# Patient Record
Sex: Female | Born: 1960
Health system: Southern US, Community
[De-identification: ages and names within clinical notes are randomized; demographics above are authoritative.]

## PROBLEM LIST (undated history)

## (undated) ENCOUNTER — Ambulatory Visit (HOSPITAL_COMMUNITY): Admission: EM | Payer: Self-pay | Source: Home / Self Care

## (undated) DIAGNOSIS — J302 Other seasonal allergic rhinitis: Secondary | ICD-10-CM

## (undated) DIAGNOSIS — N289 Disorder of kidney and ureter, unspecified: Secondary | ICD-10-CM

## (undated) DIAGNOSIS — I10 Essential (primary) hypertension: Secondary | ICD-10-CM

## (undated) HISTORY — PX: TUBAL LIGATION: SHX77

---

## 2005-10-21 ENCOUNTER — Emergency Department (HOSPITAL_COMMUNITY): Admission: EM | Admit: 2005-10-21 | Discharge: 2005-10-21 | Payer: Self-pay | Admitting: Family Medicine

## 2006-05-24 ENCOUNTER — Emergency Department (HOSPITAL_COMMUNITY): Admission: EM | Admit: 2006-05-24 | Discharge: 2006-05-24 | Payer: Self-pay | Admitting: Family Medicine

## 2006-05-24 ENCOUNTER — Ambulatory Visit (HOSPITAL_COMMUNITY): Admission: RE | Admit: 2006-05-24 | Discharge: 2006-05-24 | Payer: Self-pay | Admitting: Family Medicine

## 2007-01-04 ENCOUNTER — Emergency Department (HOSPITAL_COMMUNITY): Admission: EM | Admit: 2007-01-04 | Discharge: 2007-01-04 | Payer: Self-pay | Admitting: Family Medicine

## 2007-01-16 ENCOUNTER — Emergency Department (HOSPITAL_COMMUNITY): Admission: EM | Admit: 2007-01-16 | Discharge: 2007-01-16 | Payer: Self-pay | Admitting: Family Medicine

## 2007-04-22 ENCOUNTER — Emergency Department (HOSPITAL_COMMUNITY): Admission: EM | Admit: 2007-04-22 | Discharge: 2007-04-22 | Payer: Self-pay | Admitting: Emergency Medicine

## 2007-10-24 ENCOUNTER — Encounter: Admission: RE | Admit: 2007-10-24 | Discharge: 2007-10-24 | Payer: Self-pay | Admitting: Nephrology

## 2008-02-09 ENCOUNTER — Emergency Department (HOSPITAL_COMMUNITY): Admission: EM | Admit: 2008-02-09 | Discharge: 2008-02-09 | Payer: Self-pay | Admitting: Emergency Medicine

## 2008-05-12 ENCOUNTER — Emergency Department (HOSPITAL_COMMUNITY): Admission: EM | Admit: 2008-05-12 | Discharge: 2008-05-12 | Payer: Self-pay | Admitting: Emergency Medicine

## 2008-07-18 ENCOUNTER — Emergency Department (HOSPITAL_COMMUNITY): Admission: EM | Admit: 2008-07-18 | Discharge: 2008-07-18 | Payer: Self-pay | Admitting: Emergency Medicine

## 2008-10-27 ENCOUNTER — Emergency Department (HOSPITAL_COMMUNITY): Admission: EM | Admit: 2008-10-27 | Discharge: 2008-10-27 | Payer: Self-pay | Admitting: Family Medicine

## 2009-10-01 ENCOUNTER — Emergency Department (HOSPITAL_COMMUNITY): Admission: EM | Admit: 2009-10-01 | Discharge: 2009-10-01 | Payer: Self-pay | Admitting: Family Medicine

## 2009-11-19 ENCOUNTER — Emergency Department (HOSPITAL_COMMUNITY): Admission: EM | Admit: 2009-11-19 | Discharge: 2009-11-19 | Payer: Self-pay | Admitting: Family Medicine

## 2011-05-28 LAB — POCT URINALYSIS DIP (DEVICE)
Bilirubin Urine: NEGATIVE
Glucose, UA: NEGATIVE
Nitrite: NEGATIVE
Protein, ur: NEGATIVE
Specific Gravity, Urine: 1.025
Urobilinogen, UA: 0.2
pH: 5.5

## 2011-05-28 LAB — WET PREP, GENITAL
Clue Cells Wet Prep HPF POC: NONE SEEN
Yeast Wet Prep HPF POC: NONE SEEN

## 2011-06-02 LAB — POCT URINALYSIS DIP (DEVICE)
Glucose, UA: NEGATIVE mg/dL
Protein, ur: NEGATIVE mg/dL
Urobilinogen, UA: 0.2 mg/dL (ref 0.0–1.0)

## 2012-03-26 ENCOUNTER — Encounter (HOSPITAL_COMMUNITY): Payer: Self-pay | Admitting: *Deleted

## 2012-03-26 ENCOUNTER — Emergency Department (INDEPENDENT_AMBULATORY_CARE_PROVIDER_SITE_OTHER)
Admission: EM | Admit: 2012-03-26 | Discharge: 2012-03-26 | Disposition: A | Discharge status: Home / Self Care | Payer: Self-pay | Source: Home / Self Care | Attending: Family Medicine | Admitting: Family Medicine

## 2012-03-26 DIAGNOSIS — K648 Other hemorrhoids: Secondary | ICD-10-CM

## 2012-03-26 MED ORDER — HYDROCORTISONE ACE-PRAMOXINE 1-1 % RE CREA: TOPICAL_CREAM | Freq: Two times a day (BID) | RECTAL | Status: AC

## 2012-03-26 MED ORDER — POLYETHYLENE GLYCOL 3350 17 GM/SCOOP PO POWD: 17.0000 g | Freq: Every day | ORAL | Status: AC

## 2012-03-26 NOTE — ED Notes (Signed)
Intermittent blood stool onset x 10 - 14 days - seen x 2  this week - denies constipation - bright red blood - denies pain

## 2012-03-26 NOTE — ED Provider Notes (Signed)
History     CSN: [id number]  Arrival date & time 03/26/12  1422   First MD Initiated Contact with Patient 03/26/12 1607      Chief Complaint  Patient presents with  . Rectal Bleeding    (Consider location/radiation/quality/duration/timing/severity/associated sxs/prior treatment) HPI Comments: 51 year old female with no significant past medical history. Comments complaining of stools stained with red blood streaks intermittently during the last 2 weeks. Denies straining or constipation. Has had a history of hemorrhoids in the past. Denies melena. Denies history of heavy alcohol intake, hematemesis or GI ulcers. Denies constant rectal pain or rectal pain with defecation. Has had intermittent rectal itchiness in the past but none during the last week. No weight loss. State her stools are soft and brown. No changes in her stool pattern. No abdominal pain.    History reviewed. No pertinent past medical history.  Past Surgical History  Procedure Date  . Tubal ligation     History reviewed. No pertinent family history.  History  Substance Use Topics  . Smoking status: Never Smoker   . Smokeless tobacco: Not on file  . Alcohol Use: Yes    OB History    Grav Para Term Preterm Abortions TAB SAB Ect Mult Living                  Review of Systems  Constitutional: Negative for fever, chills, appetite change and fatigue.       10 systems reviewed and  pertinent negative and positive symptoms are as per HPI.     Gastrointestinal: Positive for blood in stool and hematochezia. Negative for nausea, vomiting, abdominal pain, diarrhea, constipation and abdominal distention.  Genitourinary: Negative for dysuria and hematuria.  Skin: Negative for rash.  Neurological: Negative for dizziness and headaches.  All other systems reviewed and are negative.    Allergies  Review of patient's allergies indicates no known allergies.  Home Medications   Current Outpatient Rx  Name Route Sig  Dispense Refill  . POLYETHYLENE GLYCOL 3350 PO POWD Oral Take 17 g by mouth daily. 255 g 0  . HYDROCORTISONE ACE-PRAMOXINE 1-1 % RE CREA Rectal Place rectally 2 (two) times daily. 30 g 0    BP 165/92  Pulse 73  Temp 98.2 F (36.8 C) (Oral)  Resp 18  SpO2 100%  Physical Exam  Nursing note and vitals reviewed. Constitutional: She is oriented to person, place, and time. She appears well-developed and well-nourished. No distress.  HENT:  Head: Normocephalic and atraumatic.  Eyes: Conjunctivae are normal. Pupils are equal, round, and reactive to light. No scleral icterus.  Neck: Neck supple. No JVD present. No thyromegaly present.  Cardiovascular: Normal heart sounds.   Pulmonary/Chest: Effort normal and breath sounds normal.  Abdominal: Soft. Bowel sounds are normal. She exhibits no distension and no mass. There is no tenderness. There is no rebound and no guarding.  Genitourinary: Rectal exam shows external hemorrhoid and internal hemorrhoid. Rectal exam shows no fissure, no mass, no tenderness and anal tone normal. Guaiac negative stool.       Impress small  Internal hemorrhoid felt on digital rectal exam. Was not able to visualized internal hemorrhoid, bleeding points or fissures on anoscopy. Small non inflamed external hemorrhoid/skin tag at 5 O'clock.   Lymphadenopathy:    She has no cervical adenopathy.  Neurological: She is alert and oriented to person, place, and time.  Skin: No rash noted.    ED Course  Procedures (including critical care time)  Labs Reviewed -  No data to display No results found.   1. Internal hemorrhoid       MDM  Impress internal hemorrhoid on digital examination. I was not able to see that on anoscopy. No fissures. One small noninflamed external hemorrhoid observed. Encouraged healthy balanced diet, fiber rich diet, stool softener as needed. Propoxyphene for anal pruritus when necessary. GI specialist contact information provided to followup as  needed.        Randa Spike, MD 03/27/12 1143

## 2012-03-30 ENCOUNTER — Telehealth (HOSPITAL_COMMUNITY): Payer: Self-pay | Admitting: *Deleted

## 2012-03-30 NOTE — ED Notes (Signed)
[facility] faxed a note asking to switch Procotcream HC to Proctofoam aerosol.  Shown to Dr. Milinda Antis and she said it was OK to switch.  I wrote this on the note and faxed it back to the pharmacist and confirmation received. Roselyn Meier 03/30/2012

## 2012-11-10 ENCOUNTER — Encounter (HOSPITAL_COMMUNITY): Payer: Self-pay | Admitting: *Deleted

## 2012-11-10 ENCOUNTER — Emergency Department (INDEPENDENT_AMBULATORY_CARE_PROVIDER_SITE_OTHER)
Admission: EM | Admit: 2012-11-10 | Discharge: 2012-11-10 | Disposition: A | Discharge status: Home / Self Care | Payer: Self-pay | Source: Home / Self Care

## 2012-11-10 DIAGNOSIS — M79605 Pain in left leg: Secondary | ICD-10-CM

## 2012-11-10 DIAGNOSIS — M79609 Pain in unspecified limb: Secondary | ICD-10-CM

## 2012-11-10 MED ORDER — KETOROLAC TROMETHAMINE 60 MG/2ML IM SOLN
30.0000 mg | Freq: Once | INTRAMUSCULAR | Status: AC
  Administered 2012-11-10: 30 mg via INTRAMUSCULAR

## 2012-11-10 MED ORDER — METHYLPREDNISOLONE 4 MG PO KIT: PACK | ORAL | Status: DC

## 2012-11-10 MED ORDER — HYDROCODONE-ACETAMINOPHEN 5-325 MG PO TABS: 1.0000 | ORAL_TABLET | ORAL | Status: DC | PRN

## 2012-11-10 NOTE — ED Notes (Signed)
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

## 2012-11-10 NOTE — ED Provider Notes (Signed)
History     CSN: [id number]  Arrival date & time 11/10/12  1220   First MD Initiated Contact with Patient 11/10/12 1254      Chief Complaint  Patient presents with  . Leg Pain    (Consider location/radiation/quality/duration/timing/severity/associated sxs/prior treatment) HPI Comments: 52 year old female presents with acute pain in the left leg beginning yesterday morning. She awoke with mild soreness in the left lateral thigh and lesser to the hip. In the following hours through the night last night and today the pain has worsened. Located primarily in the lateral thigh. The pain interferes with ambulation, getting into and out of the car, and other movements. The pain is rated ranging from 5-9/10 on the pain scale. The pain is improved with sitting and worse when supine and bearing weight. She describes it as a sharp and achy pain. She has no history of similar type pain. Denies back pain or tenderness. She denies history of trauma, fall, MVC or other known injury.   History reviewed. No pertinent past medical history.  Past Surgical History  Procedure Laterality Date  . Tubal ligation      No family history on file.  History  Substance Use Topics  . Smoking status: Never Smoker   . Smokeless tobacco: Not on file  . Alcohol Use: Yes    OB History   Grav Para Term Preterm Abortions TAB SAB Ect Mult Living                  Review of Systems  Constitutional: Negative for fever, chills and activity change.  HENT: Negative.   Respiratory: Negative.   Cardiovascular: Negative.   Musculoskeletal:       As per HPI  Skin: Negative for color change, pallor and rash.  Neurological: Negative.  Negative for tremors, speech difficulty, weakness, numbness and headaches.  Psychiatric/Behavioral: Negative.     Allergies  Review of patient's allergies indicates no known allergies.  Home Medications   Current Outpatient Rx  Name  Route  Sig  Dispense  Refill  .  HYDROcodone-acetaminophen (NORCO/VICODIN) 5-325 MG per tablet   Oral   Take 1 tablet by mouth every 4 (four) hours as needed for pain.   15 tablet   0   . methylPREDNISolone (MEDROL DOSEPAK) 4 MG tablet      follow package directions   21 tablet   0     BP 89/43  Pulse 76  Temp(Src) 98.1 F (36.7 C) (Oral)  Resp 20  SpO2 99%  Physical Exam  Nursing note and vitals reviewed. Constitutional: She is oriented to person, place, and time. She appears well-developed and well-nourished. No distress.  HENT:  Head: Normocephalic and atraumatic.  Eyes: EOM are normal.  Neck: Normal range of motion. Neck supple.  Cardiovascular: Normal rate.   Pulmonary/Chest: Effort normal. No respiratory distress.  Musculoskeletal:  Tenderness primarily in the lateral thigh musculature. Lesser or absent in the quadricep muscles. No tenderness in the hip joint, buttock or back. No bony tenderness. She is able to lift her leg off the bed when sitting produces mild pain in the area above. While supine passive range of motion produces mild pain however when the knee and hip is flexed and externally rotated this produces severe pain in the left lateral thigh. Distal neurovascular motor sensory is intact.  Neurological: She is alert and oriented to person, place, and time. No cranial nerve deficit.  Negative straight leg raise  Skin: Skin is warm and dry.  Psychiatric: She has a normal mood and affect.    ED Course  Procedures (including critical care time)  Labs Reviewed - No data to display No results found.   1. Leg pain, lateral, left   2. Left leg pain       MDM  There are neuropathic and musculoskeletal components to the pain in her left leg. No history of trauma. The source of pain may be in the low back versus a localized area of muscle or nerve pain. Next appointment with the [facility] as soon as possible. May need further studies to determine origin. May return for any new symptoms problems  or worsening Norco 5 mg one every 4 hours when necessary pain #15 Medrol Dosepak #21       Janne Napoleon, NP 11/10/12 1328

## 2012-11-11 NOTE — ED Provider Notes (Signed)
Medical screening examination/treatment/procedure(s) were performed by non-physician practitioner and as supervising physician I was immediately available for consultation/collaboration.  Philipp Deputy, M.D.  Harden Mo, MD 11/11/12 [phone]

## 2013-04-14 ENCOUNTER — Emergency Department (HOSPITAL_COMMUNITY)
Admission: EM | Admit: 2013-04-14 | Discharge: 2013-04-14 | Disposition: A | Discharge status: Home / Self Care | Payer: Self-pay | Attending: Emergency Medicine | Admitting: Emergency Medicine

## 2013-04-14 ENCOUNTER — Encounter (HOSPITAL_COMMUNITY): Payer: Self-pay | Admitting: Emergency Medicine

## 2013-04-14 DIAGNOSIS — J3489 Other specified disorders of nose and nasal sinuses: Secondary | ICD-10-CM | POA: Insufficient documentation

## 2013-04-14 DIAGNOSIS — J069 Acute upper respiratory infection, unspecified: Secondary | ICD-10-CM

## 2013-04-14 DIAGNOSIS — R05 Cough: Secondary | ICD-10-CM | POA: Insufficient documentation

## 2013-04-14 DIAGNOSIS — R059 Cough, unspecified: Secondary | ICD-10-CM | POA: Insufficient documentation

## 2013-04-14 MED ORDER — SALINE SPRAY 0.65 % NA SOLN: 1.0000 | NASAL | Status: DC | PRN

## 2013-04-14 NOTE — ED Provider Notes (Signed)
CSN: [id number]     Arrival date & time 04/14/13  1107 History     First MD Initiated Contact with Patient 04/14/13 1117     Chief Complaint  Patient presents with  . Otalgia   (Consider location/radiation/quality/duration/timing/severity/associated sxs/prior Treatment) HPI Comments: Patient is a 52 year old female presented to the emergency department with 2 days of worsening dry cough, nasal congestion with rhinorrhea, intermittent sharp right ear pain. Patient rates her ear pain at worst 10 out of 10. She denies any drainage from her ear. She denies any alleviating or aggravating factors. She denies trying any OTC medications for cough and cold symptoms. She denies any fevers or chills.  Patient is a 52 y.o. female presenting with ear pain.  Otalgia Associated symptoms: congestion, cough and rhinorrhea   Associated symptoms: no ear discharge, no fever, no headaches and no sore throat     History reviewed. No pertinent past medical history. Past Surgical History  Procedure Laterality Date  . Tubal ligation    . Tubal ligation     No family history on file. History  Substance Use Topics  . Smoking status: Never Smoker   . Smokeless tobacco: Not on file  . Alcohol Use: Yes   OB History   Grav Para Term Preterm Abortions TAB SAB Ect Mult Living                 Review of Systems  Constitutional: Negative for fever.  HENT: Positive for ear pain, congestion and rhinorrhea. Negative for sore throat and ear discharge.   Respiratory: Positive for cough. Negative for shortness of breath.   Cardiovascular: Negative for chest pain.  Neurological: Negative for headaches.    Allergies  Review of patient's allergies indicates no known allergies.  Home Medications   Current Outpatient Rx  Name  Route  Sig  Dispense  Refill  . sodium chloride (OCEAN) 0.65 % SOLN nasal spray   Nasal   Place 1 spray into the nose as needed for congestion.   60 mL   0    BP 147/98  Pulse 93   Temp(Src) 98 F (36.7 C) (Oral)  SpO2 96% Physical Exam  Constitutional: She is oriented to person, place, and time. She appears well-developed and well-nourished. No distress.  HENT:  Head: Normocephalic and atraumatic.  Right Ear: Tympanic membrane and external ear normal. No mastoid tenderness. Tympanic membrane is not injected, not scarred, not perforated, not erythematous, not retracted and not bulging. Tympanic membrane mobility is normal.  Left Ear: Tympanic membrane and external ear normal. No mastoid tenderness. Tympanic membrane is not injected, not scarred, not perforated, not erythematous, not retracted and not bulging. Tympanic membrane mobility is normal.  Nose: Nose normal.  Mouth/Throat: Oropharynx is clear and moist. No oropharyngeal exudate.  Eyes: Conjunctivae are normal.  Neck: Normal range of motion. Neck supple.  Pulmonary/Chest: Effort normal and breath sounds normal. No respiratory distress.  Lymphadenopathy:    She has no cervical adenopathy.  Neurological: She is alert and oriented to person, place, and time.  Skin: Skin is warm and dry. She is not diaphoretic.  Psychiatric: She has a normal mood and affect.    ED Course   Procedures (including critical care time)  Labs Reviewed - No data to display No results found. 1. URI (upper respiratory infection)     MDM  Patients symptoms are consistent with URI, likely viral etiology. Discussed that antibiotics are not indicated for viral infections. Pt will be discharged with  symptomatic treatment.  Verbalizes understanding and is agreeable with plan. Pt is hemodynamically stable & in NAD prior to dc. Patient was advised to follow up with PCP in 1-2 days. Patient is agreeable to plan. Patient is stable at time of discharge      Harlow Mares, PA-C 04/14/13 1431

## 2013-04-14 NOTE — ED Notes (Signed)
Started 2 days ago with URI and now c/o right ear pain, "sharp" at times.

## 2013-04-14 NOTE — ED Provider Notes (Signed)
Medical screening examination/treatment/procedure(s) were performed by non-physician practitioner and as supervising physician I was immediately available for consultation/collaboration.  Virgel Manifold, MD 04/14/13 [phone]

## 2013-11-14 ENCOUNTER — Encounter (HOSPITAL_COMMUNITY): Payer: Self-pay | Admitting: Emergency Medicine

## 2013-11-14 ENCOUNTER — Emergency Department (HOSPITAL_COMMUNITY)
Admission: EM | Admit: 2013-11-14 | Discharge: 2013-11-14 | Disposition: A | Discharge status: Home / Self Care | Payer: Self-pay | Attending: Emergency Medicine | Admitting: Emergency Medicine

## 2013-11-14 DIAGNOSIS — R030 Elevated blood-pressure reading, without diagnosis of hypertension: Secondary | ICD-10-CM | POA: Insufficient documentation

## 2013-11-14 DIAGNOSIS — R21 Rash and other nonspecific skin eruption: Secondary | ICD-10-CM | POA: Insufficient documentation

## 2013-11-14 DIAGNOSIS — IMO0001 Reserved for inherently not codable concepts without codable children: Secondary | ICD-10-CM

## 2013-11-14 MED ORDER — TERBINAFINE HCL 1 % EX CREA: 1.0000 "application " | TOPICAL_CREAM | Freq: Two times a day (BID) | CUTANEOUS | Status: DC

## 2013-11-14 NOTE — ED Provider Notes (Signed)
CSN: [id number]     Arrival date & time 11/14/13  1608 History  This chart was scribed for non-physician practitioner, Jeannett Senior, PA-C working with Ezequiel Essex, MD by Frederich Balding, ED scribe. This patient was seen in room TR08C/TR08C and the patient's care was started at 5:54 PM.    Chief Complaint  Patient presents with  . Rash   The history is provided by the patient. No language interpreter was used.   HPI Comments: Rebecca Marquez is a 53 y.o. female who presents to the Emergency Department complaining of a worsening dry, scaly rash that started 4 weeks ago. She states it started on her lower back and is now on her abdomen and arms. The rash only itches when she sweats. Pt has been using an OTC anti-itch cream with no relief. She states her coworker has a similar rash. Denies history of hypertension.  History reviewed. No pertinent past medical history. Past Surgical History  Procedure Laterality Date  . Tubal ligation    . Tubal ligation     No family history on file. History  Substance Use Topics  . Smoking status: Never Smoker   . Smokeless tobacco: Not on file  . Alcohol Use: Yes   OB History   Grav Para Term Preterm Abortions TAB SAB Ect Mult Living                 Review of Systems  Skin: Positive for rash.  All other systems reviewed and are negative.   Allergies  Review of patient's allergies indicates no known allergies.  Home Medications   Current Outpatient Rx  Name  Route  Sig  Dispense  Refill  . sodium chloride (OCEAN) 0.65 % SOLN nasal spray   Nasal   Place 1 spray into the nose as needed for congestion.   60 mL   0    BP 175/118  Pulse 107  Temp(Src) 98.5 F (36.9 C) (Oral)  Resp 18  SpO2 98%  Physical Exam  Nursing note and vitals reviewed. Constitutional: She is oriented to person, place, and time. She appears well-developed and well-nourished. No distress.  HENT:  Head: Normocephalic and atraumatic.  No rash over oral  mucosa  Eyes: EOM are normal.  Neck: Neck supple. No tracheal deviation present.  Cardiovascular: Normal rate.   Pulmonary/Chest: Effort normal. No respiratory distress.  Musculoskeletal: Normal range of motion.  Neurological: She is alert and oriented to person, place, and time.  Skin: Skin is warm and dry.  Scaly, hyperpigmented, annular rash to the abdomen and lower back with few spots over bilateral forearms. No palms or soles involvement  Psychiatric: She has a normal mood and affect. Her behavior is normal.    ED Course  Procedures (including critical care time)  DIAGNOSTIC STUDIES: Oxygen Saturation is 98% on RA, normal by my interpretation.    COORDINATION OF CARE: 5:58 PM-Discussed treatment plan which includes an anti-fungal cream with pt at bedside and pt agreed to plan.   Labs Review Labs Reviewed - No data to display Imaging Review No results found.   EKG Interpretation None      MDM   Final diagnoses:  Rash  Elevated blood pressure    Patient in emergency department with a scaly, annular, hyperpigmented rash to the lower abdomen and lower back. Rash is suspicious for an use corporis. It is however not extremely itchy. I have considered this to be pityriasis rosea or eczema rash. We'll continue hydrocortisone cream, Benadryl  cream, and we'll add Lamisil. She is to followup with her primary care Dr.  Her blood pressure is elevated in emergency department. She does not have history of hypertension. She is asymptomatic for her blood pressure. Have instructed her to followup with her doctor in 2 weeks to recheck it. She currently does not have a primary care doctor, and i recommended her several places.   Filed Vitals:   11/14/13 1626 11/14/13 1805  BP: 175/118 178/113  Pulse: 107 76  Temp: 98.5 F (36.9 C) 98.7 F (37.1 C)  TempSrc: Oral Oral  Resp: 18 17  SpO2: 98% 100%    I personally performed the services described in this documentation, which was  scribed in my presence. The recorded information has been reviewed and is accurate.   Renold Genta, PA-C 11/14/13 1811

## 2013-11-14 NOTE — Discharge Instructions (Signed)
Your rash is either fungal or due to pityriasis rosea. Apply current cream twice a day. Add lamisil cream. Also use moisturizing lotion after bathing daily. Avoid scratching it. Take benadryl orally as needed for itching. Follow up with your doctor for recheck.    Rash A rash is a change in the color or texture of your skin. There are many different types of rashes. You may have other problems that accompany your rash. CAUSES   Infections.  Allergic reactions. This can include allergies to pets or foods.  Certain medicines.  Exposure to certain chemicals, soaps, or cosmetics.  Heat.  Exposure to poisonous plants.  Tumors, both cancerous and noncancerous. SYMPTOMS   Redness.  Scaly skin.  Itchy skin.  Dry or cracked skin.  Bumps.  Blisters.  Pain. DIAGNOSIS  Your caregiver may do a physical exam to determine what type of rash you have. A skin sample (biopsy) may be taken and examined under a microscope. TREATMENT  Treatment depends on the type of rash you have. Your caregiver may prescribe certain medicines. For serious conditions, you may need to see a skin doctor (dermatologist). HOME CARE INSTRUCTIONS   Avoid the substance that caused your rash.  Do not scratch your rash. This can cause infection.  You may take cool baths to help stop itching.  Only take over-the-counter or prescription medicines as directed by your caregiver.  Keep all follow-up appointments as directed by your caregiver. SEEK IMMEDIATE MEDICAL CARE IF:  You have increasing pain, swelling, or redness.  You have a fever.  You have new or severe symptoms.  You have body aches, diarrhea, or vomiting.  Your rash is not better after 3 days. MAKE SURE YOU:  Understand these instructions.  Will watch your condition.  Will get help right away if you are not doing well or get worse. Document Released: 08/07/2002 Document Revised: 11/09/2011 Document Reviewed: 06/01/2011 [facility] Patient  Information 2014 [place], [place].

## 2013-11-14 NOTE — ED Notes (Signed)
Pt is here with dry scaly rash that started on lower back spread to abdomen and then to arms.

## 2013-11-14 NOTE — ED Notes (Signed)
Pt denies SOB, airway is patent without swelling at this time.

## 2013-11-14 NOTE — ED Provider Notes (Signed)
Medical screening examination/treatment/procedure(s) were performed by non-physician practitioner and as supervising physician I was immediately available for consultation/collaboration.   EKG Interpretation None        Ezequiel Essex, MD 11/14/13 [phone]

## 2013-11-15 ENCOUNTER — Observation Stay (HOSPITAL_COMMUNITY)
Admission: EM | Admit: 2013-11-15 | Discharge: 2013-11-16 | Disposition: A | Discharge status: Home / Self Care | Payer: Self-pay | Attending: Internal Medicine | Admitting: Internal Medicine

## 2013-11-15 ENCOUNTER — Encounter (HOSPITAL_COMMUNITY): Payer: Self-pay | Admitting: Emergency Medicine

## 2013-11-15 DIAGNOSIS — I129 Hypertensive chronic kidney disease with stage 1 through stage 4 chronic kidney disease, or unspecified chronic kidney disease: Principal | ICD-10-CM | POA: Insufficient documentation

## 2013-11-15 DIAGNOSIS — I161 Hypertensive emergency: Secondary | ICD-10-CM

## 2013-11-15 DIAGNOSIS — A5900 Urogenital trichomoniasis, unspecified: Secondary | ICD-10-CM | POA: Insufficient documentation

## 2013-11-15 DIAGNOSIS — N189 Chronic kidney disease, unspecified: Secondary | ICD-10-CM | POA: Insufficient documentation

## 2013-11-15 DIAGNOSIS — Z87891 Personal history of nicotine dependence: Secondary | ICD-10-CM | POA: Insufficient documentation

## 2013-11-15 DIAGNOSIS — A599 Trichomoniasis, unspecified: Secondary | ICD-10-CM

## 2013-11-15 DIAGNOSIS — I10 Essential (primary) hypertension: Secondary | ICD-10-CM

## 2013-11-15 DIAGNOSIS — N184 Chronic kidney disease, stage 4 (severe): Secondary | ICD-10-CM

## 2013-11-15 DIAGNOSIS — B354 Tinea corporis: Secondary | ICD-10-CM | POA: Insufficient documentation

## 2013-11-15 DIAGNOSIS — E785 Hyperlipidemia, unspecified: Secondary | ICD-10-CM | POA: Insufficient documentation

## 2013-11-15 HISTORY — DX: Other seasonal allergic rhinitis: J30.2

## 2013-11-15 HISTORY — DX: Essential (primary) hypertension: I10

## 2013-11-15 LAB — URINALYSIS, ROUTINE W REFLEX MICROSCOPIC
Bilirubin Urine: NEGATIVE
GLUCOSE, UA: NEGATIVE mg/dL
KETONES UR: NEGATIVE mg/dL
NITRITE: POSITIVE — AB
PROTEIN: 100 mg/dL — AB
SPECIFIC GRAVITY, URINE: 1.02 (ref 1.005–1.030)
UROBILINOGEN UA: 0.2 mg/dL (ref 0.0–1.0)
pH: 6 (ref 5.0–8.0)

## 2013-11-15 LAB — BASIC METABOLIC PANEL
BUN: 27 mg/dL — AB (ref 6–23)
CALCIUM: 9.2 mg/dL (ref 8.4–10.5)
CO2: 24 mEq/L (ref 19–32)
CREATININE: 2.07 mg/dL — AB (ref 0.50–1.10)
Chloride: 102 mEq/L (ref 96–112)
GFR calc non Af Amer: 27 mL/min — ABNORMAL LOW (ref >90)
GFR, EST AFRICAN AMERICAN: 31 mL/min — AB (ref >90)
Glucose, Bld: 94 mg/dL (ref 70–99)
Potassium: 3.6 mEq/L — ABNORMAL LOW (ref 3.7–5.3)
Sodium: 139 mEq/L (ref 137–147)

## 2013-11-15 LAB — URINE MICROSCOPIC-ADD ON

## 2013-11-15 LAB — CBC
HCT: 35 % — ABNORMAL LOW (ref 36.0–46.0)
Hemoglobin: 12.4 g/dL (ref 12.0–15.0)
MCH: 32.2 pg (ref 26.0–34.0)
MCHC: 35.4 g/dL (ref 30.0–36.0)
MCV: 90.9 fL (ref 78.0–100.0)
Platelets: 227 10*3/uL (ref 150–400)
RBC: 3.85 MIL/uL — AB (ref 3.87–5.11)
RDW: 15.2 % (ref 11.5–15.5)
WBC: 4.3 10*3/uL (ref 4.0–10.5)

## 2013-11-15 LAB — HEPATIC FUNCTION PANEL
ALK PHOS: 124 U/L — AB (ref 39–117)
ALT: 14 U/L (ref 0–35)
AST: 20 U/L (ref 0–37)
Albumin: 3.2 g/dL — ABNORMAL LOW (ref 3.5–5.2)
Bilirubin, Direct: <0.2 mg/dL (ref 0.0–0.3)
TOTAL PROTEIN: 6.9 g/dL (ref 6.0–8.3)
Total Bilirubin: 0.3 mg/dL (ref 0.3–1.2)

## 2013-11-15 LAB — CREATININE, URINE, RANDOM: Creatinine, Urine: 186.37 mg/dL

## 2013-11-15 LAB — SODIUM, URINE, RANDOM: Sodium, Ur: 115 mEq/L

## 2013-11-15 LAB — PREGNANCY, URINE: Preg Test, Ur: NEGATIVE

## 2013-11-15 LAB — TSH: TSH: 0.636 u[IU]/mL (ref 0.350–4.500)

## 2013-11-15 MED ORDER — ACETAMINOPHEN 650 MG RE SUPP: 650.0000 mg | Freq: Four times a day (QID) | RECTAL | Status: DC | PRN

## 2013-11-15 MED ORDER — ONDANSETRON HCL 4 MG/2ML IJ SOLN: 4.0000 mg | Freq: Three times a day (TID) | INTRAMUSCULAR | Status: DC | PRN

## 2013-11-15 MED ORDER — METOPROLOL TARTRATE 1 MG/ML IV SOLN
10.0000 mg | Freq: Four times a day (QID) | INTRAVENOUS | Status: DC
  Administered 2013-11-15 – 2013-11-16 (×4): 10 mg via INTRAVENOUS
  Filled 2013-11-15 (×8): qty 10

## 2013-11-15 MED ORDER — POTASSIUM CHLORIDE CRYS ER 20 MEQ PO TBCR
40.0000 meq | EXTENDED_RELEASE_TABLET | Freq: Once | ORAL | Status: AC
  Administered 2013-11-15: 40 meq via ORAL
  Filled 2013-11-15: qty 2

## 2013-11-15 MED ORDER — HYDRALAZINE HCL 20 MG/ML IJ SOLN
5.0000 mg | INTRAMUSCULAR | Status: DC | PRN
  Administered 2013-11-15: 5 mg via INTRAVENOUS
  Filled 2013-11-15: qty 1

## 2013-11-15 MED ORDER — METRONIDAZOLE 500 MG PO TABS
2000.0000 mg | ORAL_TABLET | Freq: Once | ORAL | Status: AC
  Administered 2013-11-15: 2000 mg via ORAL
  Filled 2013-11-15: qty 4

## 2013-11-15 MED ORDER — ONDANSETRON HCL 4 MG/2ML IJ SOLN
4.0000 mg | Freq: Three times a day (TID) | INTRAMUSCULAR | Status: DC | PRN
  Administered 2013-11-15: 4 mg via INTRAVENOUS
  Filled 2013-11-15: qty 2

## 2013-11-15 MED ORDER — SODIUM CHLORIDE 0.9 % IV SOLN
INTRAVENOUS | Status: AC
  Administered 2013-11-15: 10:00:00 via INTRAVENOUS

## 2013-11-15 MED ORDER — KETOCONAZOLE 2 % EX CREA
TOPICAL_CREAM | Freq: Every day | CUTANEOUS | Status: DC
  Administered 2013-11-15 – 2013-11-16 (×2): via TOPICAL
  Filled 2013-11-15 (×2): qty 15

## 2013-11-15 MED ORDER — ACETAMINOPHEN 325 MG PO TABS
650.0000 mg | ORAL_TABLET | Freq: Four times a day (QID) | ORAL | Status: DC | PRN
  Administered 2013-11-15 – 2013-11-16 (×4): 650 mg via ORAL
  Filled 2013-11-15 (×4): qty 2

## 2013-11-15 MED ORDER — HEPARIN SODIUM (PORCINE) 5000 UNIT/ML IJ SOLN
5000.0000 [IU] | Freq: Three times a day (TID) | INTRAMUSCULAR | Status: DC
Start: 2013-11-15 — End: 2013-11-16
  Administered 2013-11-15 – 2013-11-16 (×3): 5000 [IU] via SUBCUTANEOUS
  Filled 2013-11-15 (×6): qty 1

## 2013-11-15 MED ORDER — LABETALOL HCL 5 MG/ML IV SOLN
20.0000 mg | Freq: Once | INTRAVENOUS | Status: AC
  Administered 2013-11-15: 20 mg via INTRAVENOUS
  Filled 2013-11-15: qty 4

## 2013-11-15 NOTE — Progress Notes (Signed)
Patient arrived on unit from ED.  Vital signs stable.  Denies any questions or concerns.  Patient refuses flu and pneumonia vaccines at this time.  Will continue to monitor.

## 2013-11-15 NOTE — ED Provider Notes (Signed)
CSN: [id number]     Arrival date & time 11/15/13  [id number] History   First MD Initiated Contact with Patient 11/15/13 [phone]     Chief Complaint  Patient presents with  . Hypertension     (Consider location/radiation/quality/duration/timing/severity/associated sxs/prior Treatment) Patient is a 53 y.o. female presenting with hypertension. The history is provided by the patient. No language interpreter was used.  Hypertension Pertinent negatives include no abdominal pain, chest pain, chills, fatigue, headaches, nausea, numbness, vomiting or weakness.  This is a 52yo AAF w/ no significant PMH who presents to ED w/ c/o elevated blood pressure this morning. Patient was here in the ED yesterday for a rash and found to have blood pressure 175/118. She was asymptomatic and told to follow up with her PCP for treatment of HTN. No hx of HTN. Patient reports she was at [facility] this morning and checked her blood pressure, which was 190s/137. She will not have insurance until April so is unable to be seen by PCP until that time, which is why she came to the ED. She denies CP, SOB, HA, vision changes, focal neurologic symptoms, lightheadedness, leg swelling, palpitations, N/V/D, abd pain. She does endorse having increased urinary frequency, denies dysuria or hematuria.    History reviewed. No pertinent past medical history. Past Surgical History  Procedure Laterality Date  . Tubal ligation    . Tubal ligation     No family history on file. History  Substance Use Topics  . Smoking status: Never Smoker   . Smokeless tobacco: Not on file  . Alcohol Use: Yes   OB History   Grav Para Term Preterm Abortions TAB SAB Ect Mult Living                 Review of Systems  Constitutional: Negative for chills and fatigue.  Eyes: Negative for visual disturbance.  Respiratory: Negative for shortness of breath.   Cardiovascular: Negative for chest pain, palpitations and leg swelling.  Gastrointestinal: Negative for  nausea, vomiting, abdominal pain and diarrhea.  Genitourinary: Positive for frequency. Negative for dysuria and hematuria.  Neurological: Negative for syncope, facial asymmetry, speech difficulty, weakness, light-headedness, numbness and headaches.  All other systems reviewed and are negative.      Allergies  Review of patient's allergies indicates no known allergies.  Home Medications   Current Outpatient Rx  Name  Route  Sig  Dispense  Refill  . terbinafine (LAMISIL AT) 1 % cream   Topical   Apply 1 application topically 2 (two) times daily.   30 g   0    BP 199/168  Pulse 107  Temp(Src) 97.8 F (36.6 C) (Oral)  Resp 18  Ht 5\' 3"  (1.6 m)  Wt 124 lb (56.246 kg)  BMI 21.97 kg/m2  SpO2 99% Physical Exam  Nursing note and vitals reviewed. Constitutional: She is oriented to person, place, and time. She appears well-developed and well-nourished. No distress.  HENT:  Head: Normocephalic and atraumatic.  Mouth/Throat: Oropharynx is clear and moist.  Eyes: Conjunctivae and EOM are normal. Pupils are equal, round, and reactive to light.  Neck: Normal range of motion. Neck supple.  Cardiovascular: Normal rate and regular rhythm.   Pulmonary/Chest: Effort normal and breath sounds normal.  Abdominal: Soft. Bowel sounds are normal. She exhibits no distension. There is no tenderness.  Musculoskeletal: She exhibits no edema.  Neurological: She is alert and oriented to person, place, and time. No cranial nerve deficit.  Skin: Skin is warm and dry. Rash  noted.  Erythematous annular macules/patches w/ scale on leading edge on abdomen and back  Psychiatric: She has a normal mood and affect.    ED Course  Procedures (including critical care time) Labs Review Labs Reviewed  URINALYSIS, ROUTINE W REFLEX MICROSCOPIC  BASIC METABOLIC PANEL   Imaging Review No results found.   EKG Interpretation None      MDM   This is a 53yo AAF w/ no PMH who presents with asymptomatic  hypertension. Neuro exam is nonfocal, so unlikely to have any neurological invomement. She does not have s/s ACS. Will obtain BMP to assess renal function. She c/o urinary frequency as an isolated GU complaint. Will check a UA.   8:56 AM Patient's blood pressure remains elevated to 160-170/116. Remains asymptomatic. Patient's BMP showed Cr 2.07, unknown baseline, but I suspect this represents AKI 2/2 hypertensive emergency given she has no other known comorbidities. No other signs of end organ damage. UA wnl. I spoke with Dr. Elnora Morrison with [facility], they agree to admit patient.   Rebecca Eaton, MD 11/15/13 [phone]

## 2013-11-15 NOTE — ED Notes (Signed)
Pt comes to [facility] with complaints of HTN 191/137 checked at [facility] at 0600. Now BP is 199/168. Denies HA or slurred speech. Pt ambulatory, steady gait, grip strong and equal bilaterally. Denies pain right now. A&OX4.

## 2013-11-15 NOTE — ED Notes (Signed)
Called to give report, put on hold X 6 mins.

## 2013-11-15 NOTE — Progress Notes (Signed)
MD notified that patient continues to experience headache and sinus pressure, despite PRN acetaminophen administration.  No additional orders placed at this time.  Will continue to monitor.

## 2013-11-15 NOTE — H&P (Signed)
Date: 11/15/2013               Patient Name:  Rebecca Marquez MRN: [id number]  DOB: 30-Oct-1960 Age / Sex: 53 y.o., female   PCP: No Pcp Per Patient         Medical Service: Internal Medicine Teaching Service         Attending Physician: Dr. Madilyn Fireman, MD    First Contact: Dr. Margart Sickles Pager: [phone]  Second Contact: Dr. Algis Liming Pager: [phone]       After Hours (After 5p/  First Contact Pager: [phone]  weekends / holidays): Second Contact Pager: [phone]   Chief Complaint: Hypertension  History of Present Illness: The patient is a 53 yo woman, with no known PMH, though with no PCP, presenting with hypertension.  The patient presented after discovering that she had a BP of 190/130's on a [employer] BP cuff.  She has no known history of HTN, but was seen in the ED for a rash yesterday, and was noted to have BP's in the 170's/110's.  The patient is presently asymptomatic, with no nausea, vomiting, abd pain, visual changes, or confusion.  She does note a mild bilateral frontal headache, which is similar to her chronic headaches, but which only started after arrival in the ED today.  In the ED, she was found to have a Cr of 2.1, with no prior comparison, but no reported history of renal disease.  She notes occasional NSAID use for headaches, but none in the last week.  She does note poor PO intake for the last 2-3 weeks due to loss of appetite secondary to stress.  Of note, the patient was seen in the ED one day prior to admission for a rash on her abdomen, chest, and back, described as a mildly itchy, scaly rash that has been progressively growing over the last 1 month.  Meds: Current Facility-Administered Medications  Medication Dose Route Frequency Provider Last Rate Last Dose  . ondansetron (ZOFRAN) injection 4 mg  4 mg Intravenous Q8H PRN Rebecca Eaton, MD       Current Outpatient Prescriptions  Medication Sig Dispense Refill  . terbinafine (LAMISIL AT) 1 % cream Apply 1 application  topically 2 (two) times daily.  30 g  0    Allergies: Allergies as of 11/15/2013  . (No Known Allergies)   History reviewed. No pertinent past medical history. Past Surgical History  Procedure Laterality Date  . Tubal ligation    . Tubal ligation     No family history on file. History   Social History  . Marital Status: Legally Separated    Spouse Name: N/A    Number of Children: N/A  . Years of Education: N/A   Occupational History  . Not on file.   Social History Main Topics  . Smoking status: Former Smoker -- 0.50 packs/day for 20 years    Types: Cigarettes    Quit date: 08/31/2001  . Smokeless tobacco: Not on file  . Alcohol Use: No  . Drug Use: Yes    Special: Marijuana  . Sexual Activity: Not on file   Other Topics Concern  . Not on file   Social History Narrative  . No narrative on file    Review of Systems: General: no fevers, chills Skin: see HPI HEENT: no blurry vision, hearing changes, sore throat Pulm: no dyspnea, coughing, wheezing CV: no chest pain, palpitations, shortness of breath Abd: no abdominal pain, nausea/vomiting, diarrhea/constipation GU: +urinary frequency, +vaginal dryness,  no dysuria, hematuria, polyuria, vaginal itching/burning Ext: no arthralgias, myalgias Neuro: no weakness, numbness, or tingling  Physical Exam: Blood pressure 172/114, pulse 82, temperature 97.8 F (36.6 C), temperature source Oral, resp. rate 20, height 5\' 3"  (1.6 m), weight 124 lb (56.246 kg), SpO2 100.00%. General: alert, cooperative, and in no apparent distress HEENT: PERRL, EOMI, unable to fully visualize fundi Neck: supple Lungs: clear to ascultation bilaterally, normal work of respiration, no wheezes, rales, ronchi Heart: regular rate and rhythm, no murmurs, gallops, or rubs Abdomen: soft, non-tender, non-distended, normal bowel sounds.  Hyperpigmented, mildly raised, circular areas of scaling, with central clearing noted diffusely on abdomen and  back Extremities: no cyanosis, clubbing, or edema Neurologic: alert & oriented X3, cranial nerves II-XII intact, strength grossly intact, sensation intact to light touch   Lab results: Basic Metabolic Panel:  Recent Labs  11/15/13 0738  NA 139  K 3.6*  CL 102  CO2 24  GLUCOSE 94  BUN 27*  CREATININE 2.07*  CALCIUM 9.2   Urinalysis:  Recent Labs  11/15/13 0749  COLORURINE YELLOW  LABSPEC 1.020  PHURINE 6.0  GLUCOSEU NEGATIVE  HGBUR SMALL*  BILIRUBINUR NEGATIVE  KETONESUR NEGATIVE  PROTEINUR 100*  UROBILINOGEN 0.2  NITRITE POSITIVE*  LEUKOCYTESUR MODERATE*  Trichomonas present on UA  Imaging results:  No results found.  Other results: EKG: NSR, no acute ST abnormalities, LVH  Assessment & Plan by Problem:  # Accelerated Hypertension - The patient presents with BP max 181/116.  I suspect that this represents chronic hypertension, given lack of access to healthcare, similar values 1 day prior to admission, and EKG showing LVH.  The patient also has a Cr = 2.1, though it's unclear whether this is acute vs chronic (see below).  The patient has no other evidence of end-organ dysfunction. -goal BP reduction today is to around 145/93 (reduction of 20% from maximum) -s/p 20 mg labetalol in ED -start metoprolol 10 mg IV q6 -Hydralazine 5 mg q4 PRN only if SBP > 160 or DBP > 100 -monitor daily BMET's for Cr -can likely transition to PO BP medications in AM -check TSH  # AKI vs CKD - The patient presents with a Cr = 2.1, with no prior Cr on record.  AKI could represent prerenal azotemia (poor PO intake x2-3 weeks, though BUN/Cr < 20) vs hypertensive emergency (though BP not extremely elevated, and no other evidence of end-organ dysfunction).  Unlikely NSAID-induced (rare NSAID usage) vs post-renal (evidence of trichomonas, with urinary frequency, but unlikely to cause urinary retention).  More likely, this represents CKD secondary to long-standing HTN. -check FeNa -give  gentle IVF, given elevated BP.  Will start with NS at 75 cc/hr x12 hrs -monitor daily BMETs  # Rash - annular rash, with scaling, central clearing, and mild itching, is suspicious for tinea corporis. -treat empirically with topical antifungal  # Trichomoniasis - seen on UA. -will treat with flagyl 2g once -check urine pregnancy test prior to giving flagyl  # Prophy - heparin  Dispo: Disposition is deferred at this time, awaiting improvement of current medical problems. Anticipated discharge in approximately 1-2 day(s).   The patient does not have a current PCP (No Pcp Per Patient) and does not need an [facility] hospital follow-up appointment after discharge.  Signed: Hester Mates, MD 11/15/2013, 9:36 AM

## 2013-11-15 NOTE — Discharge Planning (Signed)
[id number] Felicia E, Community Liaison  Follow up appointment made with the [facility] for March 25,2015 at 5:00pm. Patient is aware of this appointment, my contact information provided for any future questions or concerns.

## 2013-11-15 NOTE — ED Notes (Signed)
Pt reports onset of HA. Requesting pain medication.

## 2013-11-15 NOTE — ED Notes (Signed)
Attempted to call reports on 3E, floor unaware they are getting a pt so they can't take report at this moment.

## 2013-11-15 NOTE — ED Notes (Signed)
phlebotomy at bedside.  

## 2013-11-15 NOTE — ED Notes (Signed)
Pt denies hx of smoking cigarettes. Admits to using marijuana everyday.

## 2013-11-15 NOTE — ED Provider Notes (Signed)
I saw and evaluated the patient, reviewed the resident's note and I agree with the findings and plan.   EKG Interpretation None      Pt with asymptomatic HTN, not taking any medications, no known history of HTN. Has Cr 2.0 unknown baseline. Will give Labetolol, admit for Hypertensive Emergency.   Charles B. Karle Starch, MD 11/15/13 [phone]

## 2013-11-15 NOTE — ED Notes (Signed)
Attempted to call report. Ginger, RN sts she is uncomfortable taking this pt d/t BP being high. Will page admitting physician.

## 2013-11-15 NOTE — Progress Notes (Signed)
Received report from [facility] in ED. Patient's B/P was trending up even after medication given. Not comfortable with patient being transferred here. Agricultural consultant and bed placement notified.

## 2013-11-16 ENCOUNTER — Telehealth: Payer: Self-pay | Admitting: Internal Medicine

## 2013-11-16 DIAGNOSIS — A599 Trichomoniasis, unspecified: Secondary | ICD-10-CM

## 2013-11-16 DIAGNOSIS — R21 Rash and other nonspecific skin eruption: Secondary | ICD-10-CM

## 2013-11-16 DIAGNOSIS — A5900 Urogenital trichomoniasis, unspecified: Secondary | ICD-10-CM

## 2013-11-16 DIAGNOSIS — N184 Chronic kidney disease, stage 4 (severe): Secondary | ICD-10-CM

## 2013-11-16 DIAGNOSIS — E785 Hyperlipidemia, unspecified: Secondary | ICD-10-CM

## 2013-11-16 DIAGNOSIS — I129 Hypertensive chronic kidney disease with stage 1 through stage 4 chronic kidney disease, or unspecified chronic kidney disease: Secondary | ICD-10-CM

## 2013-11-16 DIAGNOSIS — N189 Chronic kidney disease, unspecified: Secondary | ICD-10-CM

## 2013-11-16 LAB — HEMOGLOBIN A1C
Hgb A1c MFr Bld: 5.7 % — ABNORMAL HIGH (ref <5.7)
Mean Plasma Glucose: 117 mg/dL — ABNORMAL HIGH (ref <117)

## 2013-11-16 LAB — BASIC METABOLIC PANEL
BUN: 26 mg/dL — ABNORMAL HIGH (ref 6–23)
CALCIUM: 9.2 mg/dL (ref 8.4–10.5)
CO2: 20 meq/L (ref 19–32)
Chloride: 107 mEq/L (ref 96–112)
Creatinine, Ser: 2.24 mg/dL — ABNORMAL HIGH (ref 0.50–1.10)
GFR calc non Af Amer: 24 mL/min — ABNORMAL LOW (ref >90)
GFR, EST AFRICAN AMERICAN: 28 mL/min — AB (ref >90)
Glucose, Bld: 87 mg/dL (ref 70–99)
Potassium: 4.4 mEq/L (ref 3.7–5.3)
Sodium: 140 mEq/L (ref 137–147)

## 2013-11-16 LAB — LIPID PANEL
CHOL/HDL RATIO: 3.8 ratio
CHOLESTEROL: 188 mg/dL (ref 0–200)
HDL: 50 mg/dL (ref >39)
LDL Cholesterol: 123 mg/dL — ABNORMAL HIGH (ref 0–99)
Triglycerides: 77 mg/dL (ref <150)
VLDL: 15 mg/dL (ref 0–40)

## 2013-11-16 MED ORDER — ATORVASTATIN CALCIUM 40 MG PO TABS
40.0000 mg | ORAL_TABLET | Freq: Every day | ORAL | Status: DC
  Filled 2013-11-16: qty 1

## 2013-11-16 MED ORDER — FOSFOMYCIN TROMETHAMINE 3 G PO PACK
3.0000 g | PACK | Freq: Once | ORAL | Status: AC
  Administered 2013-11-16: 3 g via ORAL
  Filled 2013-11-16: qty 3

## 2013-11-16 MED ORDER — KETOCONAZOLE 2 % EX CREA: TOPICAL_CREAM | Freq: Every day | CUTANEOUS | Status: DC

## 2013-11-16 MED ORDER — AMLODIPINE BESYLATE 5 MG PO TABS
5.0000 mg | ORAL_TABLET | Freq: Every day | ORAL | Status: DC
  Administered 2013-11-16: 5 mg via ORAL
  Filled 2013-11-16: qty 1

## 2013-11-16 MED ORDER — HYDROCHLOROTHIAZIDE 25 MG PO TABS
25.0000 mg | ORAL_TABLET | Freq: Every day | ORAL | Status: DC
  Administered 2013-11-16: 25 mg via ORAL
  Filled 2013-11-16: qty 1

## 2013-11-16 MED ORDER — AMLODIPINE BESYLATE 5 MG PO TABS: 5.0000 mg | ORAL_TABLET | Freq: Every day | ORAL | Status: DC

## 2013-11-16 MED ORDER — ATORVASTATIN CALCIUM 40 MG PO TABS: 40.0000 mg | ORAL_TABLET | Freq: Every day | ORAL | Status: DC

## 2013-11-16 MED ORDER — HYDROCHLOROTHIAZIDE 25 MG PO TABS: 12.5000 mg | ORAL_TABLET | Freq: Every day | ORAL | Status: DC

## 2013-11-16 NOTE — Discharge Summary (Signed)
Name: Rebecca Marquez MRN: [id number] DOB: [birth year] 53 y.o. PCP: No Pcp Per Patient  Date of Admission: 11/15/2013  6:56 AM Date of Discharge: 11/16/2013 Attending Physician: Madilyn Fireman, MD  Discharge Diagnosis:  Principal Problem:   Hypertension, accelerated Active Problems:   HLD (hyperlipidemia)   Chronic kidney disease   UTI (urinary tract infection)   Trichomonal infection  Discharge Medications:   Medication List    STOP taking these medications       terbinafine 1 % cream  Commonly known as:  LAMISIL AT      TAKE these medications       amLODipine 5 MG tablet  Commonly known as:  NORVASC  Take 1 tablet (5 mg total) by mouth daily.     atorvastatin 40 MG tablet  Commonly known as:  LIPITOR  Take 1 tablet (40 mg total) by mouth daily at 6 PM.     hydrochlorothiazide 25 MG tablet  Commonly known as:  HYDRODIURIL  Take 0.5 tablets (12.5 mg total) by mouth daily.     ketoconazole 2 % cream  Commonly known as:  NIZORAL  Apply topically daily.        Disposition and follow-up:   Rebecca Marquez was discharged from [facility] in Good condition.  At the hospital follow up visit please address:  1.  CKD vs AKI, HTN, HLD, UTI, Trichomonas Infection  2.  Labs / imaging needed at time of follow-up: BMP  3.  Pending labs/ test needing follow-up: HgA1c  Follow-up Appointments:     Follow-up Information   Follow up with [facility]     On 11/22/2013. (5:00 pm)    Contact information:   [place] [phone]      Discharge Instructions: Discharge Orders   Future Appointments Provider Department Dept Phone   11/22/2013 5:00 PM [facility] Covering Provider [place] [phone]   Future Orders Complete By Expires   Call MD for:  difficulty breathing, headache or visual disturbances  As directed    Call MD for:  extreme fatigue  As  directed    Call MD for:  persistant dizziness or light-headedness  As directed    Call MD for:  severe uncontrolled pain  As directed    Diet - low sodium heart healthy  As directed    Discharge instructions  As directed    Comments:     We have diagnosed you with high blood pressure and kidney dysfunction.  Please take the blood pressure medicines as prescribed.  Please follow up next weds with [facility] center.       Admission HPI: The patient is a 53 yo woman, with no known PMH, though with no PCP, presenting with hypertension. The patient presented after discovering that she had a BP of 190/130's on a [employer] BP cuff. She has no known history of HTN, but was seen in the ED for a rash yesterday, and was noted to have BP's in the 170's/110's. The patient is presently asymptomatic, with no nausea, vomiting, abd pain, visual changes, or confusion. She does note a mild bilateral frontal headache, which is similar to her chronic headaches, but which only started after arrival in the ED today. In the ED, she was found to have a Cr of 2.1, with no prior comparison, but no reported history of renal disease. She notes occasional NSAID use for headaches,  but none in the last week. She does note poor PO intake for the last 2-3 weeks due to loss of appetite secondary to stress. Of note, the patient was seen in the ED one day prior to admission for a rash on her abdomen, chest, and back, described as a mildly itchy, scaly rash that has been progressively growing over the last 1 month.   Hospital Course by problem list: Principal Problem:   Hypertension, accelerated Active Problems:   HLD (hyperlipidemia)   Chronic kidney disease   UTI (urinary tract infection)   Trichomonal infection   AKI vs CKD  The patient hadrisk for AKI (HTN emergency and NSAID use with decreased PO intake). Furthermore, FeNa is less than 1% making pre-renal more likely. She also has risk fo CKD (chronic untreated  HTN). Cr was stable during admission. Likely due to CKD. Arrange follow up with [facility] for Cr check next week. I instructed the patient to avoid  NSAIDs and start takingh her new BP meds.   HTN, accelerated  The patients accelerated HTN resolved with IV metoprolol. Transitioned the patient to PO meds (amlodipine 5 mg qd and HCTZ 12.5 mg qd). Will likely need titration as outpatient.  HLD  LDL 123, start atorvastatin 40 mg.   Possible UTI Patient had Hg, mod leuks and nitrite on UA. Treated empirically with 3 g fosfomycin.  Trichomonal Infection  Positive trichomonads on UA. Gave 2 g flagyl x 1.  Rash The rash is consistent with fungal infection (tinea). Discharged with anti-fungal cream.   Discharge Vitals:   BP 145/87  Pulse 67  Temp(Src) 98.5 F (36.9 C) (Oral)  Resp 18  Ht 5' 3.5" (1.613 m)  Wt 124 lb 3.2 oz (56.337 kg)  BMI 21.65 kg/m2  SpO2 99%  Discharge Labs:  Results for orders placed during the hospital encounter of 11/15/13 (from the past 24 hour(s))  LIPID PANEL     Status: Abnormal   Collection Time    11/16/13  4:08 AM      Result Value Ref Range   Cholesterol 188  0 - 200 mg/dL   Triglycerides 77  <150 mg/dL   HDL 50  >39 mg/dL   Total CHOL/HDL Ratio 3.8     VLDL 15  0 - 40 mg/dL   LDL Cholesterol 123 (*) 0 - 99 mg/dL  BASIC METABOLIC PANEL     Status: Abnormal   Collection Time    11/16/13  5:18 AM      Result Value Ref Range   Sodium 140  137 - 147 mEq/L   Potassium 4.4  3.7 - 5.3 mEq/L   Chloride 107  96 - 112 mEq/L   CO2 20  19 - 32 mEq/L   Glucose, Bld 87  70 - 99 mg/dL   BUN 26 (*) 6 - 23 mg/dL   Creatinine, Ser 2.24 (*) 0.50 - 1.10 mg/dL   Calcium 9.2  8.4 - 10.5 mg/dL   GFR calc non Af Amer 24 (*) >90 mL/min   GFR calc Af Amer 28 (*) >90 mL/min    Signed: Marrion Coy, MD 11/16/2013, 11:07 AM   Time Spent on Discharge: 35 minutes Services Ordered on Discharge: None Equipment Ordered on Discharge: None

## 2013-11-16 NOTE — H&P (Signed)
INTERNAL MEDICINE TEACHING ATTENDING NOTE  Day 1 of stay  Patient name: Rebecca Marquez  MRN: [id number] Date of birth: [birth year]   53 y.o. admitted for accelerated hypertension and kidney failure, acute versus chronic. Patient doing well today, and offers no complaints. According to the patient, the last normal blood pressure that she had was more than 6 months ago. The patient also complains of a rash on her abdomen.   PHYSICAL EXAM   Vitals BP 145/87  HR 67  RR 18  SpO2 99 RA  Temp Normal  Pain 0   Exam General No acute distress, thin african [place] female.  HEENT PERRLA  Cardiac RRR, S1S2 normal, no murmurs  Respiratory Vesicular breath sounds, no abnormal sounds  Abdomen Soft, non tender  Neuro Alert and oriented  Extremities No pedal edema.   Labs and imaging reviewed.  Assessment and Plan   I agree with the assessment and plan of Dr. Ronnald Ramp with the following additions:  It appears that the patient's condition represents a chronic state, than acute.The patient's BP has already showed significant improvement, however her creatinine today showed an increasing trend.  Recent Labs Lab 11/15/13 0738 11/16/13 0518  BUN 27* 26*  CREATININE 2.07* 2.24*   There are no other acute issues that are present right now.Her urinary output has not decreased, and her increased creatinine from yesterday is still not high enough to fall in the category of AKI. GFR has only mildly decreased from yesterday. The patient wants to go home, her vitals look stable, and I think we can safely discharge her with follow up to our clinic where she will be evaluated regarding her worsening kidney function.   Discussed the plan with Dr. Margart Sickles, who reports that he has had a good talk with the patient about the importance of the clinic appointment, and stressed that she should not miss it, and the patient voices understanding.  I have seen and evaluated this patient and discussed it with my IM  resident team.  Please see the rest of the plan per resident note from today.   [place], [place] 11/16/2013, 10:44 AM.

## 2013-11-16 NOTE — Care Management Note (Signed)
    Page 1 of 1   11/16/2013     1:57:38 PM   CARE MANAGEMENT NOTE 11/16/2013  Patient:  Rebecca Marquez, Rebecca Marquez   Account Number:  [id number]  Date Initiated:  11/16/2013  Documentation initiated by:  Marvetta Gibbons  Subjective/Objective Assessment:   Pt admitted with HTN     Action/Plan:   PTA pt lived at home   Anticipated DC Date:  11/16/2013   Anticipated DC Plan:  [place]  CM consult  [place] Program  Medication Assistance      Choice offered to / List presented to:             Status of service:  Completed, signed off Medicare Important Message given?   (If response is "NO", the following Medicare IM given date fields will be blank) Date Medicare IM given:   Date Additional Medicare IM given:    Discharge Disposition:  HOME/SELF CARE  Per UR Regulation:  Reviewed for med. necessity/level of care/duration of stay  If discussed at [place] of Stay Meetings, dates discussed:    Comments:  11/16/13- 1145- Marvetta Gibbons RN, BSN [phone] Pt for d/c today- concern for medication assistance- in to speak with pt at bedside- per conversation pt states that she uses [facility]- looked at medication list- one med on $4 list- rest are not- pt states that she does have Marquez job now and will have insurance in about Marquez month- but is worried about the cost of meds at discharge- has an appointment next week on the 25th at the [facility] wellness clinic for f/u. Explained to pt about [place] program- will assist pt with [place] letter- pt understands cost per prescription will be $3- letter given to pt along with list of pharmacies. Pt voices understanding about program and that it is Marquez one time assistance- pt feels she will be able to get meds once insurance is active. Informed pt to seek assistance from clinic if insurance does not come through and she is starting to run out of her medications.

## 2013-11-16 NOTE — Progress Notes (Addendum)
Subjective:  Patient complains of mild HA that is similar to baseline. She attributes it to IV BP meds. She requests NSAID. Requests to go home today.  Objective: Vital signs in last 24 hours: Filed Vitals:   11/15/13 1747 11/15/13 2159 11/16/13 0530 11/16/13 0900  BP: 132/76 140/76 162/98 145/87  Pulse: 69 65 73 67  Temp:  97.8 F (36.6 C) 98.1 F (36.7 C) 98.5 F (36.9 C)  TempSrc:  Oral Oral Oral  Resp:  18 18   Height:      Weight:   124 lb 3.2 oz (56.337 kg)   SpO2: 98% 99% 100% 99%   Weight change:   Intake/Output Summary (Last 24 hours) at 11/16/13 0919 Last data filed at 11/16/13 0850  Gross per 24 hour  Intake    840 ml  Output    775 ml  Net     65 ml   Physical Exam  Constitutional: She is oriented to person, place, and time. She appears well-developed and well-nourished. No distress.  Cardiovascular: Normal rate, regular rhythm, normal heart sounds and intact distal pulses.  Exam reveals no friction rub.   No murmur heard. Abdominal: Soft. Bowel sounds are normal. She exhibits no distension. There is no tenderness. There is no rebound and no guarding.  Neurological: She is alert and oriented to person, place, and time.  Skin: She is not diaphoretic.  Rash similar to previous exam.  Psychiatric: She has a normal mood and affect. Her behavior is normal.    Lab Results: Basic Metabolic Panel:  Recent Labs Lab 11/15/13 0738 11/16/13 0518  NA 139 140  K 3.6* 4.4  CL 102 107  CO2 24 20  GLUCOSE 94 87  BUN 27* 26*  CREATININE 2.07* 2.24*  CALCIUM 9.2 9.2   Liver Function Tests:  Recent Labs Lab 11/15/13 1050  AST 20  ALT 14  ALKPHOS 124*  BILITOT 0.3  PROT 6.9  ALBUMIN 3.2*   CBC:  Recent Labs Lab 11/15/13 1050  WBC 4.3  HGB 12.4  HCT 35.0*  MCV 90.9  PLT 227   Fasting Lipid Panel:  Recent Labs Lab 11/16/13 0408  CHOL 188  HDL 50  LDLCALC 123*  TRIG 77  CHOLHDL 3.8   Thyroid Function Tests:  Recent Labs Lab  11/15/13 1050  TSH 0.636   Urinalysis:  Recent Labs Lab 11/15/13 0749  COLORURINE YELLOW  LABSPEC 1.020  PHURINE 6.0  GLUCOSEU NEGATIVE  HGBUR SMALL*  BILIRUBINUR NEGATIVE  KETONESUR NEGATIVE  PROTEINUR 100*  UROBILINOGEN 0.2  NITRITE POSITIVE*  LEUKOCYTESUR MODERATE*   Medications: I have reviewed the patient's current medications. Scheduled Meds: . amLODipine  5 mg Oral Daily  . atorvastatin  40 mg Oral q1800  . heparin  5,000 Units Subcutaneous 3 times per day  . hydrochlorothiazide  25 mg Oral Daily  . ketoconazole   Topical Daily   Continuous Infusions:  PRN Meds:.acetaminophen, acetaminophen, hydrALAZINE, ondansetron (ZOFRAN) IV Assessment/Plan: Active Problems:   Hypertension, accelerated  AKI vs CKD The patient has risk for AKI (HTN emergency and NSAID use with decreased PO intake). Furthermore, FeNa is less than 1% making pre-renal more likely. She also has risk fo CKD (chronic untreated HTN). Cr increased from 2.07 to 2.2 ON. Will arrange close followup in [facility] - FeNa = 0.9%, thus pre-renal etiology of AKI is likely. - treat HTN - no NSAIDs  HTN, accelerated The patients accelerated HTN is resolved. Transition to PO meds (amlodipine 5 mg  qd and HCTZ 12.5 mg qd). - HgA1c is pending.  HLD LDL 123, start atorvastatin 40 mg.  Trichomonal Infection - Likely improving, gave 2 g flagyl x 1.   Dispo: Disposition is deferred at this time, awaiting improvement of current medical problems.  Anticipated discharge in approximately 1 day(s).   The patient does not have a current PCP (No Pcp Per Patient) and does need an [facility] hospital follow-up appointment after discharge.  The patient does not have transportation limitations that hinder transportation to clinic appointments.  .Services Needed at time of discharge: Y = Yes, Blank = No PT:   OT:   RN:   Equipment:   Other:     LOS: 1 day   Marrion Coy, MD 11/16/2013, 9:19 AM

## 2013-11-16 NOTE — Discharge Instructions (Signed)
Emergency Department Resource Guide 1) Find a Doctor and Pay Out of Pocket Although you won't have to find out who is covered by your insurance plan, it is a good idea to ask around and get recommendations. You will then need to call the office and see if the doctor you have chosen will accept you as a new patient and what types of options they offer for patients who are self-pay. Some doctors offer discounts or will set up payment plans for their patients who do not have insurance, but you will need to ask so you aren't surprised when you get to your appointment.  2) Contact Your Local Health Department Not all health departments have doctors that can see patients for sick visits, but many do, so it is worth a call to see if yours does. If you don't know where your local health department is, you can check in your phone book. The CDC also has a tool to help you locate your state's health department, and many state websites also have listings of all of their local health departments.  3) Find a [facility] If your illness is not likely to be very severe or complicated, you may want to try a walk in clinic. These are popping up all over the country in pharmacies, drugstores, and shopping centers. They're usually staffed by nurse practitioners or physician assistants that have been trained to treat common illnesses and complaints. They're usually fairly quick and inexpensive. However, if you have serious medical issues or chronic medical problems, these are probably not your best option.  No Primary Care Doctor: - Call Health Connect at  [phone] - they can help you locate a primary care doctor that  accepts your insurance, provides certain services, etc. - Physician Referral Service- [phone]  Chronic Pain Problems: Organization         Address  Phone   Notes  [facility]  [phone] Patients need to be referred by their primary care doctor.   Medication  Assistance: Organization         Address  Phone   Notes  [facility] Medication [facility] [address] [place], [place] [address] [phone] --Must be a resident of [facility] -- Must have NO insurance coverage whatsoever (no Medicaid/ Medicare, etc.) -- The pt. MUST have a primary care doctor that directs their care regularly and follows them in the community   Med[facility]ist  [phone]   [employer]  [phone]    Agencies that provide inexpensive medical care: Organization         Address  Phone   Notes  [place]  [phone]   [facility]    [phone]   [facility] [place], [place] [address] [phone]   [place] [address] [place] [phone]   Planned Parenthood    [phone]   [facility]    [phone]   [place] [address], [place] Phone:  [phone], Fax:  [phone] Hours of Operation:  9 am - 6 pm, M-F.  Also accepts Medicaid/Medicare and self-pay.  [facility] for [address], [address], [place] Phone: [phone], Fax: [phone]. Hours of Operation:  8:30 am - 5:30 pm, M-F.  Also accepts Medicaid and self-pay.  [facility] [address], [place] Phone: [phone]   [place], [place], [place] [phone], Ext. [phone] Mondays & Thursdays: 7-9 AM.  First 15 patients are seen on a first come, first serve basis.    [place] Providers:  Organization         Address  Phone   Notes  [facility] [address], [place] [phone] Also accepts self-pay patients.  [facility] [id number] [place], [place]  [phone]   [place], [address], [place]  [phone]   [facility] [address], [place] [phone]   Lucianne Lei [address], [place]   [phone] Only accepts [place] Access [place] patients after they have their name applied to their card.   Self-Pay (no insurance) in [facility]:  Organization         Address  Phone   Notes  Sickle Cell Patients, [facility] Internal Medicine [place] [phone]   [facility] [place] [phone]   [facility] [place], [place], [place] [phone]   [facility]/Dr. Osei-Bonsu  [address] [place] or [address], [place] [phone] Phone number for both [place] and [place] locations is the same.  [facility]gent [facility] and [facility] [address] [place] [phone]   [facility] [address] [place] or [address] [phone]   [facility] [address] [place] [phone], phone; [phone], fax Sees patients 1st and 3rd Saturday of every month.  Must not qualify for public or private insurance (i.e. Medicaid, Medicare, [place] Health Choice, Veterans' Benefits)  Household income should be no more than 200% of the poverty level The clinic cannot treat you if you are pregnant or think you are pregnant  Sexually transmitted diseases are not treated at the clinic.   Dental Care: Organization         Address  Phone  Notes  [facility] Department of [facility] [place] [phone] Accepts children up to age 44 who are enrolled in [place] or [place]; pregnant women with a Medicaid card; and children who have applied for Medicaid or [place] Health Choice, but were declined, whose parents can pay a reduced fee at time of service.  [facility]  Department of [facility]  [address], [place] [phone] Accepts children up to age 25 who are enrolled in [place] or [place]; pregnant women with a Medicaid card; and children who have applied for Medicaid or [place] Health Choice, but were declined, whose parents can pay a reduced fee at time of service.  [place] Adult Dental Access PROGRAM  [place] [phone] Patients are seen by appointment only. Walk-ins are not accepted. [place] will see patients 32 years of age and older. Monday - Tuesday (8am-5pm) Most Wednesdays (8:30-5pm) $30 per visit, cash only  [facility] Adult Dental Access PROGRAM  [address], [facility] [phone] Patients are seen by appointment only. Walk-ins are not accepted. [place] will see patients 18 years of age and older. One Wednesday  Evening (Monthly: Volunteer Based).  $30 per visit, cash only  [place]  [phone] for adults; Children under age 61, call Graduate Pediatric Dentistry at [phone]. Children aged 59-14, please call [phone] to request a pediatric application.  Dental services are provided in all areas of dental care including fillings, crowns and bridges, complete and partial dentures, implants, gum treatment, root canals, and extractions. Preventive care is also provided. Treatment is provided to both adults and children. Patients are selected via a lottery and there is often a waiting list.   [facility] [address], [place]  [phone] [email or web address]   [facility] [address] [place], [place] [phone], Ext. [phone] Second and Fourth Thursday of each month, opens at 6:30 AM; Clinic ends at 9 AM.  Patients are seen on a first-come first-served basis, and a limited number are seen during each clinic.   [facility]  [address] [place], [place] [phone]    Eligibility Requirements You must have lived in [place], [place], or [place] counties for at least the last three months.   You cannot be eligible for state or federal sponsored [employer], including [employer], [place], or [employer].   You generally cannot be eligible for healthcare insurance through your employer.    How to apply: Eligibility screenings are held every Tuesday and Wednesday afternoon from 1:00 pm until 4:00 pm. You do not need an appointment for the interview!  [facility] [address], [place], [place]   [place]  [place] Department  [place]  [phone]    Behavioral Health Resources in the Community: Intensive Outpatient Programs Organization         Address  Phone  Notes  [place] [facility]. [address], [place], [place] [phone]   [facility] Outpatient [address], [place], [place]   ADS: [facility] [address] [place], [place]   [place] [address]  [place], [place] or [phone]   Substance Abuse Resources Organization         Address  Phone  Notes  Alcohol and Drug Services  [phone]   [place]  [phone]   The [place]   Chinita Pester  [phone]   Residential & Outpatient Substance Abuse Program  [phone]   Psychological Services Organization         Address  Phone  Notes  [facility] [place]  [place]  [phone]   [place] [address] [place] or [phone]    Mobile Crisis Teams Organization         Address  Phone  Notes  [facility], Mobile Crisis Care Unit  [phone]   [facility]  [address]  [place], [place]   [place] [address], [place] [phone]    Self-Help/Support Groups Organization         Address  Phone             Notes  [place]. of [place] - variety of support groups  [place] Call for more information  Narcotics Anonymous (NA), [facility] [address], [employer] [place]  2 meetings at this location  Residential Treatment Programs Organization         Address  Phone  Notes  [facility] [address]    [place]  [phone]   [facility]  [address], [place] [id number], [place], [place]   [place], [place] [phone] Admissions: 8am-3pm M-F  Incentives Substance [place] [address],    [place], [place] [phone]   [facility] [address] [place], [place], [place]   [facility] [address]  [place], [place]   [facility] - Intensive Outpatient [address], [place], [place]   [facility] [place].,  [place], [place] [phone] or [phone]   [facility] Services (RTS) [address], [place], [place] Accepts Medicaid  [facility] [place] [address]  [place] [phone] Substance Abuse/Addiction Treatment   [facility] Organization         Address  Phone  Notes  [facility]  [phone]   Domenic Schwab, PhD [address] Arlis Porta [employer], [place]   [phone] or [phone]   [place], [place] [phone]   [facility] [address] [place], [place] [phone] Insurance/Medicaid/sponsorship through [facility] and Families [address], [address] [place]                                    [place], [place] [phone] [place] [address], [place] [phone]    Dr. Adele Schilder  [phone]   [facility] Dept. 1) [address], [place] 2) [place] 3)  [address], [place] [phone]  [phone]   [place] [phone] or [phone] (After Hours)       Hypertension As your heart beats, it forces blood through your arteries. This force is your blood pressure. If the pressure is too high, it is called hypertension (HTN) or high blood pressure. HTN is dangerous because you may have it and not know it. High blood pressure may mean that your heart has to work harder to pump blood. Your arteries may be narrow or stiff. The extra work puts you at risk for heart disease, stroke, and other problems.  Blood pressure consists of two numbers, a higher number over a lower, 110/72, for example. It is stated as "110 over 72." The ideal is below 120 for the top number (systolic) and under 80 for the bottom (diastolic). Write down your blood pressure today. You should pay close attention to your blood pressure if you have certain conditions such as:  Heart failure.  Prior heart attack.  Diabetes  Chronic kidney disease.  Prior stroke.  Multiple risk factors for heart disease. To see if you have HTN, your blood pressure should be measured while you are seated with your arm held at the level of the heart. It should be measured at least twice. A one-time elevated blood pressure reading (especially in the Emergency Department) does not mean that you need treatment. There may be conditions in which the blood pressure is different between your right and  left arms. It is important to see your caregiver soon for a recheck. Most people have essential hypertension which means that there is not a specific cause. This type of high blood pressure may be lowered by changing  lifestyle factors such as:  Stress.  Smoking.  Lack of exercise.  Excessive weight.  Drug/tobacco/alcohol use.  Eating less salt. Most people do not have symptoms from high blood pressure until it has caused damage to the body. Effective treatment can often prevent, delay or reduce that damage. TREATMENT  When a cause has been identified, treatment for high blood pressure is directed at the cause. There are a large number of medications to treat HTN. These fall into several categories, and your caregiver will help you select the medicines that are best for you. Medications may have side effects. You should review side effects with your caregiver. If your blood pressure stays high after you have made lifestyle changes or started on medicines,   Your medication(s) may need to be changed.  Other problems may need to be addressed.  Be certain you understand your prescriptions, and know how and when to take your medicine.  Be sure to follow up with your caregiver within the time frame advised (usually within two weeks) to have your blood pressure rechecked and to review your medications.  If you are taking more than one medicine to lower your blood pressure, make sure you know how and at what times they should be taken. Taking two medicines at the same time can result in blood pressure that is too low. SEEK IMMEDIATE MEDICAL CARE IF:  You develop a severe headache, blurred or changing vision, or confusion.  You have unusual weakness or numbness, or a faint feeling.  You have severe chest or abdominal pain, vomiting, or breathing problems. MAKE SURE YOU:   Understand these instructions.  Will watch your condition.  Will get help right away if you are not doing well or get worse. Document Released: 08/17/2005 Document Revised: 11/09/2011 Document Reviewed: 04/06/2008 [facility] Patient Information 2014 [place].

## 2013-11-16 NOTE — Progress Notes (Signed)
Pt expressed concern over paying for medications once discharged.  Order placed for CM and CM notified.  Will continue to monitor.

## 2013-11-16 NOTE — Telephone Encounter (Signed)
  INTERNAL MEDICINE RESIDENCY PROGRAM After-Hours Telephone Call    Reason for call:   I placed an outgoing call to [name] at 1130 pm. Patient was discharged earlier today after the evaluation and management of newly discovered HTN and AKI/CKD.  Her presenting BP were 170-180's/110's.   On 11/15/13, she received metoprolol 10 mg IV x 3 doses and Hydralazine 5 mg IV x 1 dose.   On 11/16/13 ( today), she received metoprolol 10 mg IV x 1, Norvasc 5 mg po x 1 and HCTZ 25 mg po x 1 before discharge and her discharge BP ranged at 140-150's/80-90's.  She is discharged on Norvasc 5 mg po daily and HCTZ 12.5 mg po daily.    She reports elevated resting BP of 168/107-116 and mild headache this evening. Her headache was resolved after two Tylenol. She repeated her BP as we were speaking on the phone, and it was 152/110 and HR was 90's.    Assessment/ Plan:   Uncontrolled hypertension      I instructed patient to take additional Norvasc 5 mg pox 1 dose tonight. And recheck  Her BP in am. She can call the clinic for an OV appt if she has persist HTN> 150/110. I will send the clinic a note.  As always, pt is advised that if symptoms worsen or new symptoms arise, they should go to an urgent care facility or to to ER for further evaluation.    Charlann Lange, MD   11/16/2013, 11:44 PM

## 2013-11-17 ENCOUNTER — Ambulatory Visit: Payer: Self-pay | Attending: Internal Medicine

## 2013-11-17 ENCOUNTER — Telehealth: Payer: Self-pay | Admitting: Emergency Medicine

## 2013-11-17 MED ORDER — CLONIDINE HCL 0.1 MG PO TABS: 0.1000 mg | ORAL_TABLET | Freq: Three times a day (TID) | ORAL | Status: DC

## 2013-11-17 MED ORDER — AMLODIPINE BESYLATE 10 MG PO TABS: 10.0000 mg | ORAL_TABLET | Freq: Every day | ORAL | Status: DC

## 2013-11-17 NOTE — Telephone Encounter (Signed)
Pt called in regards to elevated pt. Pt has not established care here. New pt appt 11/22/13 Pt states she is taking Amlodipine 5mg /HCTZ 12.5 daily with elevated kidney functions Pt took bp today 158/101. Scheduled pt nurse visit @ 12pm

## 2013-11-17 NOTE — Patient Instructions (Signed)
Pt instructed to stop taking HCTZ 25 mg and increase Norvasc to 10 mg daily and return in 1 week for CMP repeat New medication Clonidine 0.1 mg TID added and sent to pharmacy

## 2013-11-17 NOTE — Progress Notes (Unsigned)
Subjective:     Patient ID: Rebecca Marquez, female   DOB: 25-Dec-1960, 53 y.o.   MRN: [id number]  HPI   Review of Systems     Objective:   Physical Exam     Assessment:     ***    Plan:     ***     Pt comes in today,not established as of yet. Pt has appt scheduled 11/22/12 HFU- newly diagnosed HTN. Taking prescribed Norvasc and HCTZ 25 mg daily Pt c/o elevated bp at home 158/101 with headache and nausea BP- 170/112 69

## 2013-11-17 NOTE — Discharge Summary (Signed)
INTERNAL MEDICINE ATTENDING DISCHARGE COSIGN   I evaluated the patient on the day of discharge and discussed the discharge plan with my resident team. I agree with the discharge documentation and disposition.   Madilyn Fireman 11/17/2013, 1:58 PM

## 2013-11-22 ENCOUNTER — Inpatient Hospital Stay: Payer: Self-pay

## 2013-11-24 ENCOUNTER — Other Ambulatory Visit: Payer: Self-pay

## 2013-12-11 ENCOUNTER — Encounter: Payer: Self-pay | Admitting: Internal Medicine

## 2013-12-11 ENCOUNTER — Ambulatory Visit: Payer: Self-pay | Attending: Internal Medicine | Admitting: Internal Medicine

## 2013-12-11 VITALS — BP 94/64 | HR 74 | Temp 98.6°F | Resp 18 | Ht 63.0 in | Wt 127.0 lb

## 2013-12-11 DIAGNOSIS — I10 Essential (primary) hypertension: Secondary | ICD-10-CM | POA: Insufficient documentation

## 2013-12-11 NOTE — Progress Notes (Signed)
Pt is here to f/o rash that is getting worse. Pt was prescribed Nizoral top cream but states its not working Itchiness all over BP 94/64 74 Pt Amlodipine was increased to 10 mg for HTN Denies dizziness or weakness

## 2013-12-11 NOTE — Progress Notes (Signed)
Patient ID: Rebecca Marquez, female   DOB: [birth year], 53 y.o.   MRN: [id number]  CC: new pt  HPI: 53 year old female with past medical history of hypertension and dyslipidemia who presented to clinic for followup. Patient has no current complaints of chest pain or abdominal pain. She also has history of CKD but reports today that she wasn't aware of her kidney dysfunction.  No Known Allergies Past Medical History  Diagnosis Date  . Hypertension   . Seasonal allergies    Current Outpatient Prescriptions on File Prior to Visit  Medication Sig Dispense Refill  . amLODipine (NORVASC) 10 MG tablet Take 1 tablet (10 mg total) by mouth daily.  90 tablet  3  . atorvastatin (LIPITOR) 40 MG tablet Take 1 tablet (40 mg total) by mouth daily at 6 PM.  30 tablet  0  . cloNIDine (CATAPRES) 0.1 MG tablet Take 1 tablet (0.1 mg total) by mouth 3 (three) times daily.  90 tablet  3  . ketoconazole (NIZORAL) 2 % cream Apply topically daily.  15 g  0   No current facility-administered medications on file prior to visit.   Family history significant for htn in mother.  History   Social History  . Marital Status: Legally Separated    Spouse Name: N/A    Number of Children: N/A  . Years of Education: N/A   Occupational History  . Not on file.   Social History Main Topics  . Smoking status: Former Smoker -- 0.50 packs/day for 20 years    Types: Cigarettes    Quit date: 08/31/2001  . Smokeless tobacco: Never Used  . Alcohol Use: No  . Drug Use: Yes    Special: Marijuana  . Sexual Activity: Not on file   Other Topics Concern  . Not on file   Social History Narrative  . No narrative on file    Review of Systems  Constitutional: Negative for fever, chills, diaphoresis, activity change, appetite change and fatigue.  HENT: Negative for ear pain, nosebleeds, congestion, facial swelling, rhinorrhea, neck pain, neck stiffness and ear discharge.   Eyes: Negative for pain, discharge, redness, itching  and visual disturbance.  Respiratory: Negative for cough, choking, chest tightness, shortness of breath, wheezing and stridor.   Cardiovascular: Negative for chest pain, palpitations and leg swelling.  Gastrointestinal: Negative for abdominal distention.  Genitourinary: Negative for dysuria, urgency, frequency, hematuria, flank pain, decreased urine volume, difficulty urinating and dyspareunia.  Musculoskeletal: Negative for back pain, joint swelling, arthralgias and gait problem.  Neurological: Negative for dizziness, tremors, seizures, syncope, facial asymmetry, speech difficulty, weakness, light-headedness, numbness and headaches.  Hematological: Negative for adenopathy. Does not bruise/bleed easily.  Psychiatric/Behavioral: Negative for hallucinations, behavioral problems, confusion, dysphoric mood, decreased concentration and agitation.    Objective:   Filed Vitals:   12/11/13 1214  BP: 94/64  Pulse: 74  Temp: 98.6 F (37 C)  Resp: 18    Physical Exam  Constitutional: Appears well-developed and well-nourished. No distress.  HENT: Normocephalic. External right and left ear normal. Oropharynx is clear and moist.  Eyes: Conjunctivae and EOM are normal. PERRLA, no scleral icterus.  Neck: Normal ROM. Neck supple. No JVD. No tracheal deviation. No thyromegaly.  CVS: RRR, S1/S2 +, no murmurs, no gallops, no carotid bruit.  Pulmonary: Effort and breath sounds normal, no stridor, rhonchi, wheezes, rales.  Abdominal: Soft. BS +,  no distension, tenderness, rebound or guarding.  Musculoskeletal: Normal range of motion. No edema and no tenderness.  Lymphadenopathy: No  lymphadenopathy noted, cervical, inguinal. Neuro: Alert. Normal reflexes, muscle tone coordination. No cranial nerve deficit. Skin: Skin is warm and dry. No rash noted. Not diaphoretic. No erythema. No pallor.  Psychiatric: Normal mood and affect. Behavior, judgment, thought content normal.   Lab Results  Component Value  Date   WBC 4.3 11/15/2013   HGB 12.4 11/15/2013   HCT 35.0* 11/15/2013   MCV 90.9 11/15/2013   PLT 227 11/15/2013   Lab Results  Component Value Date   CREATININE 2.24* 11/16/2013   BUN 26* 11/16/2013   NA 140 11/16/2013   K 4.4 11/16/2013   CL 107 11/16/2013   CO2 20 11/16/2013    Lab Results  Component Value Date   HGBA1C 5.7* 11/16/2013   Lipid Panel     Component Value Date/Time   CHOL 188 11/16/2013 0408   TRIG 77 11/16/2013 0408   HDL 50 11/16/2013 0408   CHOLHDL 3.8 11/16/2013 0408   VLDL 15 11/16/2013 0408   LDLCALC 123* 11/16/2013 0408       Assessment and plan:   Patient Active Problem List   Diagnosis Date Noted  . HTN (hypertension) 12/11/2013    Priority: High - BP on soft side so we recommended to decrease norvasc to 5 mg daily and to continue clonidine  . HLD (hyperlipidemia) 11/16/2013    Priority: Medium - continue statin therapy  . Chronic kidney disease 11/16/2013    Priority: Medium - recheck BMP in 2 weeks - referral to nephrology provided

## 2013-12-11 NOTE — Patient Instructions (Signed)
Chronic Kidney Disease Chronic kidney disease occurs when the kidneys are damaged over a long period. The kidneys are two organs that lie on either side of the spine between the middle of the back and the front of the abdomen. The kidneys:   Remove wastes and extra water from the blood.   Produce important hormones. These help keep bones strong, regulate blood pressure, and help create red blood cells.   Balance the fluids and chemicals in the blood and tissues. A small amount of kidney damage may not cause problems, but a large amount of damage may make it difficult or impossible for the kidneys to work the way they should. If steps are not taken to slow down the kidney damage or stop it from getting worse, the kidneys may stop working permanently. Most of the time, chronic kidney disease does not go away. However, it can often be controlled, and those with the disease can usually live normal lives. CAUSES  The most common causes of chronic kidney disease are diabetes and high blood pressure (hypertension). Chronic kidney disease may also be caused by:   Diseases that cause kidneys' filters to become inflamed.   Diseases that affect the immune system.   Genetic diseases.   Medicines that damage the kidneys, such as anti-inflammatory medicines.  Poisoning or exposure to toxic substances.   A reoccurring kidney or urinary infection.   A problem with urine flow. This may be caused by:   Cancer.   Kidney stones.   An enlarged prostate in males. SYMPTOMS  Because the kidney damage in chronic kidney disease occurs slowly, symptoms develop slowly and may not be obvious until the kidney damage becomes severe. A person may have a kidney disease for years without showing any symptoms. Symptoms can include:   Swelling (edema) of the legs, ankles, or feet.   Tiredness (lethargy).   Nausea or vomiting.   Confusion.   Problems with urination, such as:   Decreased urine  production.   Frequent urination, especially at night.   Frequent accidents in children who are potty trained.   Muscle twitches and cramps.   Shortness of breath.  Weakness.   Persistent itchiness.   Loss of appetite.  Metallic taste in the mouth.  Trouble sleeping.  Slowed development in children.  Short stature in children. DIAGNOSIS  Chronic kidney disease may be detected and diagnosed by tests, including blood, urine, imaging, or kidney biopsy tests.  TREATMENT  Most chronic kidney diseases cannot be cured. Treatment usually involves relieving symptoms and preventing or slowing the progression of the disease. Treatment may include:   A special diet. You may need to avoid alcohol and foods thatare salty and high in potassium.   Medicines. These may:   Lower blood pressure.   Relieve anemia.   Relieve swelling.   Protect the bones. HOME CARE INSTRUCTIONS   Follow your prescribed diet.   Only take over-the-counter or prescription medicines as directed by your caregiver.  Do not take any new medicines (prescription, over-the-counter, or nutritional supplements) unless approved by your caregiver. Many medicines can worsen your kidney damage or need to have the dose adjusted.   Quit smoking if you are a smoker. Talk to your caregiver about a smoking cessation program.   Keep all follow-up appointments as directed by your caregiver. SEEK IMMEDIATE MEDICAL CARE IF:  Your symptoms get worse or you develop new symptoms.   You develop symptoms of end-stage kidney disease. These include:   Headaches.  Abnormally dark or light skin.   Numbness in the hands or feet.   Easy bruising.   Frequent hiccups.   Menstruation stops.   You have a fever.   You have decreased urine production.   You havepain or bleeding when urinating. MAKE SURE YOU:  Understand these instructions.  Will watch your condition.  Will get help right  away if you are not doing well or get worse. FOR MORE INFORMATION  American Association of Kidney Patients: BombTimer.gl National Kidney Foundation: www.kidney.[place]: https://mathis.com/ [facility]: www.lifeoptions.org and www.kidneyschool.org Document Released: 05/26/2008 Document Revised: 08/03/2012 Document Reviewed: 04/15/2012 [facility] Patient Information 2014 [place], [place].

## 2013-12-25 ENCOUNTER — Other Ambulatory Visit: Payer: Self-pay

## 2014-01-07 ENCOUNTER — Other Ambulatory Visit: Payer: Self-pay | Admitting: Internal Medicine

## 2014-04-03 ENCOUNTER — Other Ambulatory Visit: Payer: Self-pay | Admitting: *Deleted

## 2014-04-03 DIAGNOSIS — I10 Essential (primary) hypertension: Secondary | ICD-10-CM

## 2014-04-03 MED ORDER — CLONIDINE HCL 0.1 MG PO TABS: 0.1000 mg | ORAL_TABLET | Freq: Three times a day (TID) | ORAL | Status: DC

## 2014-04-04 ENCOUNTER — Other Ambulatory Visit: Payer: Self-pay

## 2014-04-04 ENCOUNTER — Emergency Department (HOSPITAL_COMMUNITY)
Admission: EM | Admit: 2014-04-04 | Discharge: 2014-04-05 | Disposition: A | Discharge status: Home / Self Care | Payer: BC Managed Care – PPO | Attending: Emergency Medicine | Admitting: Emergency Medicine

## 2014-04-04 ENCOUNTER — Encounter (HOSPITAL_COMMUNITY): Payer: Self-pay | Admitting: Emergency Medicine

## 2014-04-04 DIAGNOSIS — R9431 Abnormal electrocardiogram [ECG] [EKG]: Secondary | ICD-10-CM | POA: Insufficient documentation

## 2014-04-04 DIAGNOSIS — R011 Cardiac murmur, unspecified: Secondary | ICD-10-CM | POA: Insufficient documentation

## 2014-04-04 DIAGNOSIS — Z8709 Personal history of other diseases of the respiratory system: Secondary | ICD-10-CM | POA: Insufficient documentation

## 2014-04-04 DIAGNOSIS — Z7982 Long term (current) use of aspirin: Secondary | ICD-10-CM | POA: Insufficient documentation

## 2014-04-04 DIAGNOSIS — I10 Essential (primary) hypertension: Secondary | ICD-10-CM | POA: Insufficient documentation

## 2014-04-04 DIAGNOSIS — M79609 Pain in unspecified limb: Secondary | ICD-10-CM | POA: Insufficient documentation

## 2014-04-04 DIAGNOSIS — R112 Nausea with vomiting, unspecified: Secondary | ICD-10-CM | POA: Insufficient documentation

## 2014-04-04 DIAGNOSIS — M791 Myalgia, unspecified site: Secondary | ICD-10-CM

## 2014-04-04 DIAGNOSIS — R109 Unspecified abdominal pain: Secondary | ICD-10-CM

## 2014-04-04 DIAGNOSIS — Z79899 Other long term (current) drug therapy: Secondary | ICD-10-CM | POA: Insufficient documentation

## 2014-04-04 DIAGNOSIS — Z87891 Personal history of nicotine dependence: Secondary | ICD-10-CM | POA: Insufficient documentation

## 2014-04-04 LAB — COMPREHENSIVE METABOLIC PANEL
ALK PHOS: 122 U/L — AB (ref 39–117)
ALT: 20 U/L (ref 0–35)
AST: 29 U/L (ref 0–37)
Albumin: 4.3 g/dL (ref 3.5–5.2)
Anion gap: 19 — ABNORMAL HIGH (ref 5–15)
BUN: 37 mg/dL — ABNORMAL HIGH (ref 6–23)
CO2: 17 mEq/L — ABNORMAL LOW (ref 19–32)
Calcium: 9.5 mg/dL (ref 8.4–10.5)
Chloride: 108 mEq/L (ref 96–112)
Creatinine, Ser: 2.19 mg/dL — ABNORMAL HIGH (ref 0.50–1.10)
GFR calc Af Amer: 29 mL/min — ABNORMAL LOW (ref >90)
GFR calc non Af Amer: 25 mL/min — ABNORMAL LOW (ref >90)
GLUCOSE: 133 mg/dL — AB (ref 70–99)
POTASSIUM: 3.5 meq/L — AB (ref 3.7–5.3)
SODIUM: 144 meq/L (ref 137–147)
TOTAL PROTEIN: 8.6 g/dL — AB (ref 6.0–8.3)
Total Bilirubin: 0.3 mg/dL (ref 0.3–1.2)

## 2014-04-04 LAB — CBC WITH DIFFERENTIAL/PLATELET
Basophils Absolute: 0 10*3/uL (ref 0.0–0.1)
Basophils Relative: 0 % (ref 0–1)
EOS ABS: 0 10*3/uL (ref 0.0–0.7)
Eosinophils Relative: 0 % (ref 0–5)
HCT: 37.2 % (ref 36.0–46.0)
Hemoglobin: 13.4 g/dL (ref 12.0–15.0)
LYMPHS ABS: 0.7 10*3/uL (ref 0.7–4.0)
Lymphocytes Relative: 7 % — ABNORMAL LOW (ref 12–46)
MCH: 33 pg (ref 26.0–34.0)
MCHC: 36 g/dL (ref 30.0–36.0)
MCV: 91.6 fL (ref 78.0–100.0)
Monocytes Absolute: 0.4 10*3/uL (ref 0.1–1.0)
Monocytes Relative: 4 % (ref 3–12)
NEUTROS PCT: 89 % — AB (ref 43–77)
Neutro Abs: 8.9 10*3/uL — ABNORMAL HIGH (ref 1.7–7.7)
Platelets: 255 10*3/uL (ref 150–400)
RBC: 4.06 MIL/uL (ref 3.87–5.11)
RDW: 14.9 % (ref 11.5–15.5)
WBC: 10 10*3/uL (ref 4.0–10.5)

## 2014-04-04 LAB — MAGNESIUM: MAGNESIUM: 2 mg/dL (ref 1.5–2.5)

## 2014-04-04 LAB — I-STAT TROPONIN, ED: Troponin i, poc: 0.04 ng/mL (ref 0.00–0.08)

## 2014-04-04 LAB — LIPASE, BLOOD: Lipase: 45 U/L (ref 11–59)

## 2014-04-04 MED ORDER — LACTATED RINGERS IV BOLUS (SEPSIS)
1000.0000 mL | Freq: Once | INTRAVENOUS | Status: AC
  Administered 2014-04-04: 1000 mL via INTRAVENOUS

## 2014-04-04 MED ORDER — ONDANSETRON HCL 4 MG/2ML IJ SOLN
4.0000 mg | Freq: Once | INTRAMUSCULAR | Status: AC
  Administered 2014-04-04: 4 mg via INTRAVENOUS
  Filled 2014-04-04: qty 2

## 2014-04-04 MED ORDER — GI COCKTAIL ~~LOC~~
30.0000 mL | Freq: Once | ORAL | Status: AC
  Administered 2014-04-04: 30 mL via ORAL
  Filled 2014-04-04: qty 30

## 2014-04-04 MED ORDER — FENTANYL CITRATE 0.05 MG/ML IJ SOLN
50.0000 ug | Freq: Once | INTRAMUSCULAR | Status: AC
  Administered 2014-04-04: 50 ug via INTRAVENOUS
  Filled 2014-04-04: qty 2

## 2014-04-04 NOTE — ED Notes (Signed)
Witnessed pt sticking finger down throat. Informed pt to stop. No vomit seen.

## 2014-04-04 NOTE — ED Notes (Signed)
This RN went to check on patient for hourly rounding and witnessed patient sticking finger down throat as to self induce vomiting. Pt dry heaving when doing this.

## 2014-04-04 NOTE — ED Notes (Signed)
PER EMS: pt from home, pt reports around 2030 this evening pt began to feel nauseous and reports vomiting. Upon EMS arrival pt was dry heaving and thrashing about in the floor. EMS reports ST depression leads 2, 3 and AVF. Pt also reports abdominal pain and left calf cramping. A&OX4, moaning. BP-168/98, HR-104, CBG-132.

## 2014-04-04 NOTE — ED Notes (Signed)
Dr. Nanavati at bedside 

## 2014-04-05 ENCOUNTER — Emergency Department (HOSPITAL_COMMUNITY): Payer: BC Managed Care – PPO

## 2014-04-05 ENCOUNTER — Encounter (HOSPITAL_COMMUNITY): Payer: Self-pay

## 2014-04-05 LAB — TROPONIN I

## 2014-04-05 LAB — CK: CK TOTAL: 504 U/L — AB (ref 7–177)

## 2014-04-05 MED ORDER — PROMETHAZINE HCL 25 MG PO TABS: 25.0000 mg | ORAL_TABLET | Freq: Four times a day (QID) | ORAL | Status: DC | PRN

## 2014-04-05 MED ORDER — SODIUM CHLORIDE 0.9 % IV BOLUS (SEPSIS)
1000.0000 mL | Freq: Once | INTRAVENOUS | Status: AC
  Administered 2014-04-05: 1000 mL via INTRAVENOUS

## 2014-04-05 MED ORDER — PROMETHAZINE HCL 25 MG RE SUPP: 25.0000 mg | Freq: Four times a day (QID) | RECTAL | Status: DC | PRN

## 2014-04-05 MED ORDER — PROMETHAZINE HCL 25 MG/ML IJ SOLN
25.0000 mg | Freq: Once | INTRAMUSCULAR | Status: AC
  Administered 2014-04-05: 25 mg via INTRAVENOUS
  Filled 2014-04-05: qty 1

## 2014-04-05 MED ORDER — TRAMADOL HCL 50 MG PO TABS: 50.0000 mg | ORAL_TABLET | Freq: Four times a day (QID) | ORAL | Status: DC | PRN

## 2014-04-05 MED ORDER — IOHEXOL 300 MG/ML  SOLN
25.0000 mL | INTRAMUSCULAR | Status: AC
  Administered 2014-04-05: 25 mL via ORAL

## 2014-04-05 NOTE — ED Notes (Signed)
Pt still gagging and sticking her finger down her throat. Pt instructed to stop sticking her finger down her throat.

## 2014-04-05 NOTE — ED Provider Notes (Signed)
CSN: [id number]     Arrival date & time 04/04/14  2134 History   First MD Initiated Contact with Patient 04/04/14 2152     Chief Complaint  Patient presents with  . Nausea  . Emesis  . Abnormal ECG     (Consider location/radiation/quality/duration/timing/severity/associated sxs/prior Treatment) HPI Comments: PT with hx of HTN comes in with cc of muscle cramps and nausea, emesis. Pt reports multiple episodes of emesis, with food material in it, starting this evening. With this, patient has diffuse abd pain, and bilateral leg cramping. All the sx started this evening.  EMS recorded a 12 lead, and she has inferior ST depression. Denies chest pain, dib, diaphoresis, CAD hx, substance abuse.  Patient is a 53 y.o. female presenting with vomiting. The history is provided by the patient.  Emesis Associated symptoms: abdominal pain and myalgias   Associated symptoms: no diarrhea     Past Medical History  Diagnosis Date  . Hypertension   . Seasonal allergies    Past Surgical History  Procedure Laterality Date  . Tubal ligation    . Tubal ligation     No family history on file. History  Substance Use Topics  . Smoking status: Former Smoker -- 0.50 packs/day for 20 years    Types: Cigarettes    Quit date: 08/31/2001  . Smokeless tobacco: Never Used  . Alcohol Use: No   OB History   Grav Para Term Preterm Abortions TAB SAB Ect Mult Living                 Review of Systems  Constitutional: Negative for activity change.  HENT: Negative for facial swelling.   Respiratory: Negative for cough, shortness of breath and wheezing.   Cardiovascular: Negative for chest pain.  Gastrointestinal: Positive for nausea, vomiting and abdominal pain. Negative for diarrhea, constipation, blood in stool and abdominal distention.  Genitourinary: Negative for hematuria and difficulty urinating.  Musculoskeletal: Positive for myalgias. Negative for neck pain.  Skin: Negative for color change.   Neurological: Negative for speech difficulty.  Hematological: Does not bruise/bleed easily.  Psychiatric/Behavioral: Negative for confusion.      Allergies  Review of patient's allergies indicates no known allergies.  Home Medications   Prior to Admission medications   Medication Sig Start Date End Date Taking? Authorizing Provider  acetaminophen (TYLENOL) 500 MG tablet Take 1,000 mg by mouth every 6 (six) hours as needed for moderate pain.   Yes Historical Provider, MD  amLODipine (NORVASC) 10 MG tablet Take 1 tablet (10 mg total) by mouth daily. 11/17/13  Yes Reyne Dumas, MD  aspirin 325 MG tablet Take 650 mg by mouth every 6 (six) hours as needed.    Yes Historical Provider, MD   BP 164/90  Pulse 94  Resp 16  SpO2 100% Physical Exam  Nursing note and vitals reviewed. Constitutional: She is oriented to person, place, and time. She appears well-developed and well-nourished.  HENT:  Head: Normocephalic and atraumatic.  Eyes: EOM are normal. Pupils are equal, round, and reactive to light.  Neck: Neck supple.  Cardiovascular: Normal rate and regular rhythm.   Murmur heard. Pulmonary/Chest: Effort normal. No respiratory distress.  Abdominal: Soft. She exhibits no distension. There is tenderness. There is no rebound and no guarding.  Musculoskeletal: She exhibits tenderness. She exhibits no edema.  Neurological: She is alert and oriented to person, place, and time.  Skin: Skin is warm and dry.    ED Course  Procedures (including critical care time)  Labs Review Labs Reviewed  CBC WITH DIFFERENTIAL - Abnormal; Notable for the following:    Neutrophils Relative % 89 (*)    Neutro Abs 8.9 (*)    Lymphocytes Relative 7 (*)    All other components within normal limits  COMPREHENSIVE METABOLIC PANEL - Abnormal; Notable for the following:    Potassium 3.5 (*)    CO2 17 (*)    Glucose, Bld 133 (*)    BUN 37 (*)    Creatinine, Ser 2.19 (*)    Total Protein 8.6 (*)     Alkaline Phosphatase 122 (*)    GFR calc non Af Amer 25 (*)    GFR calc Af Amer 29 (*)    Anion gap 19 (*)    All other components within normal limits  LIPASE, BLOOD  MAGNESIUM  I-STAT TROPOININ, ED    Imaging Review No results found.   EKG Interpretation   Date/Time:  Wednesday April 04 2014 21:43:00 EDT Ventricular Rate:  81 PR Interval:  147 QRS Duration: 91 QT Interval:  423 QTC Calculation: 491 R Axis:   86 Text Interpretation:  Sinus rhythm Consider right atrial enlargement Left  ventricular hypertrophy Repol abnrm suggests ischemia, inferior leads  Confirmed by Kathrynn Humble, MD, Thelma Comp [phone]) on 04/04/2014 11:21:50 PM    ST depression inferior leads  MDM   Final diagnoses:  None   Pt comes in with cc of abd pain, nausea, emesis, cramping pain. EKG shows ST depression. She has hx of TL, no CAD hx and no chest pain, dib or hard CAD risk factors.  Pt has no abd tenderness initially. She has no GU pathology hx, and when she had abd pain - it was mild, unlike the cramping pain - which was severe in her bilateral calfs. ? elyte abn causing cramping. CK will be ordered.  Pt has St depression - will get trops x 2. She has no chest pain, and her CAD risk factors are HTN only.  12:50 AM Pt reassessed. Calf pain went away completely with fentanyl, but has now returned. Labs are reassuring. Abd pain present now, and exam shows tenderness diffusely. CT ordered - oral contrast only.  Will have Dr. Sabra Heck f/u on the results that are pending. Anticipate d.c.     Varney Biles, MD 04/05/14 [phone]

## 2014-04-05 NOTE — Discharge Instructions (Signed)
We saw you in the ER for the cramping, abdominal pain All the results in the ER are normal, labs and imaging. We are not sure what is causing your symptoms. The workup in the ER is not complete, and is limited to screening for life threatening and emergent conditions only, so please see a primary care doctor for further evaluation. SEE THE CARDIOLOGIST, TO MAKE SURE YOUR HEART EKG IS STAYING STABLE.   Abdominal Pain, Women Abdominal (stomach, pelvic, or belly) pain can be caused by many things. It is important to tell your doctor:  The location of the pain.  Does it come and go or is it present all the time?  Are there things that start the pain (eating certain foods, exercise)?  Are there other symptoms associated with the pain (fever, nausea, vomiting, diarrhea)? All of this is helpful to know when trying to find the cause of the pain. CAUSES   Stomach: virus or bacteria infection, or ulcer.  Intestine: appendicitis (inflamed appendix), regional ileitis (Crohn's disease), ulcerative colitis (inflamed colon), irritable bowel syndrome, diverticulitis (inflamed diverticulum of the colon), or cancer of the stomach or intestine.  Gallbladder disease or stones in the gallbladder.  Kidney disease, kidney stones, or infection.  Pancreas infection or cancer.  Fibromyalgia (pain disorder).  Diseases of the female organs:  Uterus: fibroid (non-cancerous) tumors or infection.  Fallopian tubes: infection or tubal pregnancy.  Ovary: cysts or tumors.  Pelvic adhesions (scar tissue).  Endometriosis (uterus lining tissue growing in the pelvis and on the pelvic organs).  Pelvic congestion syndrome (female organs filling up with blood just before the menstrual period).  Pain with the menstrual period.  Pain with ovulation (producing an egg).  Pain with an IUD (intrauterine device, birth control) in the uterus.  Cancer of the female organs.  Functional pain (pain not caused by a  disease, may improve without treatment).  Psychological pain.  Depression. DIAGNOSIS  Your doctor will decide the seriousness of your pain by doing an examination.  Blood tests.  X-rays.  Ultrasound.  CT scan (computed tomography, special type of X-ray).  MRI (magnetic resonance imaging).  Cultures, for infection.  Barium enema (dye inserted in the large intestine, to better view it with X-rays).  Colonoscopy (looking in intestine with a lighted tube).  Laparoscopy (minor surgery, looking in abdomen with a lighted tube).  Major abdominal exploratory surgery (looking in abdomen with a large incision). TREATMENT  The treatment will depend on the cause of the pain.   Many cases can be observed and treated at home.  Over-the-counter medicines recommended by your caregiver.  Prescription medicine.  Antibiotics, for infection.  Birth control pills, for painful periods or for ovulation pain.  Hormone treatment, for endometriosis.  Nerve blocking injections.  Physical therapy.  Antidepressants.  Counseling with a psychologist or psychiatrist.  Minor or major surgery. HOME CARE INSTRUCTIONS   Do not take laxatives, unless directed by your caregiver.  Take over-the-counter pain medicine only if ordered by your caregiver. Do not take aspirin because it can cause an upset stomach or bleeding.  Try a clear liquid diet (broth or water) as ordered by your caregiver. Slowly move to a bland diet, as tolerated, if the pain is related to the stomach or intestine.  Have a thermometer and take your temperature several times a day, and record it.  Bed rest and sleep, if it helps the pain.  Avoid sexual intercourse, if it causes pain.  Avoid stressful situations.  Keep your  follow-up appointments and tests, as your caregiver orders.  If the pain does not go away with medicine or surgery, you may try:  Acupuncture.  Relaxation exercises (yoga, meditation).  Group  therapy.  Counseling. SEEK MEDICAL CARE IF:   You notice certain foods cause stomach pain.  Your home care treatment is not helping your pain.  You need stronger pain medicine.  You want your IUD removed.  You feel faint or lightheaded.  You develop nausea and vomiting.  You develop a rash.  You are having side effects or an allergy to your medicine. SEEK IMMEDIATE MEDICAL CARE IF:   Your pain does not go away or gets worse.  You have a fever.  Your pain is felt only in portions of the abdomen. The right side could possibly be appendicitis. The left lower portion of the abdomen could be colitis or diverticulitis.  You are passing blood in your stools (bright red or black tarry stools, with or without vomiting).  You have blood in your urine.  You develop chills, with or without a fever.  You pass out. MAKE SURE YOU:   Understand these instructions.  Will watch your condition.  Will get help right away if you are not doing well or get worse. Document Released: 06/14/2007 Document Revised: 01/01/2014 Document Reviewed: 07/04/2009 [facility] Patient Information 2015 [place], [place]. This information is not intended to replace advice given to you by your health care provider. Make sure you discuss any questions you have with your health care provider.  Muscle Pain Muscle pain (myalgia) may be caused by many things, including:  Overuse or muscle strain, especially if you are not in shape. This is the most common cause of muscle pain.  Injury.  Bruises.  Viruses, such as the flu.  Infectious diseases.  Fibromyalgia, which is a chronic condition that causes muscle tenderness, fatigue, and headache.  Autoimmune diseases, including lupus.  Certain drugs, including ACE inhibitors and statins. Muscle pain may be mild or severe. In most cases, the pain lasts only a short time and goes away without treatment. To diagnose the cause of your muscle pain, your health care  provider will take your medical history. This means he or she will ask you when your muscle pain began and what has been happening. If you have not had muscle pain for very long, your health care provider may want to wait before doing much testing. If your muscle pain has lasted a long time, your health care provider may want to run tests right away. If your health care provider thinks your muscle pain may be caused by illness, you may need to have additional tests to rule out certain conditions.  Treatment for muscle pain depends on the cause. Home care is often enough to relieve muscle pain. Your health care provider may also prescribe anti-inflammatory medicine. HOME CARE INSTRUCTIONS Watch your condition for any changes. The following actions may help to lessen any discomfort you are feeling:  Only take over-the-counter or prescription medicines as directed by your health care provider.  Apply ice to the sore muscle:  Put ice in a plastic bag.  Place a towel between your skin and the bag.  Leave the ice on for 15-20 minutes, 3-4 times a day.  You may alternate applying hot and cold packs to the muscle as directed by your health care provider.  If overuse is causing your muscle pain, slow down your activities until the pain goes away.  Remember that it is  normal to feel some muscle pain after starting a workout program. Muscles that have not been used often will be sore at first.  Do regular, gentle exercises if you are not usually active.  Warm up before exercising to lower your risk of muscle pain.  Do not continue working out if the pain is very bad. Bad pain could mean you have injured a muscle. SEEK MEDICAL CARE IF:  Your muscle pain gets worse, and medicines do not help.  You have muscle pain that lasts longer than 3 days.  You have a rash or fever along with muscle pain.  You have muscle pain after a tick bite.  You have muscle pain while working out, even though you are  in good physical condition.  You have redness, soreness, or swelling along with muscle pain.  You have muscle pain after starting a new medicine or changing the dose of a medicine. SEEK IMMEDIATE MEDICAL CARE IF:  You have trouble breathing.  You have trouble swallowing.  You have muscle pain along with a stiff neck, fever, and vomiting.  You have severe muscle weakness or cannot move part of your body. MAKE SURE YOU:   Understand these instructions.  Will watch your condition.  Will get help right away if you are not doing well or get worse. Document Released: 07/09/2006 Document Revised: 08/22/2013 Document Reviewed: 06/13/2013 [facility] Patient Information 2015 [place], [place]. This information is not intended to replace advice given to you by your health care provider. Make sure you discuss any questions you have with your health care provider.

## 2014-06-15 ENCOUNTER — Other Ambulatory Visit: Payer: Self-pay

## 2014-07-28 ENCOUNTER — Other Ambulatory Visit: Payer: Self-pay | Admitting: Internal Medicine

## 2014-07-31 ENCOUNTER — Other Ambulatory Visit: Payer: Self-pay | Admitting: *Deleted

## 2014-07-31 MED ORDER — AMLODIPINE BESYLATE 10 MG PO TABS: 10.0000 mg | ORAL_TABLET | Freq: Every day | ORAL | Status: DC

## 2014-07-31 NOTE — Progress Notes (Signed)
Pt called wanting refills for clonodine and amlodipine. I was unable to prescribe the clonidine pt states that the clonidine was the medication that actually worked. That medication was D/C. So I prescribed Norvasc 10 mg QD for only 30 days. I told her that she had to get on the schedule so the Dr. Could address this rx issue.

## 2014-08-03 ENCOUNTER — Ambulatory Visit: Payer: BC Managed Care – PPO | Attending: Family Medicine | Admitting: Family Medicine

## 2014-08-03 ENCOUNTER — Encounter: Payer: Self-pay | Admitting: Family Medicine

## 2014-08-03 VITALS — BP 145/101 | HR 106 | Temp 98.5°F | Resp 18 | Wt 126.0 lb

## 2014-08-03 DIAGNOSIS — N189 Chronic kidney disease, unspecified: Secondary | ICD-10-CM | POA: Insufficient documentation

## 2014-08-03 DIAGNOSIS — N183 Chronic kidney disease, stage 3 unspecified: Secondary | ICD-10-CM

## 2014-08-03 DIAGNOSIS — Z87891 Personal history of nicotine dependence: Secondary | ICD-10-CM | POA: Insufficient documentation

## 2014-08-03 DIAGNOSIS — I150 Renovascular hypertension: Secondary | ICD-10-CM

## 2014-08-03 DIAGNOSIS — I10 Essential (primary) hypertension: Secondary | ICD-10-CM | POA: Insufficient documentation

## 2014-08-03 MED ORDER — CLONIDINE HCL 0.1 MG PO TABS: 0.1000 mg | ORAL_TABLET | Freq: Three times a day (TID) | ORAL | Status: DC

## 2014-08-03 MED ORDER — CLONIDINE HCL 0.1 MG PO TABS
0.1000 mg | ORAL_TABLET | Freq: Once | ORAL | Status: AC
  Administered 2014-08-03: 0.1 mg via ORAL

## 2014-08-03 NOTE — Patient Instructions (Signed)
Rebecca Marquez,  Thank you for coming in today. It was a pleasure meeting you. I look forward to being your primary doctor.   1. HTN: Restart clonidine 0.1 mg three times daily.  Continue amlodipine 10 for now with pan to decrease to 5 mg soon.  Close f/u: In 4-7 days with RN If RN not available with me   Dr. Adrian Blackwater

## 2014-08-03 NOTE — Assessment & Plan Note (Signed)
A: CKD P: BMP to check renal function Treat  BP to goal < 140/90 as close to 120/80 as possible

## 2014-08-03 NOTE — Progress Notes (Signed)
   Subjective:    Patient ID: Rebecca Marquez, female    DOB: August 07, 1961, 53 y.o.   MRN: [id number] CC: HTN f/u  HPI 53 yo F:  1. CHRONIC HYPERTENSION  Disease Monitoring  Blood pressure range: 155/110  Chest pain: no   Dyspnea: no   Claudication: no   Medication compliance: yes, partially. Taking amlodipine 10 mg daily. Ran out of clonidine 0.1 mg TID.  3 days ago.  Medication Side Effects  Lightheadedness: yes   Urinary frequency: no   Edema: no   Preventitive Healthcare:  Exercise: yes   Diet Pattern: regular   Salt Restriction: yes  Soc Hx: former smoker, quit in 2003 Review of Systems As per HPI     Objective:   Physical Exam BP 145/101 mmHg  Pulse 106  Temp(Src) 98.5 F (36.9 C) (Oral)  Resp 18  Wt 126 lb (57.153 kg)  SpO2 99%  BP Readings from Last 3 Encounters:  08/03/14 145/101  04/05/14 155/94  12/11/13 94/64  General appearance: alert, cooperative and no distress Lungs: clear to auscultation bilaterally Heart: regular rate and rhythm, S1, S2 normal, no murmur, click, rub or gallop Extremities: extremities normal, atraumatic, no cyanosis or edema     Assessment & Plan:

## 2014-08-03 NOTE — Progress Notes (Signed)
F/U HTN  Medicine refill

## 2014-08-03 NOTE — Assessment & Plan Note (Signed)
A: HTN not at goal Meds: compliant with norvasc, out of clonidine  P:  Discussed options, patient prefers to restart clonidine. Clonidine 0.1 mg x one in office  0.1 mg TID prescribed.  Continue norvasc 10 with plan to decrease dose to 5 if BP low Close f/u in 1 week with RN preferably with me if RN not available.

## 2014-08-04 LAB — BASIC METABOLIC PANEL
BUN: 29 mg/dL — ABNORMAL HIGH (ref 6–23)
CALCIUM: 9.6 mg/dL (ref 8.4–10.5)
CO2: 26 mEq/L (ref 19–32)
Chloride: 102 mEq/L (ref 96–112)
Creat: 1.92 mg/dL — ABNORMAL HIGH (ref 0.50–1.10)
GLUCOSE: 112 mg/dL — AB (ref 70–99)
POTASSIUM: 4.8 meq/L (ref 3.5–5.3)
SODIUM: 140 meq/L (ref 135–145)

## 2014-08-10 ENCOUNTER — Ambulatory Visit: Payer: BC Managed Care – PPO | Attending: Family Medicine

## 2014-08-10 NOTE — Progress Notes (Unsigned)
Patient ID: Rebecca Marquez, female   DOB: 27-Nov-1960, 53 y.o.   MRN: [id number] Pt comes in today for blood pressure recheck after medication added Norvasc 10 mg tab daily 12/15 OV. Pt is compliant with taking Clonidine 0.1 mg tab as well. Denies dizziness,headache or nausea with new medication Pt states last home BP checked 3 days ago 110/86 86 BP- 108/69 65 Pt counseled if she experiences dizziness or feeling faint to call clinic and take 1/2 tablet Norvasc Pt verbalized understanding Reviewed with Dr. Adrian Blackwater Pt to return with next scheduled office visit

## 2014-08-10 NOTE — Patient Instructions (Signed)
Hypertension Hypertension, commonly called high blood pressure, is when the force of blood pumping through your arteries is too strong. Your arteries are the blood vessels that carry blood from your heart throughout your body. A blood pressure reading consists of a higher number over a lower number, such as 110/72. The higher number (systolic) is the pressure inside your arteries when your heart pumps. The lower number (diastolic) is the pressure inside your arteries when your heart relaxes. Ideally you want your blood pressure below 120/80. Hypertension forces your heart to work harder to pump blood. Your arteries may become narrow or stiff. Having hypertension puts you at risk for heart disease, stroke, and other problems.  RISK FACTORS Some risk factors for high blood pressure are controllable. Others are not.  Risk factors you cannot control include:   Race. You may be at higher risk if you are African American.  Age. Risk increases with age.  Gender. Men are at higher risk than women before age 45 years. After age 65, women are at higher risk than men. Risk factors you can control include:  Not getting enough exercise or physical activity.  Being overweight.  Getting too much fat, sugar, calories, or salt in your diet.  Drinking too much alcohol. SIGNS AND SYMPTOMS Hypertension does not usually cause signs or symptoms. Extremely high blood pressure (hypertensive crisis) may cause headache, anxiety, shortness of breath, and nosebleed. DIAGNOSIS  To check if you have hypertension, your health care provider will measure your blood pressure while you are seated, with your arm held at the level of your heart. It should be measured at least twice using the same arm. Certain conditions can cause a difference in blood pressure between your right and left arms. A blood pressure reading that is higher than normal on one occasion does not mean that you need treatment. If one blood pressure reading  is high, ask your health care provider about having it checked again. TREATMENT  Treating high blood pressure includes making lifestyle changes and possibly taking medicine. Living a healthy lifestyle can help lower high blood pressure. You may need to change some of your habits. Lifestyle changes may include:  Following the DASH diet. This diet is high in fruits, vegetables, and whole grains. It is low in salt, red meat, and added sugars.  Getting at least 2 hours of brisk physical activity every week.  Losing weight if necessary.  Not smoking.  Limiting alcoholic beverages.  Learning ways to reduce stress. If lifestyle changes are not enough to get your blood pressure under control, your health care provider may prescribe medicine. You may need to take more than one. Work closely with your health care provider to understand the risks and benefits. HOME CARE INSTRUCTIONS  Have your blood pressure rechecked as directed by your health care provider.   Take medicines only as directed by your health care provider. Follow the directions carefully. Blood pressure medicines must be taken as prescribed. The medicine does not work as well when you skip doses. Skipping doses also puts you at risk for problems.   Do not smoke.   Monitor your blood pressure at home as directed by your health care provider. SEEK MEDICAL CARE IF:   You think you are having a reaction to medicines taken.  You have recurrent headaches or feel dizzy.  You have swelling in your ankles.  You have trouble with your vision. SEEK IMMEDIATE MEDICAL CARE IF:  You develop a severe headache or confusion.    You have unusual weakness, numbness, or feel faint.  You have severe chest or abdominal pain.  You vomit repeatedly.  You have trouble breathing. MAKE SURE YOU:   Understand these instructions.  Will watch your condition.  Will get help right away if you are not doing well or get worse. Document  Released: 08/17/2005 Document Revised: 01/01/2014 Document Reviewed: 06/09/2013 ExitCare Patient Information 2015 [employer]. This information is not intended to replace advice given to you by your health care provider. Make sure you discuss any questions you have with your health care provider. DASH Eating Plan DASH stands for "Dietary Approaches to Stop Hypertension." The DASH eating plan is a healthy eating plan that has been shown to reduce high blood pressure (hypertension). Additional health benefits may include reducing the risk of type 2 diabetes mellitus, heart disease, and stroke. The DASH eating plan may also help with weight loss. WHAT DO I NEED TO KNOW ABOUT THE DASH EATING PLAN? For the DASH eating plan, you will follow these general guidelines:  Choose foods with a percent daily value for sodium of less than 5% (as listed on the food label).  Use salt-free seasonings or herbs instead of table salt or sea salt.  Check with your health care provider or pharmacist before using salt substitutes.  Eat lower-sodium products, often labeled as "lower sodium" or "no salt added."  Eat fresh foods.  Eat more vegetables, fruits, and low-fat dairy products.  Choose whole grains. Look for the word "whole" as the first word in the ingredient list.  Choose fish and skinless chicken or turkey more often than red meat. Limit fish, poultry, and meat to 6 oz (170 g) each day.  Limit sweets, desserts, sugars, and sugary drinks.  Choose heart-healthy fats.  Limit cheese to 1 oz (28 g) per day.  Eat more home-cooked food and less restaurant, buffet, and fast food.  Limit fried foods.  Cook foods using methods other than frying.  Limit canned vegetables. If you do use them, rinse them well to decrease the sodium.  When eating at a restaurant, ask that your food be prepared with less salt, or no salt if possible. WHAT FOODS CAN I EAT? Seek help from a dietitian for individual  calorie needs. Grains Whole grain or whole wheat bread. Brown rice. Whole grain or whole wheat pasta. Quinoa, bulgur, and whole grain cereals. Low-sodium cereals. Corn or whole wheat flour tortillas. Whole grain cornbread. Whole grain crackers. Low-sodium crackers. Vegetables Fresh or frozen vegetables (raw, steamed, roasted, or grilled). Low-sodium or reduced-sodium tomato and vegetable juices. Low-sodium or reduced-sodium tomato sauce and paste. Low-sodium or reduced-sodium canned vegetables.  Fruits All fresh, canned (in natural juice), or frozen fruits. Meat and Other Protein Products Ground beef (85% or leaner), grass-fed beef, or beef trimmed of fat. Skinless chicken or turkey. Ground chicken or turkey. Pork trimmed of fat. All fish and seafood. Eggs. Dried beans, peas, or lentils. Unsalted nuts and seeds. Unsalted canned beans. Dairy Low-fat dairy products, such as skim or 1% milk, 2% or reduced-fat cheeses, low-fat ricotta or cottage cheese, or plain low-fat yogurt. Low-sodium or reduced-sodium cheeses. Fats and Oils Tub margarines without trans fats. Light or reduced-fat mayonnaise and salad dressings (reduced sodium). Avocado. Safflower, olive, or canola oils. Natural peanut or almond butter. Other Unsalted popcorn and pretzels. The items listed above may not be a complete list of recommended foods or beverages. Contact your dietitian for more options. WHAT FOODS ARE NOT RECOMMENDED? Grains White bread.   White pasta. White rice. Refined cornbread. Bagels and croissants. Crackers that contain trans fat. Vegetables Creamed or fried vegetables. Vegetables in a cheese sauce. Regular canned vegetables. Regular canned tomato sauce and paste. Regular tomato and vegetable juices. Fruits Dried fruits. Canned fruit in light or heavy syrup. Fruit juice. Meat and Other Protein Products Fatty cuts of meat. Ribs, chicken wings, bacon, sausage, bologna, salami, chitterlings, fatback, hot dogs,  bratwurst, and packaged luncheon meats. Salted nuts and seeds. Canned beans with salt. Dairy Whole or 2% milk, cream, half-and-half, and cream cheese. Whole-fat or sweetened yogurt. Full-fat cheeses or blue cheese. Nondairy creamers and whipped toppings. Processed cheese, cheese spreads, or cheese curds. Condiments Onion and garlic salt, seasoned salt, table salt, and sea salt. Canned and packaged gravies. Worcestershire sauce. Tartar sauce. Barbecue sauce. Teriyaki sauce. Soy sauce, including reduced sodium. Steak sauce. Fish sauce. Oyster sauce. Cocktail sauce. Horseradish. Ketchup and mustard. Meat flavorings and tenderizers. Bouillon cubes. Hot sauce. Tabasco sauce. Marinades. Taco seasonings. Relishes. Fats and Oils Butter, stick margarine, lard, shortening, ghee, and bacon fat. Coconut, palm kernel, or palm oils. Regular salad dressings. Other Pickles and olives. Salted popcorn and pretzels. The items listed above may not be a complete list of foods and beverages to avoid. Contact your dietitian for more information. WHERE CAN I FIND MORE INFORMATION? National Heart, Lung, and [employer] Institute: www.nhlbi.nih.gov/health/health-topics/topics/dash/ Document Released: 08/06/2011 Document Revised: 01/01/2014 Document Reviewed: 06/21/2013 ExitCare Patient Information 2015 [employer]. This information is not intended to replace advice given to you by your health care provider. Make sure you discuss any questions you have with your health care provider.  

## 2014-09-28 ENCOUNTER — Ambulatory Visit: Payer: Self-pay | Attending: Family Medicine | Admitting: Family Medicine

## 2014-09-28 ENCOUNTER — Encounter: Payer: Self-pay | Admitting: Family Medicine

## 2014-09-28 VITALS — BP 130/84 | HR 65 | Temp 98.3°F | Resp 16 | Ht 63.0 in | Wt 125.0 lb

## 2014-09-28 DIAGNOSIS — I10 Essential (primary) hypertension: Secondary | ICD-10-CM

## 2014-09-28 DIAGNOSIS — I150 Renovascular hypertension: Secondary | ICD-10-CM

## 2014-09-28 DIAGNOSIS — N183 Chronic kidney disease, stage 3 unspecified: Secondary | ICD-10-CM

## 2014-09-28 MED ORDER — AMLODIPINE BESYLATE 5 MG PO TABS: 5.0000 mg | ORAL_TABLET | Freq: Every day | ORAL | Status: DC

## 2014-09-28 MED ORDER — CLONIDINE HCL 0.1 MG PO TABS: 0.1000 mg | ORAL_TABLET | Freq: Two times a day (BID) | ORAL | Status: DC

## 2014-09-28 MED ORDER — LOSARTAN POTASSIUM 50 MG PO TABS: 50.0000 mg | ORAL_TABLET | Freq: Every day | ORAL | Status: DC

## 2014-09-28 NOTE — Progress Notes (Signed)
   Subjective:    Patient ID: Rebecca Marquez, female    DOB: 05/27/1961, 53 y.o.   MRN: [id number] CC: HTN f/u  HPI 54 yo F:  1. CHRONIC HYPERTENSION  Disease Monitoring  Blood pressure range: does not check   Chest pain: no   Dyspnea: no   Claudication: no   Medication compliance: yes, norvasc 5 daily  Medication Side Effects  Lightheadedness: no   Urinary frequency: no   Edema: no   Headache: yes, mild tension type headache  Soc Hx: former smoker,quit in 2003  Review of Systems As per HPI     Objective:   Physical Exam BP 130/84 mmHg  Pulse 65  Temp(Src) 98.3 F (36.8 C) (Oral)  Resp 16  Ht 5\' 3"  (1.6 m)  Wt 125 lb (56.7 kg)  BMI 22.15 kg/m2  SpO2 97%  BP Readings from Last 3 Encounters:  09/28/14 130/84  08/10/14 108/69  08/03/14 145/101  General appearance: alert, cooperative and no distress Chest: normal WOB, no distress  Extremities: extremities normal, atraumatic, no cyanosis or edema    Assessment & Plan:

## 2014-09-28 NOTE — Assessment & Plan Note (Addendum)
1, HTN: your BP is great today but since you are having headaches I would like to make some changes. Plan: Start losartan 50 mg once daily, this is a BP medicine that works at the kidneys to lower BP.  Continue Norvasc 5 mg, I recommend that you take this one at night.  Decrease clonidine to 0.1 mg twice daily for the next 7 days, then once daily, then stop.   F/u  With nurse in 2-3 weeks, BMP to check Cr (kidney function #) at this f/u visit.  With me in 6 weeks

## 2014-09-28 NOTE — Patient Instructions (Signed)
Ms. Salters,  Thank you so much for coming back in to see me today. I am sorry to hear about your daughter's diagnosis, stay encouraged, MS is very manageable.    1, HTN: your BP is great today but since you are having headaches I would like to make some changes. Plan: Start losartan 50 mg once daily, this is a BP medicine that works at the kidneys to lower BP.  Continue Norvasc 5 mg, I recommend that you take this one at night.  Decrease clonidine to 0.1 mg twice daily for the next 7 days, then once daily, then stop.   You will be called with lab results  F/u With nurse in 2-3 weeks, BMP to check Cr (kidney function #) at this f/u visit.  With me in 6 weeks   Dr. Adrian Blackwater

## 2014-09-28 NOTE — Progress Notes (Signed)
Pt here to f/u with BP med management States with taking Amlodipine 10 mg tab and Clonidine 0.1 mg she feels dizzy with headaches at time Negative orthostats Denies n/v or feeling faint  BP- 130/84 65

## 2014-09-29 LAB — MICROALBUMIN / CREATININE URINE RATIO
Creatinine, Urine: 242 mg/dL
MICROALB/CREAT RATIO: 9.9 mg/g (ref 0.0–30.0)
Microalb, Ur: 2.4 mg/dL — ABNORMAL HIGH (ref <2.0)

## 2014-10-04 ENCOUNTER — Telehealth: Payer: Self-pay | Admitting: *Deleted

## 2014-10-04 NOTE — Telephone Encounter (Signed)
Unable to LVM.

## 2014-10-04 NOTE — Telephone Encounter (Signed)
-----   Message from Minerva Ends, MD sent at 10/01/2014  9:20 AM EST ----- Normal urine microalbumin continue current plan for BP control

## 2014-10-22 ENCOUNTER — Ambulatory Visit: Payer: Self-pay

## 2014-12-09 ENCOUNTER — Emergency Department (HOSPITAL_COMMUNITY)
Admission: EM | Admit: 2014-12-09 | Discharge: 2014-12-09 | Disposition: A | Discharge status: Home / Self Care | Payer: Self-pay | Attending: Emergency Medicine | Admitting: Emergency Medicine

## 2014-12-09 ENCOUNTER — Encounter (HOSPITAL_COMMUNITY): Payer: Self-pay | Admitting: *Deleted

## 2014-12-09 DIAGNOSIS — I10 Essential (primary) hypertension: Secondary | ICD-10-CM | POA: Insufficient documentation

## 2014-12-09 DIAGNOSIS — M791 Myalgia: Secondary | ICD-10-CM | POA: Insufficient documentation

## 2014-12-09 DIAGNOSIS — Z87891 Personal history of nicotine dependence: Secondary | ICD-10-CM | POA: Insufficient documentation

## 2014-12-09 DIAGNOSIS — K146 Glossodynia: Secondary | ICD-10-CM | POA: Insufficient documentation

## 2014-12-09 DIAGNOSIS — Z79899 Other long term (current) drug therapy: Secondary | ICD-10-CM | POA: Insufficient documentation

## 2014-12-09 DIAGNOSIS — Z8709 Personal history of other diseases of the respiratory system: Secondary | ICD-10-CM | POA: Insufficient documentation

## 2014-12-09 MED ORDER — PENICILLIN V POTASSIUM 250 MG PO TABS: 250.0000 mg | ORAL_TABLET | Freq: Four times a day (QID) | ORAL | Status: AC

## 2014-12-09 MED ORDER — MAGIC MOUTHWASH W/LIDOCAINE: 5.0000 mL | Freq: Three times a day (TID) | ORAL | Status: DC | PRN

## 2014-12-09 NOTE — ED Provider Notes (Signed)
CSN: [id number]     Arrival date & time 12/09/14  1354 History  This chart was scribed for non-physician practitioner, Montine Circle, PA-C working with Pamella Pert, MD by Tula Nakayama, ED scribe. This patient was seen in room TR04C/TR04C and the patient's care was started at 4:23 PM   Chief Complaint  Patient presents with  . Pain   The history is provided by the patient. No language interpreter was used.    HPI Comments: Rebecca Marquez is a 54 y.o. female with a history of allergies and HTN who presents to the Emergency Department complaining of constant, moderate tongue pain that started 2.5 weeks ago. She tried [employer] mouthwash with no relief. Pt states pain becomes worse with eating. She reports using "weed," but no other drugs.  Pt also denies swelling as an associated symptom.  Past Medical History  Diagnosis Date  . Seasonal allergies   . Hypertension Dx March 2015   Past Surgical History  Procedure Laterality Date  . Tubal ligation    . Tubal ligation     Family History  Problem Relation Age of Onset  . Diabetes Mother   . Multiple sclerosis Daughter 38   History  Substance Use Topics  . Smoking status: Former Smoker -- 0.50 packs/day for 20 years    Types: Cigarettes    Quit date: 08/31/2001  . Smokeless tobacco: Never Used  . Alcohol Use: No   OB History    No data available     Review of Systems  Constitutional: Negative for fever and chills.  Respiratory: Negative for shortness of breath.   Cardiovascular: Negative for chest pain.  Gastrointestinal: Negative for nausea, vomiting, diarrhea and constipation.  Genitourinary: Negative for dysuria.  Musculoskeletal: Positive for myalgias.  Skin: Negative for wound.    Allergies  Review of patient's allergies indicates no known allergies.  Home Medications   Prior to Admission medications   Medication Sig Start Date End Date Taking? Authorizing Provider  acetaminophen (TYLENOL) 500 MG tablet  Take 1,000 mg by mouth every 6 (six) hours as needed for moderate pain.    Historical Provider, MD  amLODipine (NORVASC) 5 MG tablet Take 1 tablet (5 mg total) by mouth daily. 09/28/14   Boykin Nearing, MD  aspirin 325 MG tablet Take 650 mg by mouth every 6 (six) hours as needed.     Historical Provider, MD  cloNIDine (CATAPRES) 0.1 MG tablet Take 1 tablet (0.1 mg total) by mouth 2 (two) times daily. 09/28/14   Josalyn Funches, MD  losartan (COZAAR) 50 MG tablet Take 1 tablet (50 mg total) by mouth daily. 09/28/14   Josalyn Funches, MD   BP 145/99 mmHg  Pulse 60  Temp(Src) 97.9 F (36.6 C) (Oral)  Resp 20  Ht 5' 3.5" (1.613 m)  SpO2 100% Physical Exam  Constitutional: She appears well-developed and well-nourished. No distress.  HENT:  Head: Normocephalic and atraumatic.  Poor dentition throughout.    No signs of peritonsillar or tonsillar abscess.  No signs of gingival abscess. Oropharynx is clear and without exudates.  Uvula is midline.  Airway is intact. No signs of Ludwig's angina with palpation of oral and sublingual mucosa.   Eyes: Conjunctivae and EOM are normal.  Neck: Neck supple. No tracheal deviation present.  Cardiovascular: Normal rate.   Pulmonary/Chest: Effort normal. No respiratory distress.  Skin: Skin is warm and dry.  Psychiatric: She has a normal mood and affect. Her behavior is normal.  Nursing note and vitals  reviewed.   ED Course  Procedures   DIAGNOSTIC STUDIES: Oxygen Saturation is 100% on RA, normal by my interpretation.    COORDINATION OF CARE: 4:25 PM Discussed treatment plan with pt which includes follow-up with a dentist. Pt agreed to plan.   Labs Review Labs Reviewed - No data to display  Imaging Review No results found.   EKG Interpretation None      MDM   Final diagnoses:  Tongue pain    Patient with tongue pain, no sign of ludwigs, no obvious ulcer or wound or lesion, no thrush, no swelling, no other complaints.  Will treat with  magic mouthwash and recommend oral follow-up.  I personally performed the services described in this documentation, which was scribed in my presence. The recorded information has been reviewed and is accurate.     Montine Circle, PA-C 12/09/14 [place], MD 12/09/14 [phone]

## 2014-12-09 NOTE — ED Notes (Signed)
Pt reports pain to her tongue for several days, denies swelling. No redness noted. Airway intact.

## 2014-12-09 NOTE — ED Notes (Signed)
Pt c/o tongue pain x 2.5 weeks. Has attempted to use mouthwash without relief. Also states salt worsens pain

## 2014-12-09 NOTE — Discharge Instructions (Signed)
Dental Care and Dentist Visits  Dental care supports good overall health. Regular dental visits can also help you avoid dental pain, bleeding, infection, and other more serious health problems in the future. It is important to keep the mouth healthy because diseases in the teeth, gums, and other oral tissues can spread to other areas of the body. Some problems, such as diabetes, heart disease, and pre-term labor have been associated with poor oral health.   See your dentist every 6 months. If you experience emergency problems such as a toothache or broken tooth, go to the dentist right away. If you see your dentist regularly, you may catch problems early. It is easier to be treated for problems in the early stages.   WHAT TO EXPECT AT A DENTIST VISIT   Your dentist will look for many common oral health problems and recommend proper treatment. At your regular dental visit, you can expect:  · Gentle cleaning of the teeth and gums. This includes scraping and polishing. This helps to remove the sticky substance around the teeth and gums (plaque). Plaque forms in the mouth shortly after eating. Over time, plaque hardens on the teeth as tartar. If tartar is not removed regularly, it can cause problems. Cleaning also helps remove stains.  · Periodic X-rays. These pictures of the teeth and supporting bone will help your dentist assess the health of your teeth.  · Periodic fluoride treatments. Fluoride is a natural mineral shown to help strengthen teeth. Fluoride treatment involves applying a fluoride gel or varnish to the teeth. It is most commonly done in children.  · Examination of the mouth, tongue, jaws, teeth, and gums to look for any oral health problems, such as:  ¨ Cavities (dental caries). This is decay on the tooth caused by plaque, sugar, and acid in the mouth. It is best to catch a cavity when it is small.  ¨ Inflammation of the gums caused by plaque buildup (gingivitis).  ¨ Problems with the mouth or malformed  or misaligned teeth.  ¨ Oral cancer or other diseases of the soft tissues or jaws.   KEEP YOUR TEETH AND GUMS HEALTHY  For healthy teeth and gums, follow these general guidelines as well as your dentist's specific advice:  · Have your teeth professionally cleaned at the dentist every 6 months.  · Brush twice daily with a fluoride toothpaste.  · Floss your teeth daily.   · Ask your dentist if you need fluoride supplements, treatments, or fluoride toothpaste.  · Eat a healthy diet. Reduce foods and drinks with added sugar.  · Avoid smoking.  TREATMENT FOR ORAL HEALTH PROBLEMS  If you have oral health problems, treatment varies depending on the conditions present in your teeth and gums.  · Your caregiver will most likely recommend good oral hygiene at each visit.  · For cavities, gingivitis, or other oral health disease, your caregiver will perform a procedure to treat the problem. This is typically done at a separate appointment. Sometimes your caregiver will refer you to another dental specialist for specific tooth problems or for surgery.  SEEK IMMEDIATE DENTAL CARE IF:  · You have pain, bleeding, or soreness in the gum, tooth, jaw, or mouth area.  · A permanent tooth becomes loose or separated from the gum socket.  · You experience a blow or injury to the mouth or jaw area.  Document Released: 04/29/2011 Document Revised: 11/09/2011 Document Reviewed: 04/29/2011  ExitCare® Patient Information ©2015 [employer], [employer]. This information is not intended to replace advice   given to you by your health care provider. Make sure you discuss any questions you have with your health care provider.

## 2015-01-30 ENCOUNTER — Other Ambulatory Visit: Payer: Self-pay | Admitting: Internal Medicine

## 2015-02-05 ENCOUNTER — Other Ambulatory Visit: Payer: Self-pay | Admitting: Family Medicine

## 2015-02-05 DIAGNOSIS — I10 Essential (primary) hypertension: Secondary | ICD-10-CM

## 2015-02-05 MED ORDER — AMLODIPINE BESYLATE 5 MG PO TABS: 5.0000 mg | ORAL_TABLET | Freq: Every day | ORAL | Status: DC

## 2015-02-05 NOTE — Telephone Encounter (Signed)
Patient called requesting medication refill on amLODipine (NORVASC) 5 MG tablet . Pt states she has been out of medication for over a week and would like enough until next week 02/15/15. Please f/u with patient

## 2015-02-15 ENCOUNTER — Ambulatory Visit: Payer: Self-pay | Admitting: Family Medicine

## 2015-03-01 ENCOUNTER — Telehealth: Payer: Self-pay | Admitting: Family Medicine

## 2015-03-01 DIAGNOSIS — I150 Renovascular hypertension: Secondary | ICD-10-CM

## 2015-03-01 DIAGNOSIS — I10 Essential (primary) hypertension: Secondary | ICD-10-CM

## 2015-03-01 NOTE — Telephone Encounter (Signed)
Patient called and left a message on the nurse's line in the beginning of June requesting a refill on blood pressure medication.  Patient stated she has been out for over 1 week, and that her last office visit with Dr. Adrian Blackwater was in January, 2016.  Please f/u with patient.

## 2015-03-05 ENCOUNTER — Other Ambulatory Visit: Payer: Self-pay | Admitting: *Deleted

## 2015-03-05 ENCOUNTER — Telehealth: Payer: Self-pay | Admitting: Family Medicine

## 2015-03-05 DIAGNOSIS — I150 Renovascular hypertension: Secondary | ICD-10-CM

## 2015-03-05 MED ORDER — CLONIDINE HCL 0.1 MG PO TABS: 0.1000 mg | ORAL_TABLET | Freq: Two times a day (BID) | ORAL | Status: DC

## 2015-03-05 MED ORDER — LOSARTAN POTASSIUM 50 MG PO TABS: 50.0000 mg | ORAL_TABLET | Freq: Every day | ORAL | Status: DC

## 2015-03-05 NOTE — Telephone Encounter (Signed)
RX clonidine refilled one month supply only Pt need to schedule appointment with PCP for futures refills Pt aware, transfer to front office to schedule F/U appointment

## 2015-03-05 NOTE — Telephone Encounter (Signed)
Patient called requesting medication refill on cloNIDine (CATAPRES) 0.1 MG tablet. Patient would like medication sent to [facility] on [facility]. Pt states she is completely out of medication,please f/u

## 2015-03-12 ENCOUNTER — Ambulatory Visit: Payer: Self-pay | Admitting: Family Medicine

## 2015-03-19 ENCOUNTER — Ambulatory Visit: Payer: Self-pay | Attending: Family Medicine | Admitting: Family Medicine

## 2015-03-19 ENCOUNTER — Encounter: Payer: Self-pay | Admitting: Family Medicine

## 2015-03-19 VITALS — BP 116/76 | HR 67 | Temp 98.1°F | Resp 16 | Ht 63.5 in | Wt 118.0 lb

## 2015-03-19 DIAGNOSIS — I10 Essential (primary) hypertension: Secondary | ICD-10-CM | POA: Insufficient documentation

## 2015-03-19 DIAGNOSIS — I150 Renovascular hypertension: Secondary | ICD-10-CM

## 2015-03-19 DIAGNOSIS — Z0000 Encounter for general adult medical examination without abnormal findings: Secondary | ICD-10-CM | POA: Insufficient documentation

## 2015-03-19 MED ORDER — CLONIDINE HCL 0.1 MG PO TABS: 0.1000 mg | ORAL_TABLET | Freq: Three times a day (TID) | ORAL | Status: DC

## 2015-03-19 MED ORDER — AMLODIPINE BESYLATE 5 MG PO TABS: 5.0000 mg | ORAL_TABLET | Freq: Every day | ORAL | Status: DC

## 2015-03-19 NOTE — Assessment & Plan Note (Signed)
  GI referral placed for screening colonoscopy Mammogram ordered, please call to schedule

## 2015-03-19 NOTE — Progress Notes (Signed)
   Subjective:    Patient ID: Rebecca Marquez, female    DOB: Oct 01, 1960, 54 y.o.   MRN: [id number] CC: f/u HTN  HPI  1 CHRONIC HYPERTENSION  Disease Monitoring  Blood pressure range: not checking   Chest pain: no   Dyspnea: no   Claudication: no   Medication compliance: yes, except not taking losartan and taking clonidine TID Medication Side Effects  Lightheadedness: no   Urinary frequency: no   Edema: no    Preventitive Healthcare:  Exercise: no   Diet Pattern:  Regular meals   Salt Restriction: yes    Soc Hx: non smoker  Review of Systems  Constitutional: Negative for fever and chills.  Respiratory: Negative for shortness of breath.   Cardiovascular: Negative for chest pain.  Gastrointestinal: Negative for abdominal pain and blood in stool.  Skin: Negative for rash.  Psychiatric/Behavioral: Negative for suicidal ideas and dysphoric mood.       Objective:   Physical Exam BP 116/76 mmHg  Pulse 67  Temp(Src) 98.1 F (36.7 C) (Oral)  Resp 16  Ht 5' 3.5" (1.613 m)  Wt 118 lb (53.524 kg)  BMI 20.57 kg/m2  SpO2 99% General appearance: alert, cooperative and no distress Neck: no adenopathy, supple, symmetrical, trachea midline and thyroid not enlarged, symmetric, no tenderness/mass/nodules Lungs: clear to auscultation bilaterally Heart: regular rate and rhythm, S1, S2 normal, no murmur, click, rub or gallop Extremities: extremities normal, atraumatic, no cyanosis or edema      Assessment & Plan:

## 2015-03-19 NOTE — Assessment & Plan Note (Signed)
BP is great Keep up the good work Continue current regimen

## 2015-03-19 NOTE — Patient Instructions (Addendum)
Ms, Rebecca Marquez,  Thank you for coming in today BP is great Keep up the good work Continue current regimen  GI referral placed for screening colonoscopy Mammogram ordered, please call to schedule  F/u 4-6 weeks for pap and Tdap   F/u with me in 3 months for HTN  Dr. Adrian Blackwater

## 2015-03-19 NOTE — Progress Notes (Signed)
F/U DM  No Hx tobacco

## 2015-04-29 ENCOUNTER — Encounter: Payer: Self-pay | Admitting: Family Medicine

## 2015-04-29 ENCOUNTER — Ambulatory Visit: Payer: Self-pay | Attending: Family Medicine | Admitting: Family Medicine

## 2015-04-29 VITALS — BP 133/88 | HR 74 | Temp 97.8°F | Resp 16 | Ht 63.5 in | Wt 122.0 lb

## 2015-04-29 DIAGNOSIS — N183 Chronic kidney disease, stage 3 unspecified: Secondary | ICD-10-CM

## 2015-04-29 DIAGNOSIS — K140 Glossitis: Secondary | ICD-10-CM | POA: Insufficient documentation

## 2015-04-29 DIAGNOSIS — Z124 Encounter for screening for malignant neoplasm of cervix: Secondary | ICD-10-CM | POA: Insufficient documentation

## 2015-04-29 DIAGNOSIS — Z1159 Encounter for screening for other viral diseases: Secondary | ICD-10-CM | POA: Insufficient documentation

## 2015-04-29 DIAGNOSIS — Z114 Encounter for screening for human immunodeficiency virus [HIV]: Secondary | ICD-10-CM | POA: Insufficient documentation

## 2015-04-29 LAB — BASIC METABOLIC PANEL
BUN: 30 mg/dL — ABNORMAL HIGH (ref 7–25)
CO2: 25 mmol/L (ref 20–31)
Calcium: 9.7 mg/dL (ref 8.6–10.4)
Chloride: 103 mmol/L (ref 98–110)
Creat: 2.09 mg/dL — ABNORMAL HIGH (ref 0.50–1.05)
GLUCOSE: 90 mg/dL (ref 65–99)
POTASSIUM: 4.6 mmol/L (ref 3.5–5.3)
SODIUM: 138 mmol/L (ref 135–146)

## 2015-04-29 LAB — IRON AND TIBC
%SAT: 19 % (ref 11–50)
IRON: 61 ug/dL (ref 45–160)
TIBC: 318 ug/dL (ref 250–450)
UIBC: 257 ug/dL (ref 125–400)

## 2015-04-29 LAB — VITAMIN B12: Vitamin B-12: 814 pg/mL (ref 211–911)

## 2015-04-29 LAB — FERRITIN: FERRITIN: 82 ng/mL (ref 10–291)

## 2015-04-29 LAB — HEPATITIS C ANTIBODY: HCV AB: NEGATIVE

## 2015-04-29 LAB — HIV ANTIBODY (ROUTINE TESTING W REFLEX): HIV 1&2 Ab, 4th Generation: NONREACTIVE

## 2015-04-29 LAB — FOLATE: Folate: 3.6 ng/mL

## 2015-04-29 NOTE — Assessment & Plan Note (Signed)
Screening HIV ordered  

## 2015-04-29 NOTE — Assessment & Plan Note (Signed)
A: CKD in setting of HTN. BP well controlled P: BMP today

## 2015-04-29 NOTE — Assessment & Plan Note (Signed)
Hep C ordered.

## 2015-04-29 NOTE — Progress Notes (Signed)
Annual physical and pap smear No vaginal discharge no odor  C/C tongue painful with bumps  No tobacco user Drug- Marijuana last used yesterday

## 2015-04-29 NOTE — Progress Notes (Signed)
   Subjective:    Patient ID: Rebecca Marquez, female    DOB: Oct 11, 1960, 54 y.o.   MRN: [id number] CC: wellness visit with pap, bumps on her tongue  HPI   1. Bumps on tongue: x 3 months. Intermittently painful. Exacerbated for spicy foods. Tip of tongue only. No ulcerations. Patient does not have dental insurance and has not seen a dentist in many years.   2. Wellness: due for pap, mammogram, screening colonoscopy. Currently uninsured.   Social History  Substance Use Topics  . Smoking status: Former Smoker -- 0.50 packs/day for 20 years    Types: Cigarettes    Quit date: 08/31/2001  . Smokeless tobacco: Never Used  . Alcohol Use: No   Review of Systems  Constitutional: Negative for fever and chills.  HENT: Negative for mouth sores.        Bumps on tip of tongue   Eyes: Negative for visual disturbance.  Respiratory: Negative for shortness of breath.   Cardiovascular: Negative for chest pain.  Gastrointestinal: Negative for abdominal pain and blood in stool.  Musculoskeletal: Negative for back pain and arthralgias.  Skin: Positive for rash.       Rash on arms. Chest, low back w/o pruritus   Allergic/Immunologic: Negative for immunocompromised state.  Hematological: Negative for adenopathy. Does not bruise/bleed easily.  Psychiatric/Behavioral: Negative for suicidal ideas and dysphoric mood.  GAD-7: score of 1. 1-6.      Objective:   Physical Exam  Constitutional: She appears well-developed and well-nourished. No distress.  HENT:  Mouth/Throat: Oropharynx is clear and moist.    Cardiovascular: Normal rate, regular rhythm, normal heart sounds and intact distal pulses.   Pulmonary/Chest: Effort normal and breath sounds normal.  Genitourinary: Vagina normal and uterus normal. Pelvic exam was performed with patient prone. There is no rash, tenderness or lesion on the right labia. There is no rash, tenderness or lesion on the left labia. Cervix exhibits no motion tenderness, no  discharge and no friability.  Musculoskeletal: She exhibits no edema.  Lymphadenopathy:       Right: No inguinal adenopathy present.       Left: No inguinal adenopathy present.  Skin: Skin is warm and dry. Rash noted.  Macular rash on arms, chest, low back  BP 133/88 mmHg  Pulse 74  Temp(Src) 97.8 F (36.6 C) (Oral)  Resp 16  Ht 5' 3.5" (1.613 m)  Wt 122 lb (55.339 kg)  BMI 21.27 kg/m2  SpO2 98% Wt Readings from Last 3 Encounters:  04/29/15 122 lb (55.339 kg)  03/19/15 118 lb (53.524 kg)  09/28/14 125 lb (56.7 kg)   BP Readings from Last 3 Encounters:  04/29/15 133/88  03/19/15 116/76  12/09/14 145/99   Lab Results  Component Value Date   CREATININE 1.92* 08/03/2014   CREATININE 2.19* 04/04/2014   CREATININE 2.24* 11/16/2013           Assessment & Plan:

## 2015-04-29 NOTE — Assessment & Plan Note (Signed)
Pap done today  

## 2015-04-29 NOTE — Assessment & Plan Note (Signed)
Tongue soreness: This is called glossitis. There are a number of potential causes, including vitamin deficiency, spicy foods and infection. There are no signs of infection. Checking vit B12,folic acid and iron level Recommend dental visit for cleaning and check up

## 2015-04-29 NOTE — Patient Instructions (Addendum)
Rebecca Marquez,  Thank you for coming back in to see me today  1. Tongue soreness: This is called glossitis. There are a number of potential causes, including vitamin deficiency, spicy foods and infection. There are no signs of infection. Checking vit B12,folic acid and iron level Recommend dental visit for cleaning and check up   2. Pap done today  3. HTN with chronic kidney disease: BP is well controlled Checking BMP today   4. Healthcare maintenance: Due for mammogram, colonoscopy, screening HIV and Hep C   Please apply for [employer] discount and orange card, you can also inquire if any of your medications are on the PASS (medications assistance) list.   F/u next month for flu shot, flu clinic F/u with me in 3 months   Dr. Adrian Blackwater  Glossitis Glossitis is an inflammation of the tongue. Changes in the appearance of the tongue may be a primary tongue disorder. This means the problem is only in the tongue. Glossitis may be a symptom of other disorders. CAUSES   Excessive alcohol.  Multiple allergies.  Infections.  Tobacco and nicotine use.  Anemia.  Mechanical injury.  Spicy foods.  Vitamin B deficiency.  Damage from chemicals or hot food or drink. SYMPTOMS  There may be swelling and color changes in the tongue. Sometimes the surface of the tongue may look smooth. This disorder may be painless. But the tongue is usually sore and tender. It can be fiery red if condition is caused by deficiency of B vitamins. It is sometimes pale if there is anemia. Anemia means there are not enough red blood cells. There may be problems with chewing, swallowing and speaking. In some cases, glossitis may result in severe tongue swelling that blocks the airway. DIAGNOSIS  The diagnosis of glossitis is made easily by physical exam and asking for a history. Sometimes blood tests may be done. TREATMENT   The goal of treatment is to reduce inflammation. Hospitalization is usually not  necessary unless tongue swelling is severe.  Good oral hygiene is important. This means good tooth brushing at least twice a day, and flossing daily for treatment and prevention.  Corticosteroids such as prednisone may be given to reduce the redness and soreness.  Medications may be prescribed if the cause of glossitis is an infection.  Other problems such as anemia and nutritional deficiencies are treated. This may be a dietary change or vitamin supplements. Avoid hot or spicy foods, alcohol, and tobacco. This lessens the discomfort.  Avoid anything that is irritating to your mouth or tongue. Glossitis usually responds well to treatment if the cause of it is removed or treated.  SEEK IMMEDIATE MEDICAL CARE IF:   Symptoms of glossitis persist for longer than 10 days.  Tongue swelling is severe and breathing, speaking, chewing, or swallowing difficulties are present.  You have no relief from medications given.  You develop difficulties breathing. Document Released: 08/07/2002 Document Revised: 11/09/2011 Document Reviewed: 09/21/2008 [facility] Patient Information 2015 [place], [place]. This information is not intended to replace advice given to you by your health care provider. Make sure you discuss any questions you have with your health care provider.

## 2015-04-30 LAB — CERVICOVAGINAL ANCILLARY ONLY
Chlamydia: NEGATIVE
NEISSERIA GONORRHEA: NEGATIVE
WET PREP (BD AFFIRM): POSITIVE — AB

## 2015-04-30 LAB — CYTOLOGY - PAP

## 2015-05-01 ENCOUNTER — Telehealth: Payer: Self-pay | Admitting: *Deleted

## 2015-05-01 NOTE — Telephone Encounter (Signed)
Patient called returning nurse's call to review results. Please f/u  °

## 2015-05-01 NOTE — Telephone Encounter (Signed)
-----   Message from Boykin Nearing, MD sent at 04/30/2015  8:46 AM EDT ----- All labs normal except for elevated Cr which is stable

## 2015-05-01 NOTE — Telephone Encounter (Signed)
-----   Message from Boykin Nearing, MD sent at 04/30/2015 11:59 AM EDT ----- Gc.chlam negative

## 2015-05-01 NOTE — Telephone Encounter (Signed)
-----   Message from Boykin Nearing, MD sent at 04/30/2015  2:50 PM EDT ----- Negative pap, repeat in 3 years

## 2015-05-01 NOTE — Telephone Encounter (Signed)
LVM to return call.

## 2015-05-03 NOTE — Telephone Encounter (Signed)
Date of Birth verified by Pt Pap smear, labs, GC and wep results given to pt Pt verbalized understanding

## 2015-06-25 ENCOUNTER — Encounter (HOSPITAL_COMMUNITY): Payer: Self-pay | Admitting: Emergency Medicine

## 2015-06-25 ENCOUNTER — Emergency Department (HOSPITAL_COMMUNITY)
Admission: EM | Admit: 2015-06-25 | Discharge: 2015-06-25 | Disposition: A | Discharge status: Home / Self Care | Payer: Self-pay | Attending: Physician Assistant | Admitting: Physician Assistant

## 2015-06-25 DIAGNOSIS — I10 Essential (primary) hypertension: Secondary | ICD-10-CM | POA: Insufficient documentation

## 2015-06-25 DIAGNOSIS — Z7982 Long term (current) use of aspirin: Secondary | ICD-10-CM | POA: Insufficient documentation

## 2015-06-25 DIAGNOSIS — Z87891 Personal history of nicotine dependence: Secondary | ICD-10-CM | POA: Insufficient documentation

## 2015-06-25 DIAGNOSIS — K0889 Other specified disorders of teeth and supporting structures: Secondary | ICD-10-CM | POA: Insufficient documentation

## 2015-06-25 DIAGNOSIS — Z760 Encounter for issue of repeat prescription: Secondary | ICD-10-CM | POA: Insufficient documentation

## 2015-06-25 MED ORDER — ACETAMINOPHEN 500 MG PO TABS: 500.0000 mg | ORAL_TABLET | Freq: Four times a day (QID) | ORAL | Status: DC | PRN

## 2015-06-25 MED ORDER — PENICILLIN V POTASSIUM 500 MG PO TABS: 500.0000 mg | ORAL_TABLET | Freq: Four times a day (QID) | ORAL | Status: DC

## 2015-06-25 MED ORDER — AMLODIPINE BESYLATE 5 MG PO TABS: 5.0000 mg | ORAL_TABLET | Freq: Every day | ORAL | Status: DC

## 2015-06-25 NOTE — ED Provider Notes (Signed)
CSN: [id number]     Arrival date & time 06/25/15  1356 History   By signing my name below, I, Erling Conte, attest that this documentation has been prepared under the direction and in the presence of Will Staley Budzinski, PA-C Electronically Signed: Erling Conte, ED Scribe. 06/25/2015. 4:26 PM.    Chief Complaint  Patient presents with  . Dental Pain  . Medication Refill   The history is provided by the patient. No language interpreter was used.    HPI Comments: Rebecca Marquez is a 54 y.o. female who presents to the Emergency Department complaining of constant, 9/10, right lower dental pain onset 1 week. Pt has no associated symptoms. Pt reports she has taken aspirin with no significant relief; but has no medications today.. She states the pain is exacerbated with applied pressure to the tooth. She is current everyday smoker with a 0.5 ppd history for 20 years. She denies any fever, chills, drainage from mouth, sore throat, trouble swallowing, nausea, vomiting, diarrhea, ear pain, ear drainage, or double vision.  Pt reports she is also here for a medication refill on her amlodipine medication. She is currently out of her medication and feels like her blood pressure may be high. She states she can make an appt to see Dr. Adrian Blackwater soon.  Past Medical History  Diagnosis Date  . Seasonal allergies   . Hypertension Dx March 2015   Past Surgical History  Procedure Laterality Date  . Tubal ligation    . Tubal ligation     Family History  Problem Relation Age of Onset  . Diabetes Mother   . Multiple sclerosis Daughter 20   Social History  Substance Use Topics  . Smoking status: Former Smoker -- 0.50 packs/day for 20 years    Types: Cigarettes    Quit date: 08/31/2001  . Smokeless tobacco: Never Used  . Alcohol Use: No   OB History    No data available     Review of Systems  Constitutional: Negative for fever and chills.  HENT: Positive for dental problem. Negative for drooling, ear  discharge, ear pain, facial swelling, mouth sores, sore throat and trouble swallowing.   Eyes: Negative for visual disturbance.  Respiratory: Negative for cough and shortness of breath.   Cardiovascular: Negative for chest pain.  Gastrointestinal: Negative for nausea, vomiting and diarrhea.  Skin: Negative for rash.  Neurological: Negative for dizziness, syncope, weakness, light-headedness, numbness and headaches.      Allergies  Review of patient's allergies indicates no known allergies.  Home Medications   Prior to Admission medications   Medication Sig Start Date End Date Taking? Authorizing Provider  acetaminophen (TYLENOL) 500 MG tablet Take 1 tablet (500 mg total) by mouth every 6 (six) hours as needed. 06/25/15   Waynetta Pean, PA-C  amLODipine (NORVASC) 5 MG tablet Take 1 tablet (5 mg total) by mouth daily. 06/25/15   Waynetta Pean, PA-C  aspirin 325 MG tablet Take 650 mg by mouth every 6 (six) hours as needed.     Historical Provider, MD  cloNIDine (CATAPRES) 0.1 MG tablet Take 1 tablet (0.1 mg total) by mouth 3 (three) times daily. 03/19/15   Josalyn Funches, MD  penicillin v potassium (VEETID) 500 MG tablet Take 1 tablet (500 mg total) by mouth 4 (four) times daily. 06/25/15   Waynetta Pean, PA-C   Triage Vitals: BP 134/91 mmHg  Pulse 87  Temp(Src) 98.3 F (36.8 C) (Oral)  Resp 16  SpO2 99%  Physical Exam  Constitutional: She is oriented to person, place, and time. She appears well-developed and well-nourished. No distress.  Non-toxic appearing.   HENT:  Head: Normocephalic and atraumatic.  Right Ear: External ear normal.  Left Ear: External ear normal.  Mouth/Throat: Uvula is midline and oropharynx is clear and moist. No trismus in the jaw. Abnormal dentition. No dental abscesses or uvula swelling. No oropharyngeal exudate.  Poor dentition. Loose right lower molar. No dental abscess. No induration or discharge. No trismus No discharge from the mouth. No facial  swelling.  Uvula is midline without edema. Soft palate rises symmetrically. No tonsillar hypertrophy or exudates. Tongue protrusion is normal.  Bilateral tympanic membranes are pearly-gray without erythema or loss of landmarks.   Eyes: Conjunctivae and EOM are normal. Pupils are equal, round, and reactive to light. Right eye exhibits no discharge. Left eye exhibits no discharge.  Neck: Normal range of motion. Neck supple. No JVD present. No tracheal deviation present.  Cardiovascular: Normal rate, regular rhythm, normal heart sounds and intact distal pulses.   Pulmonary/Chest: Effort normal and breath sounds normal. No respiratory distress. She has no wheezes. She has no rales.  Abdominal: Soft. There is no tenderness.  Lymphadenopathy:    She has no cervical adenopathy.  Neurological: She is alert and oriented to person, place, and time. No cranial nerve deficit. Coordination normal.  Skin: Skin is warm and dry. No rash noted. She is not diaphoretic. No erythema. No pallor.  Psychiatric: She has a normal mood and affect. Her behavior is normal.  Nursing note and vitals reviewed.   ED Course  Procedures (including critical care time)   DIAGNOSTIC STUDIES: Oxygen Saturation is 99% on RA, normal by my interpretation.    COORDINATION OF CARE:  4:26 PM- Will d/c pt home with rx for Penicillin and Tylenol. Will provide pt with dental referral. Pt advised of plan for treatment and pt agrees.    Labs Review Labs Reviewed - No data to display  Imaging Review No results found.    EKG Interpretation None      Filed Vitals:   06/25/15 1435 06/25/15 1549  BP: 134/91 130/99  Pulse: 87 76  Temp: 98.3 F (36.8 C) 97.9 F (36.6 C)  TempSrc: Oral   Resp: 16 16  SpO2: 99% 99%     MDM   Final diagnoses:  Pain, dental  Essential hypertension   Patient with right lower toothache.  No gross abscess.  Exam unconcerning for Ludwig's angina or spread of infection.  Will treat with  penicillin and pain medicine.  Urged patient to follow-up with dentist Dr. Mariel Sleet. I will also give her 1 refill of amlodipine until seen in follow-up with her primary care provider Dr. Adrian Blackwater. I advised the patient to follow-up with their primary care provider this week. I advised the patient to return to the emergency department with new or worsening symptoms or new concerns. The patient verbalized understanding and agreement with plan.    I personally performed the services described in this documentation, which was scribed in my presence. The recorded information has been reviewed and is accurate.     Waynetta Pean, PA-C 06/25/15 1627  Courteney Julio Alm, MD 06/26/15 [id number]

## 2015-06-25 NOTE — ED Notes (Signed)
Pt sts right sided dental pain x 1 week and out of one of her htn meds

## 2015-06-25 NOTE — ED Notes (Signed)
HALL PASS - CHARTING ERROR.

## 2015-06-25 NOTE — Discharge Instructions (Signed)
Dental Pain Dental pain may be caused by many things, including:  Tooth decay (cavities or caries). Cavities expose the nerve of your tooth to air and hot or cold temperatures. This can cause pain or discomfort.  Abscess or infection. A dental abscess is a collection of infected pus from a bacterial infection in the inner part of the tooth (pulp). It usually occurs at the end of the tooth's root.  Injury.  An unknown reason (idiopathic). Your pain may be mild or severe. It may only occur when:  You are chewing.  You are exposed to hot or cold temperature.  You are eating or drinking sugary foods or beverages, such as soda or candy. Your pain may also be constant. HOME CARE INSTRUCTIONS Watch your dental pain for any changes. The following actions may help to lessen any discomfort that you are feeling:  Take medicines only as directed by your dentist.  If you were prescribed an antibiotic medicine, finish all of it even if you start to feel better.  Keep all follow-up visits as directed by your dentist. This is important.  Do not apply heat to the outside of your face.  Rinse your mouth or gargle with salt water if directed by your dentist. This helps with pain and swelling.  You can make salt water by adding  tsp of salt to 1 cup of warm water.  Apply ice to the painful area of your face:  Put ice in a plastic bag.  Place a towel between your skin and the bag.  Leave the ice on for 20 minutes, 2-3 times per day.  Avoid foods or drinks that cause you pain, such as:  Very hot or very cold foods or drinks.  Sweet or sugary foods or drinks. SEEK MEDICAL CARE IF:  Your pain is not controlled with medicines.  Your symptoms are worse.  You have new symptoms. SEEK IMMEDIATE MEDICAL CARE IF:  You are unable to open your mouth.  You are having trouble breathing or swallowing.  You have a fever.  Your face, neck, or jaw is swollen.   This information is not  intended to replace advice given to you by your health care provider. Make sure you discuss any questions you have with your health care provider.   Document Released: 08/17/2005 Document Revised: 01/01/2015 Document Reviewed: 08/13/2014 Elsevier Interactive Patient Education 2016 [employer].  Hypertension Hypertension, commonly called high blood pressure, is when the force of blood pumping through your arteries is too strong. Your arteries are the blood vessels that carry blood from your heart throughout your body. A blood pressure reading consists of a higher number over a lower number, such as 110/72. The higher number (systolic) is the pressure inside your arteries when your heart pumps. The lower number (diastolic) is the pressure inside your arteries when your heart relaxes. Ideally you want your blood pressure below 120/80. Hypertension forces your heart to work harder to pump blood. Your arteries may become narrow or stiff. Having untreated or uncontrolled hypertension can cause heart attack, stroke, kidney disease, and other problems. RISK FACTORS Some risk factors for high blood pressure are controllable. Others are not.  Risk factors you cannot control include:   Race. You may be at higher risk if you are African American.  Age. Risk increases with age.  Gender. Men are at higher risk than women before age 44 years. After age 32, women are at higher risk than men. Risk factors you can control include:  Not getting enough exercise or physical activity.  Being overweight.  Getting too much fat, sugar, calories, or salt in your diet.  Drinking too much alcohol. SIGNS AND SYMPTOMS Hypertension does not usually cause signs or symptoms. Extremely high blood pressure (hypertensive crisis) may cause headache, anxiety, shortness of breath, and nosebleed. DIAGNOSIS To check if you have hypertension, your health care provider will measure your blood pressure while you are seated, with  your arm held at the level of your heart. It should be measured at least twice using the same arm. Certain conditions can cause a difference in blood pressure between your right and left arms. A blood pressure reading that is higher than normal on one occasion does not mean that you need treatment. If it is not clear whether you have high blood pressure, you may be asked to return on a different day to have your blood pressure checked again. Or, you may be asked to monitor your blood pressure at home for 1 or more weeks. TREATMENT Treating high blood pressure includes making lifestyle changes and possibly taking medicine. Living a healthy lifestyle can help lower high blood pressure. You may need to change some of your habits. Lifestyle changes may include:  Following the DASH diet. This diet is high in fruits, vegetables, and whole grains. It is low in salt, red meat, and added sugars.  Keep your sodium intake below 2,300 mg per day.  Getting at least 30-45 minutes of aerobic exercise at least 4 times per week.  Losing weight if necessary.  Not smoking.  Limiting alcoholic beverages.  Learning ways to reduce stress. Your health care provider may prescribe medicine if lifestyle changes are not enough to get your blood pressure under control, and if one of the following is true:  You are 26-43 years of age and your systolic blood pressure is above 140.  You are 39 years of age or older, and your systolic blood pressure is above 150.  Your diastolic blood pressure is above 90.  You have diabetes, and your systolic blood pressure is over [id number] or your diastolic blood pressure is over 90.  You have kidney disease and your blood pressure is above 140/90.  You have heart disease and your blood pressure is above 140/90. Your personal target blood pressure may vary depending on your medical conditions, your age, and other factors. HOME CARE INSTRUCTIONS  Have your blood pressure rechecked as  directed by your health care provider.   Take medicines only as directed by your health care provider. Follow the directions carefully. Blood pressure medicines must be taken as prescribed. The medicine does not work as well when you skip doses. Skipping doses also puts you at risk for problems.  Do not smoke.   Monitor your blood pressure at home as directed by your health care provider. SEEK MEDICAL CARE IF:   You think you are having a reaction to medicines taken.  You have recurrent headaches or feel dizzy.  You have swelling in your ankles.  You have trouble with your vision. SEEK IMMEDIATE MEDICAL CARE IF:  You develop a severe headache or confusion.  You have unusual weakness, numbness, or feel faint.  You have severe chest or abdominal pain.  You vomit repeatedly.  You have trouble breathing. MAKE SURE YOU:   Understand these instructions.  Will watch your condition.  Will get help right away if you are not doing well or get worse.   This information is not intended to  replace advice given to you by your health care provider. Make sure you discuss any questions you have with your health care provider.   Document Released: 08/17/2005 Document Revised: 01/01/2015 Document Reviewed: 06/09/2013 Elsevier Interactive Patient Education [employer].

## 2015-09-11 ENCOUNTER — Emergency Department (HOSPITAL_COMMUNITY)
Admission: EM | Admit: 2015-09-11 | Discharge: 2015-09-11 | Disposition: A | Discharge status: Home / Self Care | Payer: Self-pay | Attending: Emergency Medicine | Admitting: Emergency Medicine

## 2015-09-11 ENCOUNTER — Encounter (HOSPITAL_COMMUNITY): Payer: Self-pay | Admitting: *Deleted

## 2015-09-11 DIAGNOSIS — H538 Other visual disturbances: Secondary | ICD-10-CM | POA: Insufficient documentation

## 2015-09-11 DIAGNOSIS — R51 Headache: Secondary | ICD-10-CM | POA: Insufficient documentation

## 2015-09-11 DIAGNOSIS — R030 Elevated blood-pressure reading, without diagnosis of hypertension: Secondary | ICD-10-CM

## 2015-09-11 DIAGNOSIS — Z79899 Other long term (current) drug therapy: Secondary | ICD-10-CM | POA: Insufficient documentation

## 2015-09-11 DIAGNOSIS — I10 Essential (primary) hypertension: Secondary | ICD-10-CM | POA: Insufficient documentation

## 2015-09-11 DIAGNOSIS — Z87891 Personal history of nicotine dependence: Secondary | ICD-10-CM | POA: Insufficient documentation

## 2015-09-11 DIAGNOSIS — IMO0001 Reserved for inherently not codable concepts without codable children: Secondary | ICD-10-CM

## 2015-09-11 LAB — I-STAT CHEM 8, ED
BUN: 26 mg/dL — ABNORMAL HIGH (ref 6–20)
CHLORIDE: 105 mmol/L (ref 101–111)
CREATININE: 2.2 mg/dL — AB (ref 0.44–1.00)
Calcium, Ion: 1.18 mmol/L (ref 1.12–1.23)
GLUCOSE: 123 mg/dL — AB (ref 65–99)
HCT: 43 % (ref 36.0–46.0)
Hemoglobin: 14.6 g/dL (ref 12.0–15.0)
Potassium: 3.7 mmol/L (ref 3.5–5.1)
Sodium: 142 mmol/L (ref 135–145)
TCO2: 23 mmol/L (ref 0–100)

## 2015-09-11 MED ORDER — AMLODIPINE BESYLATE 10 MG PO TABS: 10.0000 mg | ORAL_TABLET | Freq: Once | ORAL | Status: DC

## 2015-09-11 MED ORDER — AMLODIPINE BESYLATE 5 MG PO TABS
10.0000 mg | ORAL_TABLET | Freq: Once | ORAL | Status: AC
  Administered 2015-09-11: 10 mg via ORAL
  Filled 2015-09-11: qty 2

## 2015-09-11 NOTE — ED Notes (Signed)
Pt states she is unable to afford Norvasc until Friday.  Mariann Laster, Education officer, museum at bedside to talk with pt.

## 2015-09-11 NOTE — Discharge Instructions (Signed)
Hypertension Hypertension, commonly called high blood pressure, is when the force of blood pumping through your arteries is too strong. Your arteries are the blood vessels that carry blood from your heart throughout your body. A blood pressure reading consists of a higher number over a lower number, such as 110/72. The higher number (systolic) is the pressure inside your arteries when your heart pumps. The lower number (diastolic) is the pressure inside your arteries when your heart relaxes. Ideally you want your blood pressure below 120/80. Hypertension forces your heart to work harder to pump blood. Your arteries may become narrow or stiff. Having untreated or uncontrolled hypertension can cause heart attack, stroke, kidney disease, and other problems. RISK FACTORS Some risk factors for high blood pressure are controllable. Others are not.  Risk factors you cannot control include:   Race. You may be at higher risk if you are African American.  Age. Risk increases with age.  Gender. Men are at higher risk than women before age 45 years. After age 65, women are at higher risk than men. Risk factors you can control include:  Not getting enough exercise or physical activity.  Being overweight.  Getting too much fat, sugar, calories, or salt in your diet.  Drinking too much alcohol. SIGNS AND SYMPTOMS Hypertension does not usually cause signs or symptoms. Extremely high blood pressure (hypertensive crisis) may cause headache, anxiety, shortness of breath, and nosebleed. DIAGNOSIS To check if you have hypertension, your health care provider will measure your blood pressure while you are seated, with your arm held at the level of your heart. It should be measured at least twice using the same arm. Certain conditions can cause a difference in blood pressure between your right and left arms. A blood pressure reading that is higher than normal on one occasion does not mean that you need treatment. If  it is not clear whether you have high blood pressure, you may be asked to return on a different day to have your blood pressure checked again. Or, you may be asked to monitor your blood pressure at home for 1 or more weeks. TREATMENT Treating high blood pressure includes making lifestyle changes and possibly taking medicine. Living a healthy lifestyle can help lower high blood pressure. You may need to change some of your habits. Lifestyle changes may include:  Following the DASH diet. This diet is high in fruits, vegetables, and whole grains. It is low in salt, red meat, and added sugars.  Keep your sodium intake below 2,300 mg per day.  Getting at least 30-45 minutes of aerobic exercise at least 4 times per week.  Losing weight if necessary.  Not smoking.  Limiting alcoholic beverages.  Learning ways to reduce stress. Your health care provider may prescribe medicine if lifestyle changes are not enough to get your blood pressure under control, and if one of the following is true:  You are 18-59 years of age and your systolic blood pressure is above 140.  You are 60 years of age or older, and your systolic blood pressure is above 150.  Your diastolic blood pressure is above 90.  You have diabetes, and your systolic blood pressure is over 140 or your diastolic blood pressure is over 90.  You have kidney disease and your blood pressure is above 140/90.  You have heart disease and your blood pressure is above 140/90. Your personal target blood pressure may vary depending on your medical conditions, your age, and other factors. HOME CARE INSTRUCTIONS    Have your blood pressure rechecked as directed by your health care provider.   Take medicines only as directed by your health care provider. Follow the directions carefully. Blood pressure medicines must be taken as prescribed. The medicine does not work as well when you skip doses. Skipping doses also puts you at risk for  problems.  Do not smoke.   Monitor your blood pressure at home as directed by your health care provider. SEEK MEDICAL CARE IF:   You think you are having a reaction to medicines taken.  You have recurrent headaches or feel dizzy.  You have swelling in your ankles.  You have trouble with your vision. SEEK IMMEDIATE MEDICAL CARE IF:  You develop a severe headache or confusion.  You have unusual weakness, numbness, or feel faint.  You have severe chest or abdominal pain.  You vomit repeatedly.  You have trouble breathing. MAKE SURE YOU:   Understand these instructions.  Will watch your condition.  Will get help right away if you are not doing well or get worse.   This information is not intended to replace advice given to you by your health care provider. Make sure you discuss any questions you have with your health care provider.   Document Released: 08/17/2005 Document Revised: 01/01/2015 Document Reviewed: 06/09/2013 Elsevier Interactive Patient Education 2016 [employer].  How to Take Your Blood Pressure HOW DO I GET A BLOOD PRESSURE MACHINE?  You can buy an electronic home blood pressure machine at your local pharmacy. Insurance will sometimes cover the cost if you have a prescription.  Ask your doctor what type of machine is best for you. There are different machines for your arm and your wrist.  If you decide to buy a machine to check your blood pressure on your arm, first check the size of your arm so you can buy the right size cuff. To check the size of your arm:   Use a measuring tape that shows both inches and centimeters.   Wrap the measuring tape around the upper-middle part of your arm. You may need someone to help you measure.   Write down your arm measurement in both inches and centimeters.   To measure your blood pressure correctly, it is important to have the right size cuff.   If your arm is up to 13 inches (up to 34 centimeters), get an  adult cuff size.  If your arm is 13 to 17 inches (35 to 44 centimeters), get a large adult cuff size.    If your arm is 17 to 20 inches (45 to 52 centimeters), get an adult thigh cuff.  WHAT DO THE NUMBERS MEAN?   There are two numbers that make up your blood pressure. For example: 120/80.  The first number (120 in our example) is called the "systolic pressure." It is a measure of the pressure in your blood vessels when your heart is pumping blood.  The second number (80 in our example) is called the "diastolic pressure." It is a measure of the pressure in your blood vessels when your heart is resting between beats.  Your doctor will tell you what your blood pressure should be. WHAT SHOULD I DO BEFORE I CHECK MY BLOOD PRESSURE?   Try to rest or relax for at least 30 minutes before you check your blood pressure.  Do not smoke.  Do not have any drinks with caffeine, such as:  Soda.  Coffee.  Tea.  Check your blood pressure in a  quiet room.  Sit down and stretch out your arm on a table. Keep your arm at about the level of your heart. Let your arm relax.  Make sure that your legs are not crossed. HOW DO I CHECK MY BLOOD PRESSURE?  Follow the directions that came with your machine.  Make sure you remove any tight-fitting clothing from your arm or wrist. Wrap the cuff around your upper arm or wrist. You should be able to fit a finger between the cuff and your arm. If you cannot fit a finger between the cuff and your arm, it is too tight and should be removed and rewrapped.  Some units require you to manually pump up the arm cuff.  Automatic units inflate the cuff when you press a button.  Cuff deflation is automatic in both models.  After the cuff is inflated, the unit measures your blood pressure and pulse. The readings are shown on a monitor. Hold still and breathe normally while the cuff is inflated.  Getting a reading takes less than a minute.  Some models store  readings in a memory. Some provide a printout of readings. If your machine does not store your readings, keep a written record.  Take readings with you to your next visit with your doctor.   This information is not intended to replace advice given to you by your health care provider. Make sure you discuss any questions you have with your health care provider.   Document Released: 07/30/2008 Document Revised: 09/07/2014 Document Reviewed: 10/12/2013 Elsevier Interactive Patient Education 2016 [place] Your High Blood Pressure Blood pressure is a measurement of how forceful your blood is pressing against the walls of the arteries. Arteries are muscular tubes within the circulatory system. Blood pressure does not stay the same. Blood pressure rises when you are active, excited, or nervous; and it lowers during sleep and relaxation. If the numbers measuring your blood pressure stay above normal most of the time, you are at risk for health problems. High blood pressure (hypertension) is a long-term (chronic) condition in which blood pressure is elevated. A blood pressure reading is recorded as two numbers, such as 120 over 80 (or 120/80). The first, higher number is called the systolic pressure. It is a measure of the pressure in your arteries as the heart beats. The second, lower number is called the diastolic pressure. It is a measure of the pressure in your arteries as the heart relaxes between beats.  Keeping your blood pressure in a normal range is important to your overall health and prevention of health problems, such as heart disease and stroke. When your blood pressure is uncontrolled, your heart has to work harder than normal. High blood pressure is a very common condition in adults because blood pressure tends to rise with age. Men and women are equally likely to have hypertension but at different times in life. Before age 4, men are more likely to have hypertension. After 53 years  of age, women are more likely to have it. Hypertension is especially common in African Americans. This condition often has no signs or symptoms. The cause of the condition is usually not known. Your caregiver can help you come up with a plan to keep your blood pressure in a normal, healthy range. BLOOD PRESSURE STAGES Blood pressure is classified into four stages: normal, prehypertension, stage 1, and stage 2. Your blood pressure reading will be used to determine what type of treatment, if any, is necessary. Appropriate  treatment options are tied to these four stages:  Normal  Systolic pressure (mm Hg): below 120.  Diastolic pressure (mm Hg): below 80. Prehypertension  Systolic pressure (mm Hg): 120 to 139.  Diastolic pressure (mm Hg): 80 to 89. Stage1  Systolic pressure (mm Hg): 140 to 159.  Diastolic pressure (mm Hg): 90 to 99. Stage2  Systolic pressure (mm Hg): 160 or above.  Diastolic pressure (mm Hg): 100 or above. RISKS RELATED TO HIGH BLOOD PRESSURE Managing your blood pressure is an important responsibility. Uncontrolled high blood pressure can lead to:  A heart attack.  A stroke.  A weakened blood vessel (aneurysm).  Heart failure.  Kidney damage.  Eye damage.  Metabolic syndrome.  Memory and concentration problems. HOW TO MANAGE YOUR BLOOD PRESSURE Blood pressure can be managed effectively with lifestyle changes and medicines (if needed). Your caregiver will help you come up with a plan to bring your blood pressure within a normal range. Your plan should include the following: Education  Read all information provided by your caregivers about how to control blood pressure.  Educate yourself on the latest guidelines and treatment recommendations. New research is always being done to further define the risks and treatments for high blood pressure. Lifestylechanges  Control your weight.  Avoid smoking.  Stay physically active.  Reduce the amount of  salt in your diet.  Reduce stress.  Control any chronic conditions, such as high cholesterol or diabetes.  Reduce your alcohol intake. Medicines  Several medicines (antihypertensive medicines) are available, if needed, to bring blood pressure within a normal range. Communication  Review all the medicines you take with your caregiver because there may be side effects or interactions.  Talk with your caregiver about your diet, exercise habits, and other lifestyle factors that may be contributing to high blood pressure.  See your caregiver regularly. Your caregiver can help you create and adjust your plan for managing high blood pressure. RECOMMENDATIONS FOR TREATMENT AND FOLLOW-UP  The following recommendations are based on current guidelines for managing high blood pressure in nonpregnant adults. Use these recommendations to identify the proper follow-up period or treatment option based on your blood pressure reading. You can discuss these options with your caregiver.  Systolic pressure of 123456 to [id number] or diastolic pressure of 80 to 89: Follow up with your caregiver as directed.  Systolic pressure of XX123456 to 0000000 or diastolic pressure of 90 to 100: Follow up with your caregiver within 2 months.  Systolic pressure above 0000000 or diastolic pressure above 123XX123: Follow up with your caregiver within 1 month.  Systolic pressure above 99991111 or diastolic pressure above A999333: Consider antihypertensive therapy; follow up with your caregiver within 1 week.  Systolic pressure above A999333 or diastolic pressure above 123456: Begin antihypertensive therapy; follow up with your caregiver within 1 week.   This information is not intended to replace advice given to you by your health care provider. Make sure you discuss any questions you have with your health care provider.   Document Released: 05/11/2012 Document Reviewed: 05/11/2012 Elsevier Interactive Patient Education [employer].

## 2015-09-11 NOTE — Care Management (Signed)
CM met with patient at bedside concerning medication assistance. Patient reports not being able to afford Norvasc prescription of $18. CM discussed goodrx coupons. $8.00 patient  still states, she is unable to afford until next week. This patient is active with the [facility], [place] faxed the prescription to the [facility] pharmacy, will contact the Pharmacy staff by e-mail to assist patient with prescription until she gets paid. Updated Santiago Glad RN on [place] [facility]. No further ED CM needs identified.

## 2015-09-11 NOTE — ED Provider Notes (Signed)
CSN: [id number]     Arrival date & time 09/11/15  1245 History   First MD Initiated Contact with Patient 09/11/15 1628     Chief Complaint  Patient presents with  . Hypertension     (Consider location/radiation/quality/duration/timing/severity/associated sxs/prior Treatment) Patient is a 55 y.o. female presenting with hypertension. The history is provided by the patient.  Hypertension This is a recurrent problem. The current episode started in the past 7 days. The problem occurs constantly. The problem has been unchanged. Associated symptoms include headaches and a visual change. Pertinent negatives include no abdominal pain, anorexia, arthralgias, change in bowel habit, chest pain, chills, congestion, coughing, diaphoresis, fatigue, fever, joint swelling, myalgias, nausea, neck pain, numbness, rash, sore throat, swollen glands, urinary symptoms, vertigo, vomiting or weakness. Nothing aggravates the symptoms. She has tried nothing for the symptoms. The treatment provided no relief.    Past Medical History  Diagnosis Date  . Seasonal allergies   . Hypertension Dx March 2015   Past Surgical History  Procedure Laterality Date  . Tubal ligation    . Tubal ligation     Family History  Problem Relation Age of Onset  . Diabetes Mother   . Multiple sclerosis Daughter 42   Social History  Substance Use Topics  . Smoking status: Former Smoker -- 0.50 packs/day for 20 years    Types: Cigarettes    Quit date: 08/31/2001  . Smokeless tobacco: Never Used  . Alcohol Use: No   OB History    No data available     Review of Systems  Constitutional: Negative for fever, chills, diaphoresis and fatigue.  HENT: Negative for congestion and sore throat.   Eyes: Positive for visual disturbance. Negative for pain.  Respiratory: Negative for cough and chest tightness.   Cardiovascular: Negative for chest pain.  Gastrointestinal: Negative for nausea, vomiting, abdominal pain, anorexia and change  in bowel habit.  Genitourinary: Negative for dysuria.  Musculoskeletal: Negative for myalgias, joint swelling, arthralgias and neck pain.  Skin: Negative for rash.  Neurological: Positive for headaches. Negative for vertigo, weakness and numbness.  Psychiatric/Behavioral: Negative for confusion.      Allergies  Review of patient's allergies indicates no known allergies.  Home Medications   Prior to Admission medications   Medication Sig Start Date End Date Taking? Authorizing Provider  cloNIDine (CATAPRES) 0.1 MG tablet Take 1 tablet (0.1 mg total) by mouth 3 (three) times daily. 03/19/15  Yes Josalyn Funches, MD  Phenyleph-CPM-DM-APAP (ALKA-SELTZER PLUS COLD & COUGH) 12-30-08-325 MG CAPS Take 2 capsules by mouth daily as needed (for cold).   Yes Historical Provider, MD  acetaminophen (TYLENOL) 500 MG tablet Take 1 tablet (500 mg total) by mouth every 6 (six) hours as needed. 06/25/15   Waynetta Pean, PA-C  amLODipine (NORVASC) 10 MG tablet Take 1 tablet (10 mg total) by mouth once. 09/11/15   Esaw Grandchild, MD  penicillin v potassium (VEETID) 500 MG tablet Take 1 tablet (500 mg total) by mouth 4 (four) times daily. 06/25/15   Waynetta Pean, PA-C   BP 147/88 mmHg  Pulse 68  Temp(Src) 98.3 F (36.8 C) (Oral)  Resp 16  SpO2 98% Physical Exam  Constitutional: She is oriented to person, place, and time. She appears well-developed and well-nourished. No distress.  HENT:  Head: Normocephalic and atraumatic.  Eyes: Conjunctivae and EOM are normal. Pupils are equal, round, and reactive to light.  Neck: Normal range of motion.  Cardiovascular: Normal rate and regular rhythm.  Exam reveals no  gallop and no friction rub.   No murmur heard. Pulmonary/Chest: Effort normal and breath sounds normal. No respiratory distress. She has no wheezes. She has no rales. She exhibits no tenderness.  Abdominal: Soft. Bowel sounds are normal. She exhibits no distension and no mass. There is no tenderness.  There is no rebound and no guarding.  Neurological: She is alert and oriented to person, place, and time. No cranial nerve deficit. Coordination normal.  Skin: Skin is warm and dry. No rash noted. She is not diaphoretic. No erythema. No pallor.  Psychiatric: She has a normal mood and affect. Her behavior is normal.    ED Course  Procedures (including critical care time) Labs Review Labs Reviewed  I-STAT CHEM 8, ED - Abnormal; Notable for the following:    BUN 26 (*)    Creatinine, Ser 2.20 (*)    Glucose, Bld 123 (*)    All other components within normal limits    Imaging Review No results found. I have personally reviewed and evaluated these images and lab results as part of my medical decision-making.   EKG Interpretation   Date/Time:  Wednesday September 11 2015 17:22:10 EST Ventricular Rate:  61 PR Interval:  154 QRS Duration: 91 QT Interval:  473 QTC Calculation: 476 R Axis:   76 Text Interpretation:  Sinus rhythm Atrial premature complex Left  ventricular hypertrophy Anterior Q waves, possibly due to LVH No  significant change since last tracing Confirmed by KNAPP  MD-J, JON  [id number]) on 09/11/2015 5:25:30 PM      MDM   Final diagnoses:  Elevated blood pressure    55 year old black female past medical history of hypertension presents in setting of elevated blood pressure. Patient reports a last 4 days she is "not felt well". She reports during this time she is check her blood pressure home and noticed in the 123456 to A999333 systolic. She reports due to continued high blood pressure she presented to emergency department today. She reports that time she's had a mild headache and some mild blurred vision. She reports no change in visual acuity and on check she has no visual acuity difference from baseline per patient. Patient denies any chest pain, shortness of breath, bowel pain, nausea, vomiting, dysuria. She denies taking medicines today.  Laboratory analysis obtained  which showed a creatinine 2.2 which is not really different from patient's baseline. No significant electrolyte abnormality's noted. Patient without chest pain and EKG revealed normal sinus rhythm without signs of acute ischemia. Patient was neurovascularly intact on examination and do not believe CT head is indicated at this time. This is likely high blood pressure in setting of known hypertension. We'll discharge home at this time with plan to follow up PCP in one to 2 days for reevaluation. Patient advised to increase home amlodipine from 5 mg to 10 mg daily. Patient given dose of amlodipine and emergency department she did not take her blood pressure medicines today. Patient stable at time of discharge and given strict return precautions. Patient in agreement with plan.  Attending has seen and evaluated patient and Dr. Tomi Bamberger is in agreement with plan.    Esaw Grandchild, MD 09/11/15 1830  Dorie Rank, MD 09/11/15 [phone]

## 2015-09-11 NOTE — ED Notes (Signed)
Social worker spoke with pt and referred her to [facility] tomorrow for medication.

## 2015-09-11 NOTE — ED Notes (Addendum)
Pt reports hypertension x4 days. Pt states that she has been taking her medications as prescribed. Pt denies pain. Pt reports generally not feeling well and blurred vision as well.

## 2015-09-12 MED FILL — AMLODIPINE BESYLATE 10 MG T: 10 | 30 days supply | Qty: 30 | Fill #0

## 2015-09-18 ENCOUNTER — Telehealth: Payer: Self-pay

## 2015-09-18 NOTE — Telephone Encounter (Signed)
Nurse received fax from [facility]. Patient called [facility] on 09/10/15 at 7:57pm. Per Craig note: "Caller states her blood pressure is currently 159/109. Wants to know if she needs to go to ER." and "caller states having high BP, BP 150/105, tored". [facility] Nurse reviewed patients chart. Patient was seen in ED for high BP on 09/11/15. Patient has appointment with Dr. Adrian Blackwater on 09/30/15 at 4:45pm. Nurse called to check on patient, reached voicemail. Voicemail left for patient to return call to [facility] with [facility] at [phone].

## 2015-09-18 NOTE — Telephone Encounter (Signed)
Patient returning nurses call, patient verified date of birth. Nurse called patient to see how patient was feeling and how blood pressure was doing. Per patient feeling better, they increased amlodipine another 5mg  to equal 10mg .  ED doctor was explaining to patient, importance of getting off of clonidine.  Patient explains she will discuss this at appointment on 09/30/15.

## 2015-09-30 ENCOUNTER — Ambulatory Visit: Payer: Self-pay | Admitting: Family Medicine

## 2015-10-24 MED FILL — ?CLONIDINE HCL 0.1 MG TABLE: 0.1 MG | 3 days supply | Qty: 8 | Fill #0

## 2015-10-24 MED FILL — ?AMLODIPINE BESYLATE 5 MG T: 5 | 30 days supply | Qty: 30 | Fill #0

## 2015-10-25 ENCOUNTER — Other Ambulatory Visit: Payer: Self-pay | Admitting: Family Medicine

## 2015-10-25 MED FILL — cloNIDine HCL 0.1 MG TABS: 0.1 | 30 days supply | Qty: 90 | Fill #0

## 2015-11-28 ENCOUNTER — Other Ambulatory Visit: Payer: Self-pay | Admitting: Internal Medicine

## 2015-11-28 MED FILL — ?AMLODIPINE BESYLATE 5 MG T: 5 | 30 days supply | Qty: 30 | Fill #1

## 2015-11-28 MED FILL — ?CLONIDINE HCL 0.1 MG TABLE: 0.1 MG | 30 days supply | Qty: 90 | Fill #0

## 2016-01-03 ENCOUNTER — Other Ambulatory Visit: Payer: Self-pay | Admitting: Family Medicine

## 2016-01-05 ENCOUNTER — Encounter (HOSPITAL_COMMUNITY): Payer: Self-pay | Admitting: *Deleted

## 2016-01-05 ENCOUNTER — Encounter (HOSPITAL_COMMUNITY): Payer: Self-pay | Admitting: Family Medicine

## 2016-01-05 ENCOUNTER — Ambulatory Visit (HOSPITAL_COMMUNITY)
Admission: EM | Admit: 2016-01-05 | Discharge: 2016-01-05 | Disposition: A | Discharge status: Nursing Home (Includes ICF) | Payer: Self-pay | Attending: Family Medicine | Admitting: Family Medicine

## 2016-01-05 ENCOUNTER — Emergency Department (HOSPITAL_COMMUNITY)
Admission: EM | Admit: 2016-01-05 | Discharge: 2016-01-05 | Disposition: A | Discharge status: Home / Self Care | Payer: Self-pay | Attending: Emergency Medicine | Admitting: Emergency Medicine

## 2016-01-05 DIAGNOSIS — Z87448 Personal history of other diseases of urinary system: Secondary | ICD-10-CM | POA: Insufficient documentation

## 2016-01-05 DIAGNOSIS — I160 Hypertensive urgency: Secondary | ICD-10-CM

## 2016-01-05 DIAGNOSIS — Z79899 Other long term (current) drug therapy: Secondary | ICD-10-CM | POA: Insufficient documentation

## 2016-01-05 DIAGNOSIS — I10 Essential (primary) hypertension: Secondary | ICD-10-CM | POA: Insufficient documentation

## 2016-01-05 DIAGNOSIS — Z792 Long term (current) use of antibiotics: Secondary | ICD-10-CM | POA: Insufficient documentation

## 2016-01-05 DIAGNOSIS — Z87891 Personal history of nicotine dependence: Secondary | ICD-10-CM | POA: Insufficient documentation

## 2016-01-05 HISTORY — DX: Disorder of kidney and ureter, unspecified: N28.9

## 2016-01-05 LAB — POCT I-STAT, CHEM 8
BUN: 33 mg/dL — AB (ref 6–20)
CALCIUM ION: 1.17 mmol/L (ref 1.12–1.23)
Chloride: 106 mmol/L (ref 101–111)
Creatinine, Ser: 2.4 mg/dL — ABNORMAL HIGH (ref 0.44–1.00)
Glucose, Bld: 99 mg/dL (ref 65–99)
HCT: 48 % — ABNORMAL HIGH (ref 36.0–46.0)
Hemoglobin: 16.3 g/dL — ABNORMAL HIGH (ref 12.0–15.0)
Potassium: 4.2 mmol/L (ref 3.5–5.1)
SODIUM: 141 mmol/L (ref 135–145)
TCO2: 25 mmol/L (ref 0–100)

## 2016-01-05 MED ORDER — AMLODIPINE BESYLATE 10 MG PO TABS: 10.0000 mg | ORAL_TABLET | Freq: Every day | ORAL | Status: DC

## 2016-01-05 MED ORDER — CLONIDINE HCL 0.1 MG PO TABS: ORAL_TABLET | ORAL | Status: DC

## 2016-01-05 MED ORDER — CLONIDINE HCL 0.1 MG PO TABS
ORAL_TABLET | ORAL | Status: AC
  Filled 2016-01-05: qty 1

## 2016-01-05 MED ORDER — CLONIDINE HCL 0.1 MG PO TABS
0.1000 mg | ORAL_TABLET | Freq: Once | ORAL | Status: AC
  Administered 2016-01-05: 0.1 mg via ORAL

## 2016-01-05 NOTE — Discharge Instructions (Signed)
Go immediately to ED for further evaluation.

## 2016-01-05 NOTE — ED Provider Notes (Signed)
CSN: [id number]     Arrival date & time 01/05/16  1603 History   First MD Initiated Contact with Patient 01/05/16 1940     Chief Complaint  Patient presents with  . Hypertension     (Consider location/radiation/quality/duration/timing/severity/associated sxs/prior Treatment) Patient is a 55 y.o. female presenting with hypertension.  Hypertension   Patient was seen at urgent care earlier today. She ran out of her blood pressure medicine 2 days ago and requests a refill. She felt as if her blood pressure up earlier today. She complained of a "strange feeling in my head" she is asymptomatic presentlly. Denies headache denies chest pain denies visual changes no other complaint. Past Medical History  Diagnosis Date  . Seasonal allergies   . Hypertension Dx March 2015  . Renal disorder     R/T HTN  . Kidney disease    Past Surgical History  Procedure Laterality Date  . Tubal ligation     Family History  Problem Relation Age of Onset  . Diabetes Mother   . Multiple sclerosis Daughter 75   Social History  Substance Use Topics  . Smoking status: Former Smoker -- 0.50 packs/day for 20 years    Types: Cigarettes    Quit date: 08/31/2001  . Smokeless tobacco: Never Used  . Alcohol Use: No  Positive marijuana use OB History    No data available     Review of Systems  Constitutional: Negative.   HENT: Negative.   Respiratory: Negative.   Cardiovascular: Negative.   Gastrointestinal: Negative.   Musculoskeletal: Negative.   Skin: Negative.   Neurological: Negative.   Psychiatric/Behavioral: Negative.   All other systems reviewed and are negative.     Allergies  Review of patient's allergies indicates no known allergies.  Home Medications   Prior to Admission medications   Medication Sig Start Date End Date Taking? Authorizing Provider  acetaminophen (TYLENOL) 500 MG tablet Take 1 tablet (500 mg total) by mouth every 6 (six) hours as needed. 06/25/15   Waynetta Pean,  PA-C  amLODipine (NORVASC) 10 MG tablet Take 1 tablet (10 mg total) by mouth once. 09/11/15   Esaw Grandchild, MD  cloNIDine (CATAPRES) 0.1 MG tablet TAKE 1 TABLET BY MOUTH 3 TIMES DAILY. MUST HAVE OFFICE VISIT FOR MORE REFILLS 11/28/15   Boykin Nearing, MD  penicillin v potassium (VEETID) 500 MG tablet Take 1 tablet (500 mg total) by mouth 4 (four) times daily. 06/25/15   Waynetta Pean, PA-C  Phenyleph-CPM-DM-APAP (ALKA-SELTZER PLUS COLD & COUGH) 12-30-08-325 MG CAPS Take 2 capsules by mouth daily as needed (for cold).    Historical Provider, MD   BP 145/89 mmHg  Pulse 73  Temp(Src) 98 F (36.7 C) (Oral)  Resp 18  SpO2 99% Physical Exam  Constitutional: She appears well-developed and well-nourished.  HENT:  Head: Normocephalic and atraumatic.  Eyes: Conjunctivae are normal. Pupils are equal, round, and reactive to light.  Neck: Neck supple. No tracheal deviation present. No thyromegaly present.  Cardiovascular: Normal rate and regular rhythm.   No murmur heard. Pulmonary/Chest: Effort normal and breath sounds normal.  Abdominal: Soft. Bowel sounds are normal. She exhibits no distension. There is no tenderness.  Musculoskeletal: Normal range of motion. She exhibits no edema or tenderness.  Neurological: She is alert. Coordination normal.  Skin: Skin is warm and dry. No rash noted.  Psychiatric: She has a normal mood and affect.  Nursing note and vitals reviewed.   ED Course  Procedures (including critical care time) Labs Review  Labs Reviewed - No data to display  Imaging Review No results found. I have personally reviewed and evaluated these images and lab results as part of my medical decision-making.   EKG Interpretation None     Results for orders placed or performed during the hospital encounter of 01/05/16  I-STAT, chem 8  Result Value Ref Range   Sodium 141 135 - 145 mmol/L   Potassium 4.2 3.5 - 5.1 mmol/L   Chloride 106 101 - 111 mmol/L   BUN 33 (H) 6 - 20 mg/dL    Creatinine, Ser 2.40 (H) 0.44 - 1.00 mg/dL   Glucose, Bld 99 65 - 99 mg/dL   Calcium, Ion 1.17 1.12 - 1.23 mmol/L   TCO2 25 0 - 100 mmol/L   Hemoglobin 16.3 (H) 12.0 - 15.0 g/dL   HCT 48.0 (H) 36.0 - 46.0 %   No results found.  MDM  Renal insufficiency is chronic. Creatinine slightly worsened 3 months ago. Plan prescriptions clonidine, Norvasc. She is advised to call [facility] for next available appointment Diagnoses #1 hypertension #2 medication noncompliance #3 chronic renal insufficiency Final diagnoses:  None        Orlie Dakin, MD 01/05/16 2029

## 2016-01-05 NOTE — ED Notes (Signed)
Reports last dose of clonidine and amlodipine yesterday AM.  Has tried calling for appt with PCP without response.

## 2016-01-05 NOTE — Discharge Instructions (Signed)
Hypertension Take your blood pressure medication as prescribed. Call the [facility] tomorrow to schedule the next available appointment. Hypertension, commonly called high blood pressure, is when the force of blood pumping through your arteries is too strong. Your arteries are the blood vessels that carry blood from your heart throughout your body. A blood pressure reading consists of a higher number over a lower number, such as 110/72. The higher number (systolic) is the pressure inside your arteries when your heart pumps. The lower number (diastolic) is the pressure inside your arteries when your heart relaxes. Ideally you want your blood pressure below 120/80. Hypertension forces your heart to work harder to pump blood. Your arteries may become narrow or stiff. Having untreated or uncontrolled hypertension can cause heart attack, stroke, kidney disease, and other problems. RISK FACTORS Some risk factors for high blood pressure are controllable. Others are not.  Risk factors you cannot control include:   Race. You may be at higher risk if you are African American.  Age. Risk increases with age.  Gender. Men are at higher risk than women before age 13 years. After age 23, women are at higher risk than men. Risk factors you can control include:  Not getting enough exercise or physical activity.  Being overweight.  Getting too much fat, sugar, calories, or salt in your diet.  Drinking too much alcohol. SIGNS AND SYMPTOMS Hypertension does not usually cause signs or symptoms. Extremely high blood pressure (hypertensive crisis) may cause headache, anxiety, shortness of breath, and nosebleed. DIAGNOSIS To check if you have hypertension, your health care provider will measure your blood pressure while you are seated, with your arm held at the level of your heart. It should be measured at least twice using the same arm. Certain conditions can cause a difference in  blood pressure between your right and left arms. A blood pressure reading that is higher than normal on one occasion does not mean that you need treatment. If it is not clear whether you have high blood pressure, you may be asked to return on a different day to have your blood pressure checked again. Or, you may be asked to monitor your blood pressure at home for 1 or more weeks. TREATMENT Treating high blood pressure includes making lifestyle changes and possibly taking medicine. Living a healthy lifestyle can help lower high blood pressure. You may need to change some of your habits. Lifestyle changes may include:  Following the DASH diet. This diet is high in fruits, vegetables, and whole grains. It is low in salt, red meat, and added sugars.  Keep your sodium intake below 2,300 mg per day.  Getting at least 30-45 minutes of aerobic exercise at least 4 times per week.  Losing weight if necessary.  Not smoking.  Limiting alcoholic beverages.  Learning ways to reduce stress. Your health care provider may prescribe medicine if lifestyle changes are not enough to get your blood pressure under control, and if one of the following is true:  You are 38-22 years of age and your systolic blood pressure is above 140.  You are 32 years of age or older, and your systolic blood pressure is above 150.  Your diastolic blood pressure is above 90.  You have diabetes, and your systolic blood pressure is over [id number] or your diastolic blood pressure is over 90.  You have kidney disease and your blood pressure is above 140/90.  You have heart disease and your blood pressure is  above 140/90. Your personal target blood pressure may vary depending on your medical conditions, your age, and other factors. HOME CARE INSTRUCTIONS  Have your blood pressure rechecked as directed by your health care provider.   Take medicines only as directed by your health care provider. Follow the directions carefully. Blood  pressure medicines must be taken as prescribed. The medicine does not work as well when you skip doses. Skipping doses also puts you at risk for problems.  Do not smoke.   Monitor your blood pressure at home as directed by your health care provider. SEEK MEDICAL CARE IF:   You think you are having a reaction to medicines taken.  You have recurrent headaches or feel dizzy.  You have swelling in your ankles.  You have trouble with your vision. SEEK IMMEDIATE MEDICAL CARE IF:  You develop a severe headache or confusion.  You have unusual weakness, numbness, or feel faint.  You have severe chest or abdominal pain.  You vomit repeatedly.  You have trouble breathing. MAKE SURE YOU:   Understand these instructions.  Will watch your condition.  Will get help right away if you are not doing well or get worse.   This information is not intended to replace advice given to you by your health care provider. Make sure you discuss any questions you have with your health care provider.   Document Released: 08/17/2005 Document Revised: 01/01/2015 Document Reviewed: 06/09/2013 Elsevier Interactive Patient Education [employer].

## 2016-01-05 NOTE — ED Notes (Signed)
Report called to Sharyn Lull, ED First Nurse.

## 2016-01-05 NOTE — ED Provider Notes (Addendum)
CSN: [id number]     Arrival date & time 01/05/16  1308 History   First MD Initiated Contact with Patient 01/05/16 1428     Chief Complaint  Patient presents with  . Medication Refill   (Consider location/radiation/quality/duration/timing/severity/associated sxs/prior Treatment) Patient is a 55 y.o. female presenting with hypertension. The history is provided by the patient.  Hypertension This is a chronic problem. The current episode started 2 days ago (out of bp meds for 2 days as unable to get office on phone.for refill.). The problem has been gradually worsening. Pertinent negatives include no chest pain and no shortness of breath.    Past Medical History  Diagnosis Date  . Seasonal allergies   . Hypertension Dx March 2015  . Renal disorder     R/T HTN  . Kidney disease    Past Surgical History  Procedure Laterality Date  . Tubal ligation     Family History  Problem Relation Age of Onset  . Diabetes Mother   . Multiple sclerosis Daughter 33   Social History  Substance Use Topics  . Smoking status: Former Smoker -- 0.50 packs/day for 20 years    Types: Cigarettes    Quit date: 08/31/2001  . Smokeless tobacco: Never Used  . Alcohol Use: No   OB History    No data available     Review of Systems  Constitutional: Negative.   Respiratory: Negative.  Negative for cough and shortness of breath.   Cardiovascular: Negative.  Negative for chest pain, palpitations and leg swelling.  All other systems reviewed and are negative.   Allergies  Review of patient's allergies indicates no known allergies.  Home Medications   Prior to Admission medications   Medication Sig Start Date End Date Taking? Authorizing Provider  acetaminophen (TYLENOL) 500 MG tablet Take 1 tablet (500 mg total) by mouth every 6 (six) hours as needed. Patient taking differently: Take 500 mg by mouth daily as needed for headache.  06/25/15   Waynetta Pean, PA-C  amLODipine (NORVASC) 10 MG tablet Take  1 tablet (10 mg total) by mouth daily. 01/05/16   Orlie Dakin, MD  aspirin 325 MG tablet Take 325 mg by mouth daily as needed for mild pain or headache.    Historical Provider, MD  cloNIDine (CATAPRES) 0.1 MG tablet 1 tablet 3 times a day 01/05/16   Orlie Dakin, MD   Meds Ordered and Administered this Visit   Medications  cloNIDine (CATAPRES) tablet 0.1 mg (0.1 mg Oral Given 01/05/16 1439)    BP 164/108 mmHg  Pulse 66  Temp(Src) 98.2 F (36.8 C) (Oral)  Resp 18  SpO2 99% No data found.   Physical Exam  Constitutional: She is oriented to person, place, and time. She appears well-developed and well-nourished. No distress.  Neck: Normal range of motion. Neck supple.  Cardiovascular: Normal rate, regular rhythm, normal heart sounds and intact distal pulses.   Pulmonary/Chest: Effort normal and breath sounds normal.  Musculoskeletal: She exhibits no edema.  Lymphadenopathy:    She has no cervical adenopathy.  Neurological: She is alert and oriented to person, place, and time.  Skin: Skin is warm and dry.  Nursing note and vitals reviewed.   ED Course  Procedures (including critical care time)  Labs Review Labs Reviewed  POCT I-STAT, CHEM 8 - Abnormal; Notable for the following:    BUN 33 (*)    Creatinine, Ser 2.40 (*)    Hemoglobin 16.3 (*)    HCT 48.0 (*)  All other components within normal limits    Imaging Review No results found.   Visual Acuity Review  Right Eye Distance:   Left Eye Distance:   Bilateral Distance:    Right Eye Near:   Left Eye Near:    Bilateral Near:         MDM   1. Asymptomatic hypertensive urgency     Sent for eval of severe hbp in setting of worsening renal insuff, min response to clonidine 0.1mg .  Billy Fischer, MD 01/05/16 [place], MD 01/05/16 2034  Billy Fischer, MD 01/05/16 2037

## 2016-01-05 NOTE — ED Notes (Signed)
Pt sent here for increased BP and kidney issues. sts she was given BP med at [facility] due to systolic over [id number]. Denies chest pain, SOB. sts she feels better since BP has decreased.

## 2016-02-03 ENCOUNTER — Encounter: Payer: Self-pay | Admitting: Family Medicine

## 2016-02-03 ENCOUNTER — Ambulatory Visit: Payer: Self-pay | Attending: Family Medicine | Admitting: Family Medicine

## 2016-02-03 VITALS — BP 115/79 | HR 83 | Temp 97.9°F | Resp 16 | Ht 63.5 in | Wt 124.0 lb

## 2016-02-03 DIAGNOSIS — M79671 Pain in right foot: Secondary | ICD-10-CM | POA: Insufficient documentation

## 2016-02-03 DIAGNOSIS — Z87891 Personal history of nicotine dependence: Secondary | ICD-10-CM | POA: Insufficient documentation

## 2016-02-03 DIAGNOSIS — Z79899 Other long term (current) drug therapy: Secondary | ICD-10-CM | POA: Insufficient documentation

## 2016-02-03 DIAGNOSIS — Z1231 Encounter for screening mammogram for malignant neoplasm of breast: Secondary | ICD-10-CM

## 2016-02-03 DIAGNOSIS — K140 Glossitis: Secondary | ICD-10-CM | POA: Insufficient documentation

## 2016-02-03 DIAGNOSIS — I129 Hypertensive chronic kidney disease with stage 1 through stage 4 chronic kidney disease, or unspecified chronic kidney disease: Secondary | ICD-10-CM | POA: Insufficient documentation

## 2016-02-03 DIAGNOSIS — I150 Renovascular hypertension: Secondary | ICD-10-CM

## 2016-02-03 DIAGNOSIS — Z7982 Long term (current) use of aspirin: Secondary | ICD-10-CM | POA: Insufficient documentation

## 2016-02-03 DIAGNOSIS — K089 Disorder of teeth and supporting structures, unspecified: Secondary | ICD-10-CM

## 2016-02-03 DIAGNOSIS — N184 Chronic kidney disease, stage 4 (severe): Secondary | ICD-10-CM | POA: Insufficient documentation

## 2016-02-03 MED ORDER — CLONIDINE HCL 0.1 MG PO TABS: ORAL_TABLET | ORAL | Status: DC

## 2016-02-03 MED ORDER — CHLORTHALIDONE 25 MG PO TABS: 25.0000 mg | ORAL_TABLET | Freq: Every day | ORAL | Status: DC

## 2016-02-03 MED ORDER — CHLORHEXIDINE GLUCONATE 0.12 % MT SOLN: 15.0000 mL | Freq: Two times a day (BID) | OROMUCOSAL | Status: DC

## 2016-02-03 MED ORDER — AMLODIPINE BESYLATE 10 MG PO TABS: 10.0000 mg | ORAL_TABLET | Freq: Every day | ORAL | Status: DC

## 2016-02-03 MED ORDER — DICLOFENAC SODIUM 3 % TD GEL: 1.0000 "application " | TRANSDERMAL | Status: DC | PRN

## 2016-02-03 MED FILL — CHLORTHALIDONE 25 MG TABLET: 25 | 30 days supply | Qty: 30 | Fill #0

## 2016-02-03 MED FILL — ?AMLODIPINE BESYLATE 10 MG: 10 | 30 days supply | Qty: 30 | Fill #0

## 2016-02-03 MED FILL — ?CLONIDINE HCL 0.1 MG TABL: 0.1 | 30 days supply | Qty: 90 | Fill #0

## 2016-02-03 MED FILL — CHLORHEXIDINE 0.12% RINSE: 0.12 | 15 days supply | Qty: 473 | Fill #0

## 2016-02-03 NOTE — Progress Notes (Signed)
F/U HTN  C/C Knot of rt foot pain with waling  Sore on tongue  Taking medication as prescribed  No tobacco user  No suicidal thoughts in the past two weeks  No pain

## 2016-02-03 NOTE — Patient Instructions (Addendum)
Rebecca Marquez was seen today for hypertension.  Diagnoses and all orders for this visit:  Renovascular hypertension -     amLODipine (NORVASC) 10 MG tablet; Take 1 tablet (10 mg total) by mouth daily. -     cloNIDine (CATAPRES) 0.1 MG tablet; 1 tablet 3 times a day -     chlorthalidone (HYGROTON) 25 MG tablet; Take 1 tablet (25 mg total) by mouth daily.  Glossitis -     chlorhexidine (PERIDEX) 0.12 % solution; Use as directed 15 mLs in the mouth or throat 2 (two) times daily. For 4 days  Right foot pain -     Diclofenac Sodium 3 % GEL; Place 1 application onto the skin as needed.   Be sure to apply for the [facility] discount and orange card- ask about the applications at the front desk.   F/u in 2 weeks with pharmacy for BP check, if BP well controlled change clonidine from 3 to 2 times daily  F/u with me in 4 weeks for HTN  Dr. Adrian Blackwater

## 2016-02-03 NOTE — Progress Notes (Signed)
Subjective:  Patient ID: Rebecca Marquez, female    DOB: [birth year]  Age: 55 y.o. MRN: [id number]  CC: Hypertension   HPI Rebecca Marquez presents for    1. CHRONIC HYPERTENSION since 2013. Has bilateral renal cortical scarring noted on CT abdomen and pelvis in 03/2014.   Disease Monitoring  Blood pressure range: not checking   Chest pain: no   Dyspnea: no   Claudication: no   Medication compliance: yes, she ran out of her medications last month and had elevated BP. She went to urgent care and a received a refill for one month. We have tried losartan in the past but she did not tolerate it due to ringing in ears.  Medication Side Effects  Lightheadedness: no   Urinary frequency: no   Edema: no     2. Painful tongue: this is chronic. She is uninsured and did not go to the dentist as instructed. She has very poor dentition with loose molar on R lower. She denies tongue ulcer. She is a non smoker and rarely drinks ETOH.   3. R foot pain: lateral mid foot  pain with swelling. No injury. No redness. X 4-6 weeks. Worse with walking.   Social History  Substance Use Topics  . Smoking status: Former Smoker -- 0.50 packs/day for 20 years    Types: Cigarettes    Quit date: 08/31/2001  . Smokeless tobacco: Never Used  . Alcohol Use: No    Outpatient Prescriptions Prior to Visit  Medication Sig Dispense Refill  . acetaminophen (TYLENOL) 500 MG tablet Take 1 tablet (500 mg total) by mouth every 6 (six) hours as needed. (Patient taking differently: Take 500 mg by mouth daily as needed for headache. ) 30 tablet 0  . amLODipine (NORVASC) 10 MG tablet Take 1 tablet (10 mg total) by mouth daily. 30 tablet 0  . aspirin 325 MG tablet Take 325 mg by mouth daily as needed for mild pain or headache.    . cloNIDine (CATAPRES) 0.1 MG tablet 1 tablet 3 times a day 90 tablet 0   No facility-administered medications prior to visit.    ROS Review of Systems  Constitutional: Negative for fever and  chills.  HENT: Positive for dental problem.   Eyes: Negative for visual disturbance.  Respiratory: Negative for shortness of breath.   Cardiovascular: Negative for chest pain.  Gastrointestinal: Negative for abdominal pain and blood in stool.  Musculoskeletal: Positive for arthralgias. Negative for back pain.  Skin: Negative for rash.  Allergic/Immunologic: Negative for immunocompromised state.  Hematological: Negative for adenopathy. Does not bruise/bleed easily.  Psychiatric/Behavioral: Negative for suicidal ideas and dysphoric mood.    Objective:  BP 115/79 mmHg  Pulse 83  Temp(Src) 97.9 F (36.6 C) (Oral)  Resp 16  Ht 5' 3.5" (1.613 m)  Wt 124 lb (56.246 kg)  BMI 21.62 kg/m2  SpO2 99%  BP/Weight 02/03/2016 [id number]  Systolic BP [id number]  Diastolic BP 79 89 [id number]  Wt. (Lbs) 124 - -  BMI 21.62 - -    Physical Exam  Constitutional: She is oriented to person, place, and time. She appears well-developed and well-nourished. No distress.  HENT:  Head: Normocephalic and atraumatic.  Mouth/Throat: She does not have dentures. No oral lesions. No trismus in the jaw. Abnormal dentition. No dental abscesses, uvula swelling or lacerations.    Cardiovascular: Normal rate, regular rhythm, normal heart sounds and intact distal pulses.   Pulmonary/Chest: Effort normal and breath sounds normal.  Musculoskeletal: She exhibits no edema.       Feet:  Neurological: She is alert and oriented to person, place, and time.  Skin: Skin is warm and dry. No rash noted.  Psychiatric: She has a normal mood and affect.      Chemistry      Component Value Date/Time   NA 141 01/05/2016 1449   K 4.2 01/05/2016 1449   CL 106 01/05/2016 1449   CO2 25 04/29/2015 1101   BUN 33* 01/05/2016 1449   CREATININE 2.40* 01/05/2016 1449   CREATININE 2.09* 04/29/2015 1101      Component Value Date/Time   CALCIUM 9.7 04/29/2015 1101   ALKPHOS 122* 04/04/2014 2141   AST 29 04/04/2014 2141   ALT  20 04/04/2014 2141   BILITOT 0.3 04/04/2014 2141       Assessment & Plan:   There are no diagnoses linked to this encounter. Rebecca Marquez was seen today for hypertension.  Diagnoses and all orders for this visit:  Renovascular hypertension -     amLODipine (NORVASC) 10 MG tablet; Take 1 tablet (10 mg total) by mouth daily. -     cloNIDine (CATAPRES) 0.1 MG tablet; 1 tablet 3 times a day -     chlorthalidone (HYGROTON) 25 MG tablet; Take 1 tablet (25 mg total) by mouth daily. -     Ambulatory referral to Nephrology  Glossitis -     chlorhexidine (PERIDEX) 0.12 % solution; Use as directed 15 mLs in the mouth or throat 2 (two) times daily. For 4 days  Right foot pain -     Discontinue: Diclofenac Sodium 3 % GEL; Place 1 application onto the skin as needed. -     diclofenac sodium (VOLTAREN) 1 % GEL; Apply 2 g topically 4 (four) times daily.  Visit for screening mammogram -     MM DIGITAL SCREENING BILATERAL; Future  CKD (chronic kidney disease) stage 4, GFR 15-29 ml/min (HCC) -     Ambulatory referral to Nephrology   Meds ordered this encounter  Medications  . amLODipine (NORVASC) 10 MG tablet    Sig: Take 1 tablet (10 mg total) by mouth daily.    Dispense:  30 tablet    Refill:  5  . cloNIDine (CATAPRES) 0.1 MG tablet    Sig: 1 tablet 3 times a day    Dispense:  90 tablet    Refill:  0  . chlorthalidone (HYGROTON) 25 MG tablet    Sig: Take 1 tablet (25 mg total) by mouth daily.    Dispense:  30 tablet    Refill:  5  . Diclofenac Sodium 3 % GEL    Sig: Place 1 application onto the skin as needed.    Dispense:  100 g    Refill:  2  . chlorhexidine (PERIDEX) 0.12 % solution    Sig: Use as directed 15 mLs in the mouth or throat 2 (two) times daily. For 4 days    Dispense:  120 mL    Refill:  0    Follow-up: No Follow-up on file.   Boykin Nearing MD

## 2016-02-04 MED ORDER — DICLOFENAC SODIUM 1 % TD GEL: 2.0000 g | Freq: Four times a day (QID) | TRANSDERMAL | Status: DC

## 2016-02-04 MED FILL — VOLTAREN 1% GEL: 1 | 22 days supply | Qty: 100 | Fill #0

## 2016-02-06 DIAGNOSIS — K089 Disorder of teeth and supporting structures, unspecified: Secondary | ICD-10-CM | POA: Insufficient documentation

## 2016-02-06 NOTE — Assessment & Plan Note (Signed)
Well controlled BP Tapering off clonidine as tolerated by BP

## 2016-02-06 NOTE — Assessment & Plan Note (Signed)
Rise in Cr, GFR < 30  Renal referral placed  Repeat renal ordered

## 2016-02-06 NOTE — Assessment & Plan Note (Signed)
No evidence of infection This is chronic Very poor dentition Dental referral placed

## 2016-02-07 ENCOUNTER — Telehealth: Payer: Self-pay | Admitting: Family Medicine

## 2016-02-07 NOTE — Telephone Encounter (Signed)
Pt. Called stating that she was prescribed chlorthalidone (HYGROTON) 25 MG tablet  And she is also taking cloNIDine (CATAPRES) 0.1 MG tablet. Pt. Has some questions to  Ask her nurse about both medications. Please f/u with pt.

## 2016-02-13 ENCOUNTER — Encounter: Payer: Self-pay | Admitting: Family Medicine

## 2016-02-13 NOTE — Telephone Encounter (Signed)
Called patient back Left VM requesting a return call. Advised patient leave a detailed message regarding her med changes when she calls back if she is unable to speak to me or my medical assistant when she call.   I also sent her a Pharmacist, community message.

## 2016-02-14 NOTE — Telephone Encounter (Signed)
Patient called in, phone call was routed to me.  She wanted to know what to do with 3 BP meds Take chlorthalidone, this is new  Take norvasc, this is old Take clonidine TID, this is old and we are attempting to taper her off of this one.   Check BP < 120/80  Change clonidine to BID   Patient agreed with plan and voiced understanding

## 2016-03-16 ENCOUNTER — Other Ambulatory Visit: Payer: Self-pay | Admitting: Family Medicine

## 2016-03-16 ENCOUNTER — Ambulatory Visit (HOSPITAL_COMMUNITY)
Admission: EM | Admit: 2016-03-16 | Discharge: 2016-03-16 | Disposition: A | Discharge status: Home / Self Care | Payer: Self-pay | Attending: Family Medicine | Admitting: Family Medicine

## 2016-03-16 ENCOUNTER — Encounter (HOSPITAL_COMMUNITY): Payer: Self-pay | Admitting: Emergency Medicine

## 2016-03-16 DIAGNOSIS — R519 Headache, unspecified: Secondary | ICD-10-CM

## 2016-03-16 DIAGNOSIS — IMO0001 Reserved for inherently not codable concepts without codable children: Secondary | ICD-10-CM

## 2016-03-16 DIAGNOSIS — R030 Elevated blood-pressure reading, without diagnosis of hypertension: Secondary | ICD-10-CM

## 2016-03-16 DIAGNOSIS — R51 Headache: Secondary | ICD-10-CM

## 2016-03-16 DIAGNOSIS — R110 Nausea: Secondary | ICD-10-CM

## 2016-03-16 MED ORDER — CLONIDINE HCL 0.1 MG PO TABS: 0.1000 mg | ORAL_TABLET | Freq: Two times a day (BID) | ORAL | Status: DC

## 2016-03-16 MED FILL — cloNIDine HCL 0.1 MG TABS: 0.1 | 4 days supply | Qty: 8 | Fill #0

## 2016-03-16 NOTE — ED Provider Notes (Signed)
CSN: [id number]     Arrival date & time 03/16/16  1415 History   First MD Initiated Contact with Patient 03/16/16 1559     Chief Complaint  Patient presents with  . Withdrawal   (Consider location/radiation/quality/duration/timing/severity/associated sxs/prior Treatment) HPI  Rebecca Marquez is a 55 y.o. female presenting to UC with c/o generalized headache and "feeling funny" for about 1 week.  Headache is mild aching and throbbing, mild relief with acetaminophen, took last night but ran out of Tylenol.  Associated mild nausea w/o vomiting.  Denies chest pain or SOB.  Pt believes headache and nausea is due to running out of her blood pressure medication, Clonidine.  She notes her PCP has been trying to take her off clonidine but she has not been able to f/u with PCP due to her work schedule. She tried going today but no appointments were available. Per medical records, as of 02/07/16, pt was suppose to start taking her Clonidine BID instead of TID. Pt states she was unaware of this and had been taking TID until she ran out 5 days ago.  She has been taking her other BP medication- amlodipine and chlorthalidone as prescribed.   Past Medical History  Diagnosis Date  . Seasonal allergies   . Hypertension Dx March 2015  . Renal disorder     R/T HTN  . Kidney disease    Past Surgical History  Procedure Laterality Date  . Tubal ligation     Family History  Problem Relation Age of Onset  . Diabetes Mother   . Multiple sclerosis Daughter 91   Social History  Substance Use Topics  . Smoking status: Former Smoker -- 0.50 packs/day for 20 years    Types: Cigarettes    Quit date: 08/31/2001  . Smokeless tobacco: Never Used  . Alcohol Use: No   OB History    No data available     Review of Systems  Constitutional: Negative for fever and chills.  Eyes: Negative for photophobia and visual disturbance.  Respiratory: Negative for chest tightness, shortness of breath and wheezing.    Cardiovascular: Negative for chest pain, palpitations and leg swelling.  Gastrointestinal: Positive for nausea. Negative for vomiting and abdominal pain.  Neurological: Positive for light-headedness and headaches. Negative for dizziness, syncope, weakness and numbness.    Allergies  Losartan  Home Medications   Prior to Admission medications   Medication Sig Start Date End Date Taking? Authorizing Provider  amLODipine (NORVASC) 10 MG tablet Take 1 tablet (10 mg total) by mouth daily. 02/03/16  Yes Josalyn Funches, MD  acetaminophen (TYLENOL) 500 MG tablet Take 1 tablet (500 mg total) by mouth every 6 (six) hours as needed. Patient taking differently: Take 500 mg by mouth daily as needed for headache.  06/25/15   Waynetta Pean, PA-C  aspirin 325 MG tablet Take 325 mg by mouth daily as needed for mild pain or headache.    Historical Provider, MD  chlorhexidine (PERIDEX) 0.12 % solution Use as directed 15 mLs in the mouth or throat 2 (two) times daily. For 4 days 02/03/16   Boykin Nearing, MD  chlorthalidone (HYGROTON) 25 MG tablet Take 1 tablet (25 mg total) by mouth daily. 02/03/16   Josalyn Funches, MD  cloNIDine (CATAPRES) 0.1 MG tablet Take 1 tablet (0.1 mg total) by mouth 2 (two) times daily. 03/16/16   Noland Fordyce, PA-C  diclofenac sodium (VOLTAREN) 1 % GEL Apply 2 g topically 4 (four) times daily. 02/04/16   Josalyn Funches,  MD   Meds Ordered and Administered this Visit  Medications - No data to display  BP 157/101 mmHg  Pulse 95  Temp(Src) 98.4 F (36.9 C) (Oral)  Resp 12  SpO2 100% No data found.   Physical Exam  Constitutional: She is oriented to person, place, and time. She appears well-developed and well-nourished.  HENT:  Head: Normocephalic and atraumatic.  Eyes: EOM are normal. Pupils are equal, round, and reactive to light. Right eye exhibits no discharge. Left eye exhibits no discharge.  Neck: Normal range of motion.  Cardiovascular: Normal rate, regular rhythm and  normal heart sounds.   Pulmonary/Chest: Effort normal and breath sounds normal. No respiratory distress. She has no wheezes. She has no rales.  Abdominal: Soft. Bowel sounds are normal. She exhibits no distension. There is no tenderness.  Musculoskeletal: Normal range of motion.  Neurological: She is alert and oriented to person, place, and time. No cranial nerve deficit. Coordination normal.  Skin: Skin is warm and dry.  Psychiatric: She has a normal mood and affect. Her behavior is normal.  Nursing note and vitals reviewed.   ED Course  Procedures (including critical care time)  Labs Review Labs Reviewed - No data to display  Imaging Review No results found.   MDM   1. Elevated blood pressure   2. Generalized headache   3. Nausea    Pt c/o headache from elevated BP, pt believes due to being off her clonidine for 5 days. She had been taking TID, however, per medical records, pt was suppose to be taking BID to slowly taper off.   Strongly advised pt importance of f/u this week with her PCP, will wrist for 4 days of clonidine BID.  Discussed symptoms that warrant emergent care in the ED.Marland Kitchen Patient verbalized understanding and agreement with treatment plan.     Noland Fordyce, PA-C 03/16/16 [phone]

## 2016-03-16 NOTE — ED Notes (Signed)
Pt reports withdrawal from clonidine... Reports PCP is trying to take her off clonidine and has not had one since Wednesday of last week Sx today include HA and "feeling funny" A&O x4... NAD

## 2016-03-16 NOTE — Discharge Instructions (Signed)
°  You have been prescribed 4 days of clonidine to take One pill Twice daily. It is very important to follow up with your primary care provider or the blood pressure nurse/pharmacist THIS week to evaluate you blood pressure and review your medications.

## 2016-03-20 MED FILL — cloNIDine HCL 0.1 MG TABS: 0.1 | 30 days supply | Qty: 90 | Fill #0

## 2016-04-14 MED FILL — AMLODIPINE BESYLATE 10 MG T: 10 | 30 days supply | Qty: 30 | Fill #1 | Status: TO

## 2016-04-21 ENCOUNTER — Other Ambulatory Visit: Payer: Self-pay | Admitting: Family Medicine

## 2016-04-21 MED ORDER — CLONIDINE HCL 0.1 MG PO TABS: 0.1000 mg | ORAL_TABLET | Freq: Two times a day (BID) | ORAL | 1 refills | Status: DC

## 2016-04-21 NOTE — Telephone Encounter (Signed)
Pt. Called again requesting a refill on Clonidine. Pt. States that if she is not taking the medication she can end up in the ED. Please f/u

## 2016-04-21 NOTE — Telephone Encounter (Signed)
Patient is needing a refill for Clonodine. Please follow up.

## 2016-04-21 NOTE — Telephone Encounter (Signed)
Pt. Called requesting a refill on cloNIDine (CATAPRES) 0.1 MG tablet. Pt. States that she really needs the medication. Please f/u with pt.

## 2016-04-21 NOTE — Telephone Encounter (Signed)
Will forward to Dr. Adrian Blackwater - patient was supposed to be tapering off of clonidine.

## 2016-04-22 ENCOUNTER — Telehealth: Payer: Self-pay | Admitting: Family Medicine

## 2016-04-22 ENCOUNTER — Telehealth: Payer: Self-pay

## 2016-04-22 MED FILL — ?CLONIDINE HCL 0.1 MG TABL: 0.1 | 30 days supply | Qty: 60 | Fill #0

## 2016-04-22 NOTE — Telephone Encounter (Signed)
Patient refill was sent to pharmacy on 8/22. Called patient to inform her of script being sent over , but no answer.

## 2016-04-22 NOTE — Telephone Encounter (Signed)
Pt called back returning nurse's call. Pt is aware rx was sent to pharmacy.

## 2016-05-14 ENCOUNTER — Telehealth: Payer: Self-pay | Admitting: Family Medicine

## 2016-05-14 NOTE — Telephone Encounter (Signed)
Pt. Called requesting to speak with her nurse b/c she is taking cloNIDine (CATAPRES) 0.1 MG tablet And the Rx is for twice daily and pt. Stets she takes it three times daily. The pharmacy will not refill it b/c she It to early for a refill. Please f/u with pt.

## 2016-05-15 ENCOUNTER — Other Ambulatory Visit: Payer: Self-pay

## 2016-05-15 MED ORDER — CLONIDINE HCL 0.1 MG PO TABS: 0.1000 mg | ORAL_TABLET | Freq: Three times a day (TID) | ORAL | 1 refills | Status: DC

## 2016-05-15 NOTE — Telephone Encounter (Signed)
Pt script was sent over to pharmacy on 9/15.

## 2016-05-15 NOTE — Telephone Encounter (Signed)
Pt. Called requesting to speak with her nurse b/c she is taking cloNIDine (CATAPRES) 0.1 MG tablet And the Rx is for twice daily and pt. Stets she takes it three times daily. The pharmacy will not refill it b/c she It to early for a refill. Pt. Also states that the medication she was put on is not helping and she needs to stay with  Clonidine. Please f/u with pt.

## 2016-06-02 ENCOUNTER — Other Ambulatory Visit: Payer: Self-pay | Admitting: Family Medicine

## 2016-06-02 DIAGNOSIS — I10 Essential (primary) hypertension: Secondary | ICD-10-CM

## 2016-07-20 ENCOUNTER — Telehealth: Payer: Self-pay | Admitting: Family Medicine

## 2016-07-20 MED ORDER — CLONIDINE HCL 0.1 MG PO TABS: 0.1000 mg | ORAL_TABLET | Freq: Three times a day (TID) | ORAL | 0 refills | Status: DC

## 2016-07-20 NOTE — Telephone Encounter (Signed)
Requested medication refilled x 30 days

## 2016-07-20 NOTE — Telephone Encounter (Signed)
cloNIDine (CATAPRES) 0.1 MG tablet [[id number]]   patient requesting refill...  Patient made aware that she is due for follow up, patient stated she just started new job and will call to schedule appt. Once her settled in

## 2016-08-20 ENCOUNTER — Telehealth: Payer: Self-pay | Admitting: Family Medicine

## 2016-08-20 NOTE — Telephone Encounter (Signed)
Pt. Called requesting a refill on cloNIDine (CATAPRES) 0.1 MG tablet  Pt. Has an appt. For 12/29 with her PCP.  Please f/u

## 2016-08-21 MED ORDER — CLONIDINE HCL 0.1 MG PO TABS: 0.1000 mg | ORAL_TABLET | Freq: Three times a day (TID) | ORAL | 0 refills | Status: DC

## 2016-08-21 NOTE — Telephone Encounter (Signed)
Clonidine refilled.

## 2016-08-28 ENCOUNTER — Ambulatory Visit: Payer: Self-pay | Admitting: Family Medicine

## 2016-09-07 ENCOUNTER — Ambulatory Visit (HOSPITAL_COMMUNITY)
Admission: EM | Admit: 2016-09-07 | Discharge: 2016-09-07 | Disposition: A | Discharge status: Home / Self Care | Payer: Managed Care, Other (non HMO) | Attending: Emergency Medicine | Admitting: Emergency Medicine

## 2016-09-07 ENCOUNTER — Encounter (HOSPITAL_COMMUNITY): Payer: Self-pay | Admitting: *Deleted

## 2016-09-07 DIAGNOSIS — L03213 Periorbital cellulitis: Secondary | ICD-10-CM

## 2016-09-07 MED ORDER — CEPHALEXIN 500 MG PO CAPS: 500.0000 mg | ORAL_CAPSULE | Freq: Four times a day (QID) | ORAL | 0 refills | Status: DC

## 2016-09-07 NOTE — Discharge Instructions (Signed)
You have a small infection of the eyelids. Take Keflex 4 times a day for 1 week. Apply warm compresses several times a day. Make sure you are drinking plenty of fluids. Follow-up if not improving in 2-3 days. The cough you have may linger for another several weeks.

## 2016-09-07 NOTE — ED Triage Notes (Signed)
Face    Swelling   And  l  Eye draining    For  severearal  Days    Pt  Reports  Pain in   The  Affected  Face       Pt    Sitting  Upright  On the  Exam table  In no  Acute  Distress     Speaking  In  Complete   sentances

## 2016-09-07 NOTE — ED Provider Notes (Signed)
[place]    CSN: [id number] Arrival date & time: 09/07/16  1001     History   Chief Complaint Chief Complaint  Patient presents with  . Facial Swelling    HPI Rebecca Marquez is a 56 y.o. female.   HPI  She is a 56 year old woman here for evaluation of left side pain and swelling. This started about 2 days ago. He reports pain and swelling, primarily underneath the left eye. Today, she started having some white to clear drainage from the eye. She denies any eye redness. No pain with eye movements. She states her vision was a little blurred yesterday, but this seems to have resolved. No fevers. She was sick with flulike symptoms last week and has a persistent cough, but otherwise feels like that is starting to improve.  Past Medical History:  Diagnosis Date  . Hypertension Dx March 2015  . Kidney disease   . Renal disorder    R/T HTN  . Seasonal allergies     Patient Active Problem List   Diagnosis Date Noted  . Poor dentition 02/06/2016  . Glossitis 04/29/2015  . HTN (hypertension) 12/11/2013  . HLD (hyperlipidemia) 11/16/2013  . CKD (chronic kidney disease) stage 4, GFR 15-29 ml/min (HCC) 11/16/2013    Past Surgical History:  Procedure Laterality Date  . TUBAL LIGATION      OB History    No data available       Home Medications    Prior to Admission medications   Medication Sig Start Date End Date Taking? Authorizing Provider  acetaminophen (TYLENOL) 500 MG tablet Take 1 tablet (500 mg total) by mouth every 6 (six) hours as needed. Patient taking differently: Take 500 mg by mouth daily as needed for headache.  06/25/15   Waynetta Pean, PA-C  amLODipine (NORVASC) 10 MG tablet Take 1 tablet (10 mg total) by mouth daily. 02/03/16   Boykin Nearing, MD  aspirin 325 MG tablet Take 325 mg by mouth daily as needed for mild pain or headache.    Historical Provider, MD  cephALEXin (KEFLEX) 500 MG capsule Take 1 capsule (500 mg total) by mouth 4 (four) times  daily. 09/07/16   Melony Overly, MD  chlorhexidine (PERIDEX) 0.12 % solution Use as directed 15 mLs in the mouth or throat 2 (two) times daily. For 4 days 02/03/16   Boykin Nearing, MD  chlorthalidone (HYGROTON) 25 MG tablet Take 1 tablet (25 mg total) by mouth daily. 02/03/16   Josalyn Funches, MD  cloNIDine (CATAPRES) 0.1 MG tablet Take 1 tablet (0.1 mg total) by mouth 3 (three) times daily. 08/21/16   Josalyn Funches, MD  diclofenac sodium (VOLTAREN) 1 % GEL Apply 2 g topically 4 (four) times daily. 02/04/16   Boykin Nearing, MD    Family History Family History  Problem Relation Age of Onset  . Diabetes Mother   . Multiple sclerosis Daughter 55    Social History Social History  Substance Use Topics  . Smoking status: Former Smoker    Packs/day: 0.50    Years: 20.00    Types: Cigarettes    Quit date: 08/31/2001  . Smokeless tobacco: Never Used  . Alcohol use No     Allergies   Losartan   Review of Systems Review of Systems As in history of present illness  Physical Exam Triage Vital Signs ED Triage Vitals  Enc Vitals Group     BP 09/07/16 1016 131/85     Pulse Rate 09/07/16 1016  81     Resp 09/07/16 1016 18     Temp 09/07/16 1016 98.7 F (37.1 C)     Temp Source 09/07/16 1016 Oral     SpO2 09/07/16 1016 100 %     Weight --      Height --      Head Circumference --      Peak Flow --      Pain Score 09/07/16 1017 7     Pain Loc --      Pain Edu? --      Excl. in [place]? --    No data found.   Updated Vital Signs BP 131/85 (BP Location: Right Arm)   Pulse 81   Temp 98.7 F (37.1 C) (Oral)   Resp 18   SpO2 100%   Visual Acuity Right Eye Distance:   Left Eye Distance:   Bilateral Distance:    Right Eye Near:   Left Eye Near:    Bilateral Near:     Physical Exam  Constitutional: She is oriented to person, place, and time. She appears well-developed and well-nourished. No distress.  HENT:  Mouth/Throat: Oropharynx is clear and moist. No oropharyngeal  exudate.  Nasal mucosa boggy.  Eyes: Conjunctivae and EOM are normal. Pupils are equal, round, and reactive to light. Right eye exhibits no discharge. Left eye exhibits discharge (clear).  Mild swelling and tenderness, most notable of the left lower eyelid, less so left upper eyelid.  Neck: Neck supple.  Cardiovascular: Normal rate, regular rhythm and normal heart sounds.   No murmur heard. Pulmonary/Chest: Effort normal and breath sounds normal. No respiratory distress. She has no wheezes. She has no rales.  Lymphadenopathy:    She has no cervical adenopathy.  Neurological: She is alert and oriented to person, place, and time.     UC Treatments / Results  Labs (all labs ordered are listed, but only abnormal results are displayed) Labs Reviewed - No data to display  EKG  EKG Interpretation None       Radiology No results found.  Procedures Procedures (including critical care time)  Medications Ordered in UC Medications - No data to display   Initial Impression / Assessment and Plan / UC Course  I have reviewed the triage vital signs and the nursing notes.  Pertinent labs & imaging results that were available during my care of the patient were reviewed by me and considered in my medical decision making (see chart for details).  Clinical Course     Treat for preseptal cellulitis with Keflex for one week. No fever, eye redness, pain with eye movements to suggest orbital cellulitis. Discussed that the cough from her flulike symptoms may linger for several weeks. Work note provided.  Final Clinical Impressions(s) / UC Diagnoses   Final diagnoses:  Preseptal cellulitis of left lower eyelid    New Prescriptions New Prescriptions   CEPHALEXIN (KEFLEX) 500 MG CAPSULE    Take 1 capsule (500 mg total) by mouth 4 (four) times daily.     Melony Overly, MD 09/07/16 [phone]

## 2016-09-25 ENCOUNTER — Telehealth: Payer: Self-pay | Admitting: Family Medicine

## 2016-09-25 ENCOUNTER — Encounter: Payer: Self-pay | Admitting: Family Medicine

## 2016-09-25 ENCOUNTER — Ambulatory Visit: Payer: Managed Care, Other (non HMO) | Attending: Family Medicine | Admitting: Family Medicine

## 2016-09-25 VITALS — BP 117/81 | HR 80 | Temp 98.1°F | Ht 63.5 in | Wt 127.8 lb

## 2016-09-25 DIAGNOSIS — H538 Other visual disturbances: Secondary | ICD-10-CM | POA: Diagnosis not present

## 2016-09-25 DIAGNOSIS — Z0000 Encounter for general adult medical examination without abnormal findings: Secondary | ICD-10-CM

## 2016-09-25 DIAGNOSIS — Z7982 Long term (current) use of aspirin: Secondary | ICD-10-CM | POA: Insufficient documentation

## 2016-09-25 DIAGNOSIS — I129 Hypertensive chronic kidney disease with stage 1 through stage 4 chronic kidney disease, or unspecified chronic kidney disease: Secondary | ICD-10-CM | POA: Diagnosis present

## 2016-09-25 DIAGNOSIS — Z87891 Personal history of nicotine dependence: Secondary | ICD-10-CM | POA: Diagnosis not present

## 2016-09-25 DIAGNOSIS — Z1231 Encounter for screening mammogram for malignant neoplasm of breast: Secondary | ICD-10-CM

## 2016-09-25 DIAGNOSIS — I150 Renovascular hypertension: Secondary | ICD-10-CM

## 2016-09-25 DIAGNOSIS — H539 Unspecified visual disturbance: Secondary | ICD-10-CM | POA: Diagnosis not present

## 2016-09-25 DIAGNOSIS — N184 Chronic kidney disease, stage 4 (severe): Secondary | ICD-10-CM | POA: Insufficient documentation

## 2016-09-25 LAB — CBC
HCT: 42.2 % (ref 35.0–45.0)
HEMOGLOBIN: 14 g/dL (ref 11.7–15.5)
MCH: 31.4 pg (ref 27.0–33.0)
MCHC: 33.2 g/dL (ref 32.0–36.0)
MCV: 94.6 fL (ref 80.0–100.0)
MPV: 9.6 fL (ref 7.5–12.5)
Platelets: 244 10*3/uL (ref 140–400)
RBC: 4.46 MIL/uL (ref 3.80–5.10)
RDW: 15.2 % — ABNORMAL HIGH (ref 11.0–15.0)
WBC: 4 10*3/uL (ref 3.8–10.8)

## 2016-09-25 MED ORDER — ASPIRIN 81 MG PO TABS: 81.0000 mg | ORAL_TABLET | Freq: Every day | ORAL | 5 refills | Status: DC

## 2016-09-25 MED ORDER — CLONIDINE HCL 0.1 MG PO TABS: 0.1000 mg | ORAL_TABLET | Freq: Three times a day (TID) | ORAL | 5 refills | Status: DC

## 2016-09-25 MED ORDER — AMLODIPINE BESYLATE 10 MG PO TABS: 10.0000 mg | ORAL_TABLET | Freq: Every day | ORAL | 5 refills | Status: DC

## 2016-09-25 NOTE — Telephone Encounter (Signed)
Rebecca Marquez from [facility] called requesting orders for a CT scan of the head without contrast. He would like the order faxed to [facility] at [phone]. Please f/u

## 2016-09-25 NOTE — Telephone Encounter (Signed)
Will forward to pcp

## 2016-09-25 NOTE — Progress Notes (Signed)
Subjective:  Patient ID: Rebecca Marquez, female    DOB: April 20, 1961  Age: 56 y.o. MRN: [id number]  CC: Hypertension   HPI Rebecca Marquez has HTN and CKD stage 4 she presents for    1. CHRONIC HYPERTENSION since 2013. Has bilateral renal cortical scarring noted on CT abdomen and pelvis in 03/2014.   Disease Monitoring  Blood pressure range: not checking   Chest pain: no   Dyspnea: no   Claudication: no   Medication compliance: yes, amlodipine and clonidine  Medication Side Effects  Lightheadedness: no   Urinary frequency: no   Edema: no    2. Blurry vision episode: occurred last month. While driving. Vision went blurry. Lasted < 1 minute. Symptoms resolved after taking a sip of water. No recurrent symptoms. At baseline she has poor vision. She is suppose.   3. HM: declines flu shot and tetanus shot. Amenable to mammogram and colonoscopy.  4. UC f/u sinusitis: almost done with keflex. Has pain and pressure along L face. No fever or chills. No facial swelling.  Social History  Substance Use Topics  . Smoking status: Former Smoker    Packs/day: 0.50    Years: 20.00    Types: Cigarettes    Quit date: 08/31/2001  . Smokeless tobacco: Never Used  . Alcohol use No    Outpatient Medications Prior to Visit  Medication Sig Dispense Refill  . acetaminophen (TYLENOL) 500 MG tablet Take 1 tablet (500 mg total) by mouth every 6 (six) hours as needed. (Patient taking differently: Take 500 mg by mouth daily as needed for headache. ) 30 tablet 0  . amLODipine (NORVASC) 10 MG tablet Take 1 tablet (10 mg total) by mouth daily. 30 tablet 5  . aspirin 325 MG tablet Take 325 mg by mouth daily as needed for mild pain or headache.    . cephALEXin (KEFLEX) 500 MG capsule Take 1 capsule (500 mg total) by mouth 4 (four) times daily. 28 capsule 0  . chlorhexidine (PERIDEX) 0.12 % solution Use as directed 15 mLs in the mouth or throat 2 (two) times daily. For 4 days 120 mL 0  . chlorthalidone (HYGROTON)  25 MG tablet Take 1 tablet (25 mg total) by mouth daily. 30 tablet 5  . cloNIDine (CATAPRES) 0.1 MG tablet Take 1 tablet (0.1 mg total) by mouth 3 (three) times daily. 90 tablet 0  . diclofenac sodium (VOLTAREN) 1 % GEL Apply 2 g topically 4 (four) times daily. (Patient not taking: Reported on 09/25/2016) 100 g 4   No facility-administered medications prior to visit.     ROS Review of Systems  Constitutional: Negative for chills and fever.  HENT: Positive for dental problem.   Eyes: Positive for visual disturbance.  Respiratory: Negative for shortness of breath.   Cardiovascular: Negative for chest pain.  Gastrointestinal: Negative for abdominal pain and blood in stool.  Musculoskeletal: Negative for arthralgias and back pain.  Skin: Negative for rash.  Allergic/Immunologic: Negative for immunocompromised state.  Hematological: Negative for adenopathy. Does not bruise/bleed easily.  Psychiatric/Behavioral: Negative for dysphoric mood and suicidal ideas.    Objective:  BP 117/81 (BP Location: Left Arm, Patient Position: Sitting, Cuff Size: Small)   Pulse 80   Temp 98.1 F (36.7 C) (Oral)   Ht 5' 3.5" (1.613 m)   Wt 127 lb 12.8 oz (58 kg)   SpO2 100%   BMI 22.28 kg/m   BP/Weight 09/25/2016 09/07/2016 0/96/2836  Systolic BP 629 476 546  Diastolic  BP 81 85 101  Wt. (Lbs) 127.8 - -  BMI 22.28 - -    Physical Exam  Constitutional: She is oriented to person, place, and time. She appears well-developed and well-nourished. No distress.  HENT:  Head: Normocephalic and atraumatic.  Mouth/Throat: She does not have dentures. No oral lesions. No trismus in the jaw. Abnormal dentition. No dental abscesses, uvula swelling or lacerations.    Eyes: Conjunctivae and EOM are normal. Pupils are equal, round, and reactive to light. Right eye exhibits no discharge. Left eye exhibits discharge. No scleral icterus.  Neck: Carotid bruit is not present.  Cardiovascular: Normal rate, regular rhythm,  normal heart sounds and intact distal pulses.   Pulmonary/Chest: Effort normal and breath sounds normal.  Musculoskeletal: She exhibits no edema.  Neurological: She is alert and oriented to person, place, and time. No cranial nerve deficit. She exhibits normal muscle tone. Coordination normal.  Skin: Skin is warm and dry. No rash noted.  Psychiatric: She has a normal mood and affect.      Chemistry      Component Value Date/Time   NA 141 01/05/2016 1449   K 4.2 01/05/2016 1449   CL 106 01/05/2016 1449   CO2 25 04/29/2015 1101   BUN 33 (H) 01/05/2016 1449   CREATININE 2.40 (H) 01/05/2016 1449   CREATININE 2.09 (H) 04/29/2015 1101      Component Value Date/Time   CALCIUM 9.7 04/29/2015 1101   ALKPHOS 122 (H) 04/04/2014 2141   AST 29 04/04/2014 2141   ALT 20 04/04/2014 2141   BILITOT 0.3 04/04/2014 2141       Assessment & Plan:  Rebecca Marquez was seen today for hypertension.  Diagnoses and all orders for this visit:  CKD (chronic kidney disease) stage 4, GFR 15-29 ml/min (HCC) -     COMPLETE METABOLIC PANEL WITH GFR  Renovascular hypertension -     amLODipine (NORVASC) 10 MG tablet; Take 1 tablet (10 mg total) by mouth daily. -     COMPLETE METABOLIC PANEL WITH GFR -     cloNIDine (CATAPRES) 0.1 MG tablet; Take 1 tablet (0.1 mg total) by mouth 3 (three) times daily. -     CBC -     aspirin 81 MG tablet; Take 1 tablet (81 mg total) by mouth daily. -     Lipid Panel  Transient vision disturbance of both eyes -     CT Head Wo Contrast; Future -     Ambulatory referral to Ophthalmology  Healthcare maintenance -     Ambulatory referral to Gastroenterology  Visit for screening mammogram -     MM DIGITAL SCREENING BILATERAL; Future   There are no diagnoses linked to this encounter. There are no diagnoses linked to this encounter. No orders of the defined types were placed in this encounter.   Follow-up: Return in about 4 weeks (around 10/23/2016) for vision loss .    Boykin Nearing MD

## 2016-09-25 NOTE — Patient Instructions (Addendum)
Rebecca Marquez was seen today for hypertension.  Diagnoses and all orders for this visit:  CKD (chronic kidney disease) stage 4, GFR 15-29 ml/min (HCC) -     COMPLETE METABOLIC PANEL WITH GFR  Renovascular hypertension -     amLODipine (NORVASC) 10 MG tablet; Take 1 tablet (10 mg total) by mouth daily. -     COMPLETE METABOLIC PANEL WITH GFR -     cloNIDine (CATAPRES) 0.1 MG tablet; Take 1 tablet (0.1 mg total) by mouth 3 (three) times daily. -     CBC -     aspirin 81 MG tablet; Take 1 tablet (81 mg total) by mouth daily. -     Lipid Panel  Transient vision disturbance of both eyes -     CT Head Wo Contrast; Future -     Ambulatory referral to Ophthalmology  Healthcare maintenance -     Ambulatory referral to Gastroenterology  Visit for screening mammogram -     MM DIGITAL SCREENING BILATERAL; Future   You will be called with results and appointments  Call to schedule your mammogram  F/u in 4 weeks for vision loss episode   Dr. Adrian Blackwater

## 2016-09-25 NOTE — Assessment & Plan Note (Signed)
Controlled Continue current regimen Check CMP

## 2016-09-25 NOTE — Assessment & Plan Note (Signed)
Concern for possible TIA Add aspirin CT head Opthalmology referral

## 2016-09-26 LAB — COMPLETE METABOLIC PANEL WITH GFR
ALK PHOS: 116 U/L (ref 33–130)
ALT: 13 U/L (ref 6–29)
AST: 21 U/L (ref 10–35)
Albumin: 3.8 g/dL (ref 3.6–5.1)
BILIRUBIN TOTAL: 0.4 mg/dL (ref 0.2–1.2)
BUN: 18 mg/dL (ref 7–25)
CHLORIDE: 111 mmol/L — AB (ref 98–110)
CO2: 20 mmol/L (ref 20–31)
CREATININE: 2.18 mg/dL — AB (ref 0.50–1.05)
Calcium: 9.3 mg/dL (ref 8.6–10.4)
GFR, Est African American: 29 mL/min — ABNORMAL LOW (ref >=60)
GFR, Est Non African American: 25 mL/min — ABNORMAL LOW (ref >=60)
GLUCOSE: 100 mg/dL — AB (ref 65–99)
Potassium: 4 mmol/L (ref 3.5–5.3)
Sodium: 141 mmol/L (ref 135–146)
TOTAL PROTEIN: 7.4 g/dL (ref 6.1–8.1)

## 2016-09-26 LAB — LIPID PANEL
CHOL/HDL RATIO: 4 ratio (ref <5.0)
CHOLESTEROL: 198 mg/dL (ref <200)
HDL: 50 mg/dL — ABNORMAL LOW (ref >50)
LDL Cholesterol: 131 mg/dL — ABNORMAL HIGH (ref <100)
TRIGLYCERIDES: 85 mg/dL (ref <150)
VLDL: 17 mg/dL (ref <30)

## 2016-09-28 ENCOUNTER — Encounter: Payer: Self-pay | Admitting: Family Medicine

## 2016-09-28 NOTE — Addendum Note (Signed)
Addended by: Boykin Nearing on: 09/28/2016 01:21 PM   Modules accepted: Orders

## 2016-09-29 NOTE — Telephone Encounter (Signed)
Pt. Called stating that she has her medications delivered to her home and she needs new Rx for her medication to be sent to [facility].  Please f/u  Phone: [phone]  [phone]

## 2016-09-30 ENCOUNTER — Ambulatory Visit (HOSPITAL_COMMUNITY): Admission: RE | Admit: 2016-09-30 | Payer: Managed Care, Other (non HMO) | Source: Ambulatory Visit

## 2016-09-30 NOTE — Telephone Encounter (Signed)
Will route to PCP 

## 2016-10-01 MED ORDER — ASPIRIN 81 MG PO TABS: 81.0000 mg | ORAL_TABLET | Freq: Every day | ORAL | 3 refills | Status: DC

## 2016-10-01 MED ORDER — CLONIDINE HCL 0.1 MG PO TABS: 0.1000 mg | ORAL_TABLET | Freq: Three times a day (TID) | ORAL | 3 refills | Status: DC

## 2016-10-01 MED ORDER — AMLODIPINE BESYLATE 10 MG PO TABS: 10.0000 mg | ORAL_TABLET | Freq: Every day | ORAL | 3 refills | Status: DC

## 2016-10-01 NOTE — Telephone Encounter (Signed)
Pharmacy changed in chart meds e-scribed to [facility] home delivery

## 2016-12-07 ENCOUNTER — Telehealth: Payer: Self-pay | Admitting: Family Medicine

## 2016-12-07 NOTE — Telephone Encounter (Signed)
Jeani Hawking from [facility] called to inform PCP and Alinda Sierras that patient was a no show to her appt. This was the second time patient had reschedule. If pt needs to be seen referral process needs to placed again for her.   Thank you.

## 2016-12-07 NOTE — Telephone Encounter (Signed)
Thank you :)

## 2016-12-07 NOTE — Telephone Encounter (Signed)
Will route to PCP 

## 2016-12-08 NOTE — Telephone Encounter (Signed)
Reviewed Will wait to see patient in f/u to discuss before placing another referral

## 2017-01-09 ENCOUNTER — Encounter (HOSPITAL_COMMUNITY): Payer: Self-pay | Admitting: Emergency Medicine

## 2017-01-09 ENCOUNTER — Ambulatory Visit (HOSPITAL_COMMUNITY)
Admission: EM | Admit: 2017-01-09 | Discharge: 2017-01-09 | Disposition: A | Discharge status: Home / Self Care | Payer: Managed Care, Other (non HMO) | Attending: Internal Medicine | Admitting: Internal Medicine

## 2017-01-09 DIAGNOSIS — R350 Frequency of micturition: Secondary | ICD-10-CM | POA: Diagnosis not present

## 2017-01-09 DIAGNOSIS — N390 Urinary tract infection, site not specified: Secondary | ICD-10-CM | POA: Diagnosis present

## 2017-01-09 DIAGNOSIS — N3000 Acute cystitis without hematuria: Secondary | ICD-10-CM | POA: Diagnosis not present

## 2017-01-09 LAB — POCT URINALYSIS DIP (DEVICE)
Bilirubin Urine: NEGATIVE
Glucose, UA: NEGATIVE mg/dL
Hgb urine dipstick: NEGATIVE
Ketones, ur: NEGATIVE mg/dL
Nitrite: NEGATIVE
Protein, ur: 100 mg/dL — AB
Specific Gravity, Urine: 1.025 (ref 1.005–1.030)
Urobilinogen, UA: 1 mg/dL (ref 0.0–1.0)
pH: 6 (ref 5.0–8.0)

## 2017-01-09 MED ORDER — NITROFURANTOIN MONOHYD MACRO 100 MG PO CAPS: 100.0000 mg | ORAL_CAPSULE | Freq: Two times a day (BID) | ORAL | 0 refills | Status: AC

## 2017-01-09 NOTE — Discharge Instructions (Signed)
Your urine today shows the you have a UTI. We'll call you when the urine culture result comes back. Take the antibiotic twice a day for 5 days. If you don't get better, please follow up with the primary care doctor.

## 2017-01-09 NOTE — ED Triage Notes (Signed)
Pt is here for UTI sx onset 1 week associated w/back pain, foul urine smell, urinary freq/urgency  Denies fevers, chills, dysuria  A&O x4... NAD

## 2017-01-09 NOTE — ED Provider Notes (Signed)
CSN: [id number]     Arrival date & time 01/09/17  1201 History   None    Chief Complaint  Patient presents with  . Urinary Tract Infection   (Consider location/radiation/quality/duration/timing/severity/associated sxs/prior Treatment) Patient is a 56 y.o. Female, here with concern for UTI. She reports low back pain of few days duration accompany by odorous urine for 2 weeks. She denies dysuria, urinary frequency, urinary urgency at times. She also denies abdominal pain, nausea, fever, or vaginal discharge.       Past Medical History:  Diagnosis Date  . Hypertension Dx March 2015  . Kidney disease   . Renal disorder    R/T HTN  . Seasonal allergies    Past Surgical History:  Procedure Laterality Date  . TUBAL LIGATION     Family History  Problem Relation Age of Onset  . Diabetes Mother   . Multiple sclerosis Daughter 28   Social History  Substance Use Topics  . Smoking status: Former Smoker    Packs/day: 0.50    Years: 20.00    Types: Cigarettes    Quit date: 08/31/2001  . Smokeless tobacco: Never Used  . Alcohol use No   OB History    No data available     Review of Systems  Constitutional: Negative for chills, fatigue and fever.  Respiratory: Negative.   Cardiovascular: Negative.   Gastrointestinal: Negative for abdominal pain, nausea and vomiting.  Genitourinary: Positive for frequency and urgency. Negative for dysuria, hematuria and vaginal discharge.       + Odorous urine  Musculoskeletal: Positive for back pain.  Neurological: Negative for dizziness and headaches.    Allergies  Losartan  Home Medications   Prior to Admission medications   Medication Sig Start Date End Date Taking? Authorizing Provider  amLODipine (NORVASC) 10 MG tablet Take 1 tablet (10 mg total) by mouth daily. 10/01/16  Yes Funches, Josalyn, MD  cloNIDine (CATAPRES) 0.1 MG tablet Take 1 tablet (0.1 mg total) by mouth 3 (three) times daily. 10/01/16  Yes Funches, Josalyn, MD   acetaminophen (TYLENOL) 500 MG tablet Take 1 tablet (500 mg total) by mouth every 6 (six) hours as needed. Patient taking differently: Take 500 mg by mouth daily as needed for headache.  06/25/15   Waynetta Pean, PA-C  aspirin 81 MG tablet Take 1 tablet (81 mg total) by mouth daily. 10/01/16   Funches, Adriana Mccallum, MD  cephALEXin (KEFLEX) 500 MG capsule Take 1 capsule (500 mg total) by mouth 4 (four) times daily. 09/07/16   Melony Overly, MD  nitrofurantoin, macrocrystal-monohydrate, (MACROBID) 100 MG capsule Take 1 capsule (100 mg total) by mouth 2 (two) times daily. 01/09/17 01/14/17  Barry Dienes, NP   Meds Ordered and Administered this Visit  Medications - No data to display  BP 113/79 (BP Location: Left Arm)   Pulse 91   Temp 98.6 F (37 C) (Oral)   Resp 18   SpO2 96%  No data found.   Physical Exam  Constitutional: She is oriented to person, place, and time. She appears well-developed and well-nourished.  Cardiovascular: Normal rate, regular rhythm and normal heart sounds.   Pulmonary/Chest: Effort normal and breath sounds normal.  Abdominal: Soft. Bowel sounds are normal. She exhibits no distension and no mass. There is no tenderness. There is no guarding.  Genitourinary:  Genitourinary Comments: - CVA Tenderness  Neurological: She is alert and oriented to person, place, and time.  Skin: Skin is warm and dry.  Psychiatric: She has a  normal mood and affect.  Nursing note and vitals reviewed.   Urgent Care Course     Procedures (including critical care time)  Labs Review Labs Reviewed  POCT URINALYSIS DIP (DEVICE) - Abnormal; Notable for the following:       Result Value   Protein, ur 100 (*)    Leukocytes, UA TRACE (*)    All other components within normal limits  URINE CULTURE    Imaging Review No results found.  MDM   1. Acute cystitis without hematuria    UA has trace of Leukocytes and protein. Given her symptoms, most likely an UTI. Urine culture is pending. RX  for macrobid 100 mg BID x 5 days send in to her pharmacy. Patient informed to f/u with PCP for no improvement.     Barry Dienes, NP 01/09/17 1252

## 2017-01-12 LAB — URINE CULTURE: Culture: >=100000 — AB

## 2017-01-13 ENCOUNTER — Encounter: Payer: Self-pay | Admitting: Family Medicine

## 2017-01-19 ENCOUNTER — Encounter (HOSPITAL_COMMUNITY): Payer: Self-pay | Admitting: Emergency Medicine

## 2017-01-19 DIAGNOSIS — Z5321 Procedure and treatment not carried out due to patient leaving prior to being seen by health care provider: Secondary | ICD-10-CM | POA: Diagnosis not present

## 2017-01-19 DIAGNOSIS — N939 Abnormal uterine and vaginal bleeding, unspecified: Secondary | ICD-10-CM | POA: Insufficient documentation

## 2017-01-19 LAB — CBC WITH DIFFERENTIAL/PLATELET
Basophils Absolute: 0.1 10*3/uL (ref 0.0–0.1)
Basophils Relative: 1 %
EOS PCT: 6 %
Eosinophils Absolute: 0.3 10*3/uL (ref 0.0–0.7)
HCT: 41.6 % (ref 36.0–46.0)
Hemoglobin: 13.9 g/dL (ref 12.0–15.0)
LYMPHS ABS: 2 10*3/uL (ref 0.7–4.0)
LYMPHS PCT: 40 %
MCH: 31.5 pg (ref 26.0–34.0)
MCHC: 33.4 g/dL (ref 30.0–36.0)
MCV: 94.3 fL (ref 78.0–100.0)
MONO ABS: 0.3 10*3/uL (ref 0.1–1.0)
Monocytes Relative: 6 %
Neutro Abs: 2.4 10*3/uL (ref 1.7–7.7)
Neutrophils Relative %: 47 %
PLATELETS: 228 10*3/uL (ref 150–400)
RBC: 4.41 MIL/uL (ref 3.87–5.11)
RDW: 15.4 % (ref 11.5–15.5)
WBC: 5 10*3/uL (ref 4.0–10.5)

## 2017-01-19 LAB — URINALYSIS, ROUTINE W REFLEX MICROSCOPIC
BACTERIA UA: NONE SEEN
Bilirubin Urine: NEGATIVE
Glucose, UA: NEGATIVE mg/dL
KETONES UR: NEGATIVE mg/dL
Nitrite: NEGATIVE
PH: 5 (ref 5.0–8.0)
Protein, ur: NEGATIVE mg/dL
Specific Gravity, Urine: 1.016 (ref 1.005–1.030)

## 2017-01-19 LAB — BASIC METABOLIC PANEL
Anion gap: 6 (ref 5–15)
BUN: 28 mg/dL — AB (ref 6–20)
CALCIUM: 8.9 mg/dL (ref 8.9–10.3)
CO2: 24 mmol/L (ref 22–32)
Chloride: 108 mmol/L (ref 101–111)
Creatinine, Ser: 2.37 mg/dL — ABNORMAL HIGH (ref 0.44–1.00)
GFR calc Af Amer: 26 mL/min — ABNORMAL LOW (ref >60)
GFR, EST NON AFRICAN AMERICAN: 22 mL/min — AB (ref >60)
Glucose, Bld: 95 mg/dL (ref 65–99)
POTASSIUM: 4.2 mmol/L (ref 3.5–5.1)
Sodium: 138 mmol/L (ref 135–145)

## 2017-01-19 LAB — I-STAT BETA HCG BLOOD, ED (MC, WL, AP ONLY): HCG, QUANTITATIVE: 10.5 m[IU]/mL — AB (ref <5)

## 2017-01-19 NOTE — ED Triage Notes (Signed)
Patient reports vaginal bleeding onset this evening , denies injury , no fever or dysuria .

## 2017-01-20 ENCOUNTER — Emergency Department (HOSPITAL_COMMUNITY)
Admission: EM | Admit: 2017-01-20 | Discharge: 2017-01-20 | Disposition: A | Discharge status: Home / Self Care | Payer: Managed Care, Other (non HMO) | Attending: Emergency Medicine | Admitting: Emergency Medicine

## 2017-01-20 ENCOUNTER — Encounter (HOSPITAL_COMMUNITY): Payer: Self-pay | Admitting: Emergency Medicine

## 2017-01-20 ENCOUNTER — Ambulatory Visit (INDEPENDENT_AMBULATORY_CARE_PROVIDER_SITE_OTHER)
Admission: EM | Admit: 2017-01-20 | Discharge: 2017-01-20 | Disposition: A | Discharge status: Home / Self Care | Payer: Managed Care, Other (non HMO) | Source: Home / Self Care

## 2017-01-20 DIAGNOSIS — R300 Dysuria: Secondary | ICD-10-CM

## 2017-01-20 DIAGNOSIS — N898 Other specified noninflammatory disorders of vagina: Secondary | ICD-10-CM | POA: Insufficient documentation

## 2017-01-20 DIAGNOSIS — Z87891 Personal history of nicotine dependence: Secondary | ICD-10-CM

## 2017-01-20 DIAGNOSIS — Z3202 Encounter for pregnancy test, result negative: Secondary | ICD-10-CM

## 2017-01-20 DIAGNOSIS — N3001 Acute cystitis with hematuria: Secondary | ICD-10-CM | POA: Diagnosis not present

## 2017-01-20 DIAGNOSIS — J302 Other seasonal allergic rhinitis: Secondary | ICD-10-CM

## 2017-01-20 DIAGNOSIS — N289 Disorder of kidney and ureter, unspecified: Secondary | ICD-10-CM

## 2017-01-20 DIAGNOSIS — I10 Essential (primary) hypertension: Secondary | ICD-10-CM | POA: Insufficient documentation

## 2017-01-20 LAB — POCT URINALYSIS DIP (DEVICE)
BILIRUBIN URINE: NEGATIVE
Glucose, UA: NEGATIVE mg/dL
KETONES UR: NEGATIVE mg/dL
Nitrite: NEGATIVE
Protein, ur: 30 mg/dL — AB
SPECIFIC GRAVITY, URINE: 1.02 (ref 1.005–1.030)
Urobilinogen, UA: 0.2 mg/dL (ref 0.0–1.0)
pH: 6 (ref 5.0–8.0)

## 2017-01-20 LAB — POCT PREGNANCY, URINE: PREG TEST UR: NEGATIVE

## 2017-01-20 MED ORDER — CEPHALEXIN 500 MG PO CAPS: 500.0000 mg | ORAL_CAPSULE | Freq: Four times a day (QID) | ORAL | 0 refills | Status: DC

## 2017-01-20 MED ORDER — PHENAZOPYRIDINE HCL 200 MG PO TABS: 200.0000 mg | ORAL_TABLET | Freq: Three times a day (TID) | ORAL | 0 refills | Status: DC

## 2017-01-20 MED ORDER — METRONIDAZOLE 500 MG PO TABS: 500.0000 mg | ORAL_TABLET | Freq: Two times a day (BID) | ORAL | 0 refills | Status: DC

## 2017-01-20 NOTE — ED Provider Notes (Signed)
CSN: [id number]     Arrival date & time 01/20/17  1026 History   None    Chief Complaint  Patient presents with  . Back Pain  . Vaginal Discharge   (Consider location/radiation/quality/duration/timing/severity/associated sxs/prior Treatment) Patient c/o vaginal DC and lower back pain and dysuria.  Patient went to ED last night but the wait was too long so she left. She did labs.   The history is provided by the patient.  Back Pain  Location:  Generalized Quality:  Aching Radiates to:  Does not radiate Pain severity:  Mild Pain is:  Worse during the day Onset quality:  Sudden Duration:  2 days Timing:  Constant Progression:  Worsening Chronicity:  New Relieved by:  Nothing Worsened by:  Nothing Ineffective treatments:  None tried Associated symptoms: dysuria   Vaginal Discharge  Associated symptoms: dysuria     Past Medical History:  Diagnosis Date  . Hypertension Dx March 2015  . Kidney disease   . Renal disorder    R/T HTN  . Seasonal allergies    Past Surgical History:  Procedure Laterality Date  . TUBAL LIGATION     Family History  Problem Relation Age of Onset  . Diabetes Mother   . Multiple sclerosis Daughter 84   Social History  Substance Use Topics  . Smoking status: Former Smoker    Packs/day: 0.50    Years: 20.00    Types: Cigarettes    Quit date: 08/31/2001  . Smokeless tobacco: Never Used  . Alcohol use No   OB History    No data available     Review of Systems  Constitutional: Negative.   HENT: Negative.   Eyes: Negative.   Respiratory: Negative.   Cardiovascular: Negative.   Gastrointestinal: Negative.   Endocrine: Negative.   Genitourinary: Positive for dysuria and vaginal discharge.  Musculoskeletal: Positive for back pain.  Allergic/Immunologic: Negative.   Neurological: Negative.   Hematological: Negative.   Psychiatric/Behavioral: Negative.  Negative for agitation.    Allergies  Losartan  Home Medications   Prior to  Admission medications   Medication Sig Start Date End Date Taking? Authorizing Provider  amLODipine (NORVASC) 10 MG tablet Take 1 tablet (10 mg total) by mouth daily. 10/01/16  Yes Funches, Josalyn, MD  cloNIDine (CATAPRES) 0.1 MG tablet Take 1 tablet (0.1 mg total) by mouth 3 (three) times daily. 10/01/16  Yes Funches, Josalyn, MD  cephALEXin (KEFLEX) 500 MG capsule Take 1 capsule (500 mg total) by mouth 4 (four) times daily. 01/20/17   Lysbeth Penner, FNP  metroNIDAZOLE (FLAGYL) 500 MG tablet Take 1 tablet (500 mg total) by mouth 2 (two) times daily. 01/20/17   Lysbeth Penner, FNP  phenazopyridine (PYRIDIUM) 200 MG tablet Take 1 tablet (200 mg total) by mouth 3 (three) times daily. 01/20/17   Lysbeth Penner, FNP   Meds Ordered and Administered this Visit  Medications - No data to display  BP 103/73 (BP Location: Right Arm)   Pulse 64   Temp 98.1 F (36.7 C) (Oral)   Resp 16   SpO2 98%  No data found.   Physical Exam  Constitutional: She appears well-developed and well-nourished.  HENT:  Head: Normocephalic and atraumatic.  Eyes: Conjunctivae and EOM are normal. Pupils are equal, round, and reactive to light.  Neck: Normal range of motion. Neck supple.  Cardiovascular: Normal rate, regular rhythm and normal heart sounds.   Pulmonary/Chest: Effort normal and breath sounds normal.  Abdominal: Soft. Bowel sounds are  normal.  Genitourinary: Vaginal discharge found.  Genitourinary Comments: BUS - wnl Vagina - yellow DC Cervix - Normal No CMT  Nursing note and vitals reviewed.   Urgent Care Course     Procedures (including critical care time)  Labs Review Labs Reviewed  POCT URINALYSIS DIP (DEVICE) - Abnormal; Notable for the following:       Result Value   Hgb urine dipstick TRACE (*)    Protein, ur 30 (*)    Leukocytes, UA SMALL (*)    All other components within normal limits  URINE CULTURE  POCT PREGNANCY, URINE  CERVICOVAGINAL ANCILLARY ONLY    Imaging  Review No results found.   Visual Acuity Review  Right Eye Distance:   Left Eye Distance:   Bilateral Distance:    Right Eye Near:   Left Eye Near:    Bilateral Near:         MDM   1. Acute cystitis with hematuria   2. Vaginal discharge    Flagyl 500mg  one po bid x 7 days #14 Keflex 500mg  one po qid x 7 days #28 Pyridium 200mg  one po tid x 2 days #6  UA CX Endocervical Cytology - GC/ Chlamydia Trich BD affirm     Lysbeth Penner, FNP 01/20/17 1243

## 2017-01-20 NOTE — ED Notes (Signed)
Pt upset about the wait  She left without being seen

## 2017-01-20 NOTE — ED Triage Notes (Signed)
The patient presented to the [facility] with a complaint of lower back pain, vaginal discharge and vaginal bleeding that started last night.

## 2017-01-21 LAB — URINE CULTURE: Culture: <10000 — AB

## 2017-01-21 LAB — CERVICOVAGINAL ANCILLARY ONLY
Bacterial vaginitis: POSITIVE — AB
Candida vaginitis: NEGATIVE
Chlamydia: NEGATIVE
Neisseria Gonorrhea: NEGATIVE
Trichomonas: POSITIVE — AB

## 2017-02-17 ENCOUNTER — Ambulatory Visit: Payer: Managed Care, Other (non HMO)

## 2017-04-27 ENCOUNTER — Telehealth: Payer: Self-pay | Admitting: Family Medicine

## 2017-04-27 DIAGNOSIS — I150 Renovascular hypertension: Secondary | ICD-10-CM

## 2017-04-27 MED ORDER — CLONIDINE HCL 0.1 MG PO TABS: 0.1000 mg | ORAL_TABLET | Freq: Three times a day (TID) | ORAL | 0 refills | Status: DC

## 2017-04-27 MED ORDER — AMLODIPINE BESYLATE 10 MG PO TABS: 10.0000 mg | ORAL_TABLET | Freq: Every day | ORAL | 0 refills | Status: DC

## 2017-04-27 NOTE — Telephone Encounter (Signed)
Pt. Called requesting a refill on the following medications:   amLODipine (NORVASC) 10 MG tablet  cloNIDine (CATAPRES) 0.1 MG tablet  Pt. Would like Rx sent to [facility] on [address].  Pt. Has scheduled an appt. For 05/17/17.

## 2017-04-27 NOTE — Telephone Encounter (Signed)
Refilled requested medications x 30

## 2017-05-06 ENCOUNTER — Ambulatory Visit (HOSPITAL_COMMUNITY)
Admission: EM | Admit: 2017-05-06 | Discharge: 2017-05-06 | Disposition: A | Discharge status: Home / Self Care | Payer: No Typology Code available for payment source | Attending: Internal Medicine | Admitting: Internal Medicine

## 2017-05-06 ENCOUNTER — Encounter (HOSPITAL_COMMUNITY): Payer: Self-pay | Admitting: Nurse Practitioner

## 2017-05-06 DIAGNOSIS — H539 Unspecified visual disturbance: Secondary | ICD-10-CM | POA: Diagnosis not present

## 2017-05-06 DIAGNOSIS — H1033 Unspecified acute conjunctivitis, bilateral: Secondary | ICD-10-CM | POA: Diagnosis not present

## 2017-05-06 MED ORDER — POLYMYXIN B-TRIMETHOPRIM 10000-0.1 UNIT/ML-% OP SOLN: 1.0000 [drp] | Freq: Four times a day (QID) | OPHTHALMIC | 0 refills | Status: DC

## 2017-05-06 MED ORDER — KETOTIFEN FUMARATE 0.025 % OP SOLN: 1.0000 [drp] | Freq: Two times a day (BID) | OPHTHALMIC | 0 refills | Status: DC

## 2017-05-06 NOTE — ED Provider Notes (Signed)
[facility]    CSN: [id number] Arrival date & time: 05/06/17  1117     History   Chief Complaint Chief Complaint  Patient presents with  . Eye Pain    HPI Rebecca Marquez is a 56 y.o. female.   56 year old female presents to the urgent care with complaints of blurring vision eye discomfort and a feeling of burning in the eyes this started 2 days ago. Denies any trauma or foreign body sensation. She states that when she wakes up in the morning she has some matting in her eyelashes. She also states she has had some vision changes in that she has to place her face really close to the computer to read it and when driving she is having blurriness of vision far off such as reading signs. This apparently got worse 2 days ago. She was given a prescription for eyeglasses because her visual acuity has been deteriorating over several months but she is yet to get them field. The patient insists that this is due to an eye infection and she had the same thing happened to her several months ago when she was treated with Polytrim and she got better. She has a history of CK D stage IV and hypertension.       Past Medical History:  Diagnosis Date  . Hypertension Dx March 2015  . Kidney disease   . Renal disorder    R/T HTN  . Seasonal allergies     Patient Active Problem List   Diagnosis Date Noted  . Transient vision disturbance of both eyes 09/25/2016  . Poor dentition 02/06/2016  . Glossitis 04/29/2015  . HTN (hypertension) 12/11/2013  . HLD (hyperlipidemia) 11/16/2013  . CKD (chronic kidney disease) stage 4, GFR 15-29 ml/min (HCC) 11/16/2013    Past Surgical History:  Procedure Laterality Date  . TUBAL LIGATION      OB History    No data available       Home Medications    Prior to Admission medications   Medication Sig Start Date End Date Taking? Authorizing Provider  amLODipine (NORVASC) 10 MG tablet Take 1 tablet (10 mg total) by mouth daily. 04/27/17  Yes  Tresa Garter, MD  cloNIDine (CATAPRES) 0.1 MG tablet Take 1 tablet (0.1 mg total) by mouth 3 (three) times daily. 04/27/17  Yes Tresa Garter, MD  cephALEXin (KEFLEX) 500 MG capsule Take 1 capsule (500 mg total) by mouth 4 (four) times daily. 01/20/17   Lysbeth Penner, FNP  ketotifen (ZADITOR) 0.025 % ophthalmic solution Place 1 drop into both eyes 2 (two) times daily. 05/06/17   Janne Napoleon, NP  metroNIDAZOLE (FLAGYL) 500 MG tablet Take 1 tablet (500 mg total) by mouth 2 (two) times daily. 01/20/17   Lysbeth Penner, FNP  phenazopyridine (PYRIDIUM) 200 MG tablet Take 1 tablet (200 mg total) by mouth 3 (three) times daily. 01/20/17   Lysbeth Penner, FNP  trimethoprim-polymyxin b (POLYTRIM) ophthalmic solution Place 1 drop into both eyes every 6 (six) hours. 05/06/17   Janne Napoleon, NP    Family History Family History  Problem Relation Age of Onset  . Diabetes Mother   . Multiple sclerosis Daughter 65    Social History Social History  Substance Use Topics  . Smoking status: Former Smoker    Packs/day: 0.50    Years: 20.00    Types: Cigarettes    Quit date: 08/31/2001  . Smokeless tobacco: Never Used  . Alcohol use Yes  Allergies   Losartan   Review of Systems Review of Systems  Constitutional: Negative.   HENT: Negative.  Negative for congestion and ear pain.   Eyes: Positive for discharge, redness, itching and visual disturbance.  Respiratory: Negative.   Gastrointestinal: Negative.   Skin: Negative.   Neurological: Negative.   Psychiatric/Behavioral: Negative.   All other systems reviewed and are negative.    Physical Exam Triage Vital Signs ED Triage Vitals  Enc Vitals Group     BP 05/06/17 1159 114/82     Pulse Rate 05/06/17 1159 80     Resp 05/06/17 1159 17     Temp 05/06/17 1159 98.4 F (36.9 C)     Temp Source 05/06/17 1159 Oral     SpO2 05/06/17 1159 98 %     Weight --      Height --      Head Circumference --      Peak Flow --       Pain Score 05/06/17 1202 7     Pain Loc --      Pain Edu? --      Excl. in [place]? --    No data found.   Updated Vital Signs BP 114/82   Pulse 80   Temp 98.4 F (36.9 C) (Oral)   Resp 17   SpO2 98%   Visual Acuity Right Eye Distance:   Left Eye Distance:   Bilateral Distance:    Right Eye Near:   Left Eye Near:    Bilateral Near:     Physical Exam  Constitutional: She is oriented to person, place, and time. She appears well-developed and well-nourished. No distress.  HENT:  Head: Normocephalic and atraumatic.  Nose: Nose normal.  Eyes: Pupils are equal, round, and reactive to light. Conjunctivae and EOM are normal. Right eye exhibits no discharge. Left eye exhibits no discharge.  Eye exam reveals clear/white sclera bilaterally, anterior chambers are clear. No conjunctival swelling or erythema. Funduscopic exam without hemorrhages. The optic disc was difficult to see in full 360 in each eye but no evidence seen for papilledema.  Neck: Normal range of motion. Neck supple.  Cardiovascular: Normal rate.   Pulmonary/Chest: Effort normal. No respiratory distress.  Musculoskeletal: She exhibits no edema.  Neurological: She is alert and oriented to person, place, and time. She exhibits normal muscle tone.  Skin: Skin is warm and dry.  Psychiatric: She has a normal mood and affect.  Nursing note and vitals reviewed.    UC Treatments / Results  Labs (all labs ordered are listed, but only abnormal results are displayed) Labs Reviewed - No data to display  EKG  EKG Interpretation None       Radiology No results found.  Procedures Procedures (including critical care time)  Medications Ordered in UC Medications - No data to display   Initial Impression / Assessment and Plan / UC Course  I have reviewed the triage vital signs and the nursing notes.  Pertinent labs & imaging results that were available during my care of the patient were reviewed by me and considered  in my medical decision making (see chart for details).    Use the drops as directed. Try to get your eyeglasses prescription field as soon as possible. Recommend that you see an ophthalmologist or an eye doctor regarding your vision changes. Sometimes acute or quick changes in your vision can be an indicator of a more serious condition and calls more serious problems with vision or even  blindness.  The patient continues with the story that she has had some vision changes for several months yet in the past 2 days the vision has become worse and attributes it to an eye infection. There is no clear evidence of that clinically. In the next day but she states that she has had visual changes for several months, has not obtain her prescription glasses and in part attributes this to her poor vision. She is advised to see an ophthalmologist as soon as possible regarding any acute changes in visual acuity. The patient states that she understands but does not believe there is anything wrong and it is all due to the infection in the eye drops should take care of it.  Final Clinical Impressions(s) / UC Diagnoses   Final diagnoses:  Acute conjunctivitis of both eyes, unspecified acute conjunctivitis type  Vision changes    New Prescriptions New Prescriptions   KETOTIFEN (ZADITOR) 0.025 % OPHTHALMIC SOLUTION    Place 1 drop into both eyes 2 (two) times daily.   TRIMETHOPRIM-POLYMYXIN B (POLYTRIM) OPHTHALMIC SOLUTION    Place 1 drop into both eyes every 6 (six) hours.     Controlled Substance Prescriptions [place] Controlled Substance Registry consulted? Not Applicable   Janne Napoleon, NP 05/06/17 1249

## 2017-05-06 NOTE — ED Triage Notes (Signed)
Pt presents with c/o eye pain. The eye pain began about two days ago. She describes as a constant burning to her bilateral eyes. She reports blurred vision. She tried some old eye drops she was given for conjunctivitis several months ago with no relief. 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

## 2017-05-06 NOTE — Discharge Instructions (Signed)
Use the drops as directed. Try to get your eyeglasses prescription field as soon as possible. Recommend that you see an ophthalmologist or an eye doctor regarding your vision changes. Sometimes acute or quick changes in your vision can be an indicator of a more serious condition and calls more serious problems with vision or even blindness.

## 2017-05-17 ENCOUNTER — Ambulatory Visit: Payer: Managed Care, Other (non HMO) | Admitting: Internal Medicine

## 2017-06-03 ENCOUNTER — Encounter (HOSPITAL_COMMUNITY): Payer: Self-pay | Admitting: Family Medicine

## 2017-06-03 ENCOUNTER — Ambulatory Visit (HOSPITAL_COMMUNITY)
Admission: EM | Admit: 2017-06-03 | Discharge: 2017-06-03 | Disposition: A | Discharge status: Home / Self Care | Payer: No Typology Code available for payment source | Attending: Family Medicine | Admitting: Family Medicine

## 2017-06-03 DIAGNOSIS — K0510 Chronic gingivitis, plaque induced: Secondary | ICD-10-CM | POA: Diagnosis not present

## 2017-06-03 DIAGNOSIS — K529 Noninfective gastroenteritis and colitis, unspecified: Secondary | ICD-10-CM | POA: Diagnosis not present

## 2017-06-03 MED ORDER — ONDANSETRON 8 MG PO TBDP: 8.0000 mg | ORAL_TABLET | Freq: Three times a day (TID) | ORAL | 0 refills | Status: DC | PRN

## 2017-06-03 MED ORDER — PENICILLIN V POTASSIUM 500 MG PO TABS: 500.0000 mg | ORAL_TABLET | Freq: Three times a day (TID) | ORAL | 0 refills | Status: DC

## 2017-06-03 NOTE — ED Triage Notes (Signed)
Pt here for Gi upset x 3 days with N,V and cramping. reports all severe gum pain and sensitivity.

## 2017-06-03 NOTE — Discharge Instructions (Signed)
Stay on clear liquids until the diarrhea and vomiting subside. Then you may add toast and crackers followed by simple foods like scrambled eggs.

## 2017-06-03 NOTE — ED Provider Notes (Signed)
[place]   [id number] 06/03/17 Arrival Time: 1612   SUBJECTIVE:  Rebecca Marquez is a 56 y.o. female who presents to the urgent care with complaint of Pt here for Gi upset x 3 days with N,V and cramping. reports diffuse  gum pain and sensitivity.   Patient has been trying chlorhexidine for her mouth but it burns the gums.   Patient works in a call center  Past Medical History:  Diagnosis Date  . Hypertension Dx March 2015  . Kidney disease   . Renal disorder    R/T HTN  . Seasonal allergies    Family History  Problem Relation Age of Onset  . Diabetes Mother   . Multiple sclerosis Daughter 66   Social History   Social History  . Marital status: Legally Separated    Spouse name: N/A  . Number of children: N/A  . Years of education: N/A   Occupational History  . Not on file.   Social History Main Topics  . Smoking status: Former Smoker    Packs/day: 0.50    Years: 20.00    Types: Cigarettes    Quit date: 08/31/2001  . Smokeless tobacco: Never Used  . Alcohol use Yes  . Drug use: Yes    Types: Marijuana     Comment: last used 04/28/2015  . Sexual activity: Not on file   Other Topics Concern  . Not on file   Social History Narrative  . No narrative on file   No outpatient prescriptions have been marked as taking for the 06/03/17 encounter [facility] Encounter).   Allergies  Allergen Reactions  . Losartan Tinitus      ROS: As per HPI, remainder of ROS negative.   OBJECTIVE:   Vitals:   06/03/17 1654  BP: (!) 129/94  Pulse: 61  Resp: 18  Temp: 98.1 F (36.7 C)  TempSrc: Oral  SpO2: 100%     General appearance: alert; no distress Eyes: PERRL; EOMI; conjunctiva normal HENT: normocephalic; atraumatic; TMs normal, canal normal, external ears normal without trauma; nasal mucosa normal; oral mucosa- Erythematous and mildly swollen gingiva left upper gums Neck: supple Lungs: clear to auscultation bilaterally Heart: regular rate and  rhythm Abdomen: soft, non-tender; bowel sounds normal; no masses or organomegaly; no guarding or rebound tenderness Back: no CVA tenderness Extremities: no cyanosis or edema; symmetrical with no gross deformities Skin: warm and dry Neurologic: normal gait; grossly normal Psychological: alert and cooperative; normal mood and affect      Labs:  Results for orders placed or performed during the hospital encounter of 01/20/17  Urine culture  Result Value Ref Range   Specimen Description URINE, CLEAN CATCH    Special Requests NONE    Culture <10,000 COLONIES/mL INSIGNIFICANT GROWTH (A)    Report Status 01/21/2017 FINAL   POCT urinalysis dip (device)  Result Value Ref Range   Glucose, UA NEGATIVE NEGATIVE mg/dL   Bilirubin Urine NEGATIVE NEGATIVE   Ketones, ur NEGATIVE NEGATIVE mg/dL   Specific Gravity, Urine 1.020 1.005 - 1.030   Hgb urine dipstick TRACE (A) NEGATIVE   pH 6.0 5.0 - 8.0   Protein, ur 30 (A) NEGATIVE mg/dL   Urobilinogen, UA 0.2 0.0 - 1.0 mg/dL   Nitrite NEGATIVE NEGATIVE   Leukocytes, UA SMALL (A) NEGATIVE  Pregnancy, urine POC  Result Value Ref Range   Preg Test, Ur NEGATIVE NEGATIVE  Cervicovaginal ancillary only  Result Value Ref Range   Bacterial vaginitis **POSITIVE for Gardnerella vaginalis** (A)  Candida vaginitis Negative for Candida species    Chlamydia Negative    Neisseria gonorrhea Negative    Trichomonas **POSITIVE** (A)     Labs Reviewed - No data to display  No results found.     ASSESSMENT & PLAN:  No diagnosis found.  No orders of the defined types were placed in this encounter.   Reviewed expectations re: course of current medical issues. Questions answered. Outlined signs and symptoms indicating need for more acute intervention. Patient verbalized understanding. After Visit Summary given.    Procedures:      Robyn Haber, MD 06/03/17 1003

## 2017-06-15 ENCOUNTER — Telehealth: Payer: Self-pay

## 2017-06-15 DIAGNOSIS — I150 Renovascular hypertension: Secondary | ICD-10-CM

## 2017-06-15 MED ORDER — CLONIDINE HCL 0.1 MG PO TABS: 0.1000 mg | ORAL_TABLET | Freq: Three times a day (TID) | ORAL | 0 refills | Status: DC

## 2017-06-15 MED ORDER — AMLODIPINE BESYLATE 10 MG PO TABS: 10.0000 mg | ORAL_TABLET | Freq: Every day | ORAL | 0 refills | Status: DC

## 2017-06-15 NOTE — Telephone Encounter (Signed)
Refilled x 30 days. 

## 2017-06-15 NOTE — Telephone Encounter (Signed)
Pt contacted the office and is requesting her bp medicine be sent to [facility] on [address]. I informaed pt that she will need to schedule and appointment in order to get future refills pt has an appointment schedule for 11-20@ [phone] with Dr. Wynetta Emery

## 2017-07-12 ENCOUNTER — Telehealth: Payer: Self-pay | Admitting: Internal Medicine

## 2017-07-12 DIAGNOSIS — I150 Renovascular hypertension: Secondary | ICD-10-CM

## 2017-07-12 MED ORDER — CLONIDINE HCL 0.1 MG PO TABS: 0.1000 mg | ORAL_TABLET | Freq: Three times a day (TID) | ORAL | 0 refills | Status: DC

## 2017-07-12 NOTE — Telephone Encounter (Signed)
Pt called to request a refill for cloNIDine (CATAPRES) 0.1 MG tablet  Please sent to [facility] on [address], please follow up

## 2017-07-12 NOTE — Addendum Note (Signed)
Addended by: Karle Plumber B on: 07/12/2017 08:50 PM   Modules accepted: Orders

## 2017-07-12 NOTE — Telephone Encounter (Signed)
Patient called asking for clonidine to be refilled and sent to [facility] on [address] please fu

## 2017-07-20 ENCOUNTER — Encounter: Payer: Self-pay | Admitting: Internal Medicine

## 2017-07-20 ENCOUNTER — Ambulatory Visit: Payer: No Typology Code available for payment source | Attending: Internal Medicine | Admitting: Internal Medicine

## 2017-07-20 VITALS — BP 104/67 | HR 69 | Temp 98.3°F | Resp 18 | Ht 63.5 in | Wt 120.0 lb

## 2017-07-20 DIAGNOSIS — Z87891 Personal history of nicotine dependence: Secondary | ICD-10-CM | POA: Diagnosis not present

## 2017-07-20 DIAGNOSIS — R634 Abnormal weight loss: Secondary | ICD-10-CM | POA: Diagnosis not present

## 2017-07-20 DIAGNOSIS — E785 Hyperlipidemia, unspecified: Secondary | ICD-10-CM | POA: Insufficient documentation

## 2017-07-20 DIAGNOSIS — Z1231 Encounter for screening mammogram for malignant neoplasm of breast: Secondary | ICD-10-CM | POA: Diagnosis not present

## 2017-07-20 DIAGNOSIS — Z1239 Encounter for other screening for malignant neoplasm of breast: Secondary | ICD-10-CM

## 2017-07-20 DIAGNOSIS — I10 Essential (primary) hypertension: Secondary | ICD-10-CM

## 2017-07-20 DIAGNOSIS — Z2821 Immunization not carried out because of patient refusal: Secondary | ICD-10-CM

## 2017-07-20 DIAGNOSIS — Z79899 Other long term (current) drug therapy: Secondary | ICD-10-CM | POA: Insufficient documentation

## 2017-07-20 DIAGNOSIS — Z1211 Encounter for screening for malignant neoplasm of colon: Secondary | ICD-10-CM | POA: Diagnosis not present

## 2017-07-20 DIAGNOSIS — K921 Melena: Secondary | ICD-10-CM | POA: Insufficient documentation

## 2017-07-20 DIAGNOSIS — R110 Nausea: Secondary | ICD-10-CM | POA: Insufficient documentation

## 2017-07-20 DIAGNOSIS — N184 Chronic kidney disease, stage 4 (severe): Secondary | ICD-10-CM

## 2017-07-20 DIAGNOSIS — I129 Hypertensive chronic kidney disease with stage 1 through stage 4 chronic kidney disease, or unspecified chronic kidney disease: Secondary | ICD-10-CM | POA: Diagnosis not present

## 2017-07-20 DIAGNOSIS — Z760 Encounter for issue of repeat prescription: Secondary | ICD-10-CM | POA: Diagnosis present

## 2017-07-20 NOTE — Progress Notes (Signed)
Patient ID: Rebecca Marquez, female    DOB: 06-Jul-1961  MRN: [id number]  CC: Hypertension and Medication Refill   Subjective: Rebecca Marquez is a 56 y.o. female who presents for chronic ds management. Last Saw [facility] 08/2016 Her concerns today include:  Pt with HTN, CKD4   1. HTN:  Compliant with clonidine and amlodipine and salt restriction -checks BP occasionally -no CP/SOB/LE edema  2. CKD stage 4: -making good urine Avoids NSAID Has never seen a nephrologist  3. C/o nausea every morning x 1 yr. Given Zofran in past which heelps  Goes away after she eats breakfast Hx of PUD yrs ago Occasional heart burn  4. Loss 7 lbs since 08/2016 -appetite down No hot or cold intolerance, no palpitations littled stressed about living with parents and hoping to get her own place soon.  -denies depress mood -moving bowels okay. Occasion blood in the stools which she attributes to hemorrhoids  HM: Due for mammogram, colon cancer screening and flu shot Patient Active Problem List   Diagnosis Date Noted  . Transient vision disturbance of both eyes 09/25/2016  . Poor dentition 02/06/2016  . Glossitis 04/29/2015  . HTN (hypertension) 12/11/2013  . HLD (hyperlipidemia) 11/16/2013  . CKD (chronic kidney disease) stage 4, GFR 15-29 ml/min (HCC) 11/16/2013     Current Outpatient Medications on File Prior to Visit  Medication Sig Dispense Refill  . amLODipine (NORVASC) 10 MG tablet Take 1 tablet (10 mg total) by mouth daily. 30 tablet 0  . cloNIDine (CATAPRES) 0.1 MG tablet Take 1 tablet (0.1 mg total) 3 (three) times daily by mouth. 90 tablet 0  . ketotifen (ZADITOR) 0.025 % ophthalmic solution Place 1 drop into both eyes 2 (two) times daily. 5 mL 0  . ondansetron (ZOFRAN-ODT) 8 MG disintegrating tablet Take 1 tablet (8 mg total) by mouth every 8 (eight) hours as needed for nausea. 12 tablet 0  . penicillin v potassium (VEETID) 500 MG tablet Take 1 tablet (500 mg total) by mouth 3 (three) times  daily. 30 tablet 0   No current facility-administered medications on file prior to visit.     Allergies  Allergen Reactions  . Losartan Tinitus    Social History   Socioeconomic History  . Marital status: Legally Separated    Spouse name: Not on file  . Number of children: Not on file  . Years of education: Not on file  . Highest education level: Not on file  Social Needs  . Financial resource strain: Not on file  . Food insecurity - worry: Not on file  . Food insecurity - inability: Not on file  . Transportation needs - medical: Not on file  . Transportation needs - non-medical: Not on file  Occupational History  . Not on file  Tobacco Use  . Smoking status: Former Smoker    Packs/day: 0.50    Years: 20.00    Pack years: 10.00    Types: Cigarettes    Last attempt to quit: 08/31/2001    Years since quitting: 15.8  . Smokeless tobacco: Never Used  Substance and Sexual Activity  . Alcohol use: Yes  . Drug use: Yes    Types: Marijuana    Comment: last used 04/28/2015  . Sexual activity: Not on file  Other Topics Concern  . Not on file  Social History Narrative  . Not on file    Family History  Problem Relation Age of Onset  . Diabetes Mother   .  Multiple sclerosis Daughter 50    Past Surgical History:  Procedure Laterality Date  . TUBAL LIGATION      ROS: Review of Systems Negative except as stated above PHYSICAL EXAM: BP 104/67 (BP Location: Left Arm, Patient Position: Sitting, Cuff Size: Normal)   Pulse 69   Temp 98.3 F (36.8 C) (Oral)   Resp 18   Ht 5' 3.5" (1.613 m)   Wt 120 lb (54.4 kg)   SpO2 100%   BMI 20.92 kg/m   Wt Readings from Last 3 Encounters:  07/20/17 120 lb (54.4 kg)  01/19/17 123 lb (55.8 kg)  09/25/16 127 lb 12.8 oz (58 kg)   Physical Exam  General appearance - alert, well appearing, middle age AAF and in no distress Mental status - alert, oriented to person, place, and time, normal mood, behavior, speech, dress, motor  activity, and thought processes Mouth - mucous membranes moist, pharynx normal without lesions Neck - supple, no significant adenopathy Chest - clear to auscultation, no wheezes, rales or rhonchi, symmetric air entry Heart - normal rate, regular rhythm, normal S1, S2, no murmurs, rubs, clicks or gallops Extremities - peripheral pulses normal, no pedal edema, no clubbing or cyanosis  Lab Results  Component Value Date   WBC 5.0 01/19/2017   HGB 13.9 01/19/2017   HCT 41.6 01/19/2017   MCV 94.3 01/19/2017   PLT 228 01/19/2017     Chemistry      Component Value Date/Time   NA 138 01/19/2017 2118   K 4.2 01/19/2017 2118   CL 108 01/19/2017 2118   CO2 24 01/19/2017 2118   BUN 28 (H) 01/19/2017 2118   CREATININE 2.37 (H) 01/19/2017 2118   CREATININE 2.18 (H) 09/25/2016 0934      Component Value Date/Time   CALCIUM 8.9 01/19/2017 2118   ALKPHOS 116 09/25/2016 0934   AST 21 09/25/2016 0934   ALT 13 09/25/2016 0934   BILITOT 0.4 09/25/2016 0934      ASSESSMENT AND PLAN: 1. Essential hypertension -at goal. Continue Norvasc and Clonidine - CBC - Comprehensive metabolic panel  2. CKD (chronic kidney disease) stage 4, GFR 15-29 ml/min (HCC) - Ambulatory referral to Nephrology - Comprehensive metabolic panel -advise not to use NSAIDS. Keep BP under good control  3. Influenza vaccination declined  4. Nausea - Ambulatory referral to Gastroenterology  5. Colon cancer screening - Ambulatory referral to Gastroenterology  6. Unexplained weight loss - TSH -recommend smaller more frequent meals for decrease appetite Referred to GI for colon CA screening 7. Breast cancer screening - MM Digital Screening; Future  Patient was given the opportunity to ask questions.  Patient verbalized understanding of the plan and was able to repeat key elements of the plan.   Orders Placed This Encounter  Procedures  . MM Digital Screening  . CBC  . Comprehensive metabolic panel  . TSH  .  Ambulatory referral to Gastroenterology  . Ambulatory referral to Nephrology     Requested Prescriptions    No prescriptions requested or ordered in this encounter    Return in about 3 months (around 10/20/2017).  Karle Plumber, MD, FACP

## 2017-07-20 NOTE — Patient Instructions (Signed)
Try eating smaller more frequent meals. You can also supplement meals with Boost or Ensure shakes.

## 2017-07-21 LAB — COMPREHENSIVE METABOLIC PANEL
ALK PHOS: 118 IU/L — AB (ref 39–117)
ALT: 13 IU/L (ref 0–32)
AST: 20 IU/L (ref 0–40)
Albumin/Globulin Ratio: 1.1 — ABNORMAL LOW (ref 1.2–2.2)
Albumin: 3.6 g/dL (ref 3.5–5.5)
BUN / CREAT RATIO: 13 (ref 9–23)
BUN: 23 mg/dL (ref 6–24)
Bilirubin Total: <0.2 mg/dL (ref 0.0–1.2)
CALCIUM: 8.9 mg/dL (ref 8.7–10.2)
CO2: 24 mmol/L (ref 20–29)
CREATININE: 1.76 mg/dL — AB (ref 0.57–1.00)
Chloride: 106 mmol/L (ref 96–106)
GFR calc Af Amer: 37 mL/min/{1.73_m2} — ABNORMAL LOW (ref >59)
GFR, EST NON AFRICAN AMERICAN: 32 mL/min/{1.73_m2} — AB (ref >59)
GLOBULIN, TOTAL: 3.4 g/dL (ref 1.5–4.5)
GLUCOSE: 92 mg/dL (ref 65–99)
POTASSIUM: 4.5 mmol/L (ref 3.5–5.2)
SODIUM: 142 mmol/L (ref 134–144)
Total Protein: 7 g/dL (ref 6.0–8.5)

## 2017-07-21 LAB — TSH: TSH: 0.745 u[IU]/mL (ref 0.450–4.500)

## 2017-07-21 LAB — CBC
HEMATOCRIT: 38.8 % (ref 34.0–46.6)
HEMOGLOBIN: 13 g/dL (ref 11.1–15.9)
MCH: 32.1 pg (ref 26.6–33.0)
MCHC: 33.5 g/dL (ref 31.5–35.7)
MCV: 96 fL (ref 79–97)
Platelets: 253 10*3/uL (ref 150–379)
RBC: 4.05 x10E6/uL (ref 3.77–5.28)
RDW: 14.8 % (ref 12.3–15.4)
WBC: 4.9 10*3/uL (ref 3.4–10.8)

## 2017-08-02 ENCOUNTER — Telehealth: Payer: Self-pay | Admitting: Internal Medicine

## 2017-08-02 NOTE — Telephone Encounter (Signed)
Pt. Called regarding her amLODipine (NORVASC) 10 MG tablet Pt. States that she had heard there was a recall on the medication.  Spoke with Carilyn Goodpasture and she stated that per the pharmacy it is only a combination of Amlodipine and Valsartan. Pt. Only takes Amlodipine.

## 2017-08-18 ENCOUNTER — Ambulatory Visit: Payer: No Typology Code available for payment source

## 2017-09-02 ENCOUNTER — Ambulatory Visit (HOSPITAL_COMMUNITY)
Admission: EM | Admit: 2017-09-02 | Discharge: 2017-09-02 | Disposition: A | Discharge status: Home / Self Care | Payer: No Typology Code available for payment source | Attending: Physician Assistant | Admitting: Physician Assistant

## 2017-09-02 ENCOUNTER — Encounter (HOSPITAL_COMMUNITY): Payer: Self-pay | Admitting: Family Medicine

## 2017-09-02 DIAGNOSIS — E860 Dehydration: Secondary | ICD-10-CM | POA: Diagnosis not present

## 2017-09-02 DIAGNOSIS — N179 Acute kidney failure, unspecified: Secondary | ICD-10-CM | POA: Diagnosis not present

## 2017-09-02 DIAGNOSIS — N189 Chronic kidney disease, unspecified: Secondary | ICD-10-CM

## 2017-09-02 DIAGNOSIS — R112 Nausea with vomiting, unspecified: Secondary | ICD-10-CM

## 2017-09-02 LAB — POCT I-STAT, CHEM 8
BUN: 32 mg/dL — ABNORMAL HIGH (ref 6–20)
Calcium, Ion: 1.09 mmol/L — ABNORMAL LOW (ref 1.15–1.40)
Chloride: 112 mmol/L — ABNORMAL HIGH (ref 101–111)
Creatinine, Ser: 2 mg/dL — ABNORMAL HIGH (ref 0.44–1.00)
GLUCOSE: 91 mg/dL (ref 65–99)
HEMATOCRIT: 43 % (ref 36.0–46.0)
Hemoglobin: 14.6 g/dL (ref 12.0–15.0)
POTASSIUM: 4.3 mmol/L (ref 3.5–5.1)
Sodium: 142 mmol/L (ref 135–145)
TCO2: 21 mmol/L — ABNORMAL LOW (ref 22–32)

## 2017-09-02 MED ORDER — ONDANSETRON 4 MG PO TBDP
4.0000 mg | ORAL_TABLET | Freq: Once | ORAL | Status: AC
  Administered 2017-09-02: 4 mg via ORAL

## 2017-09-02 MED ORDER — ONDANSETRON 4 MG PO TBDP
ORAL_TABLET | ORAL | Status: AC
  Filled 2017-09-02: qty 1

## 2017-09-02 NOTE — ED Triage Notes (Signed)
Pt here for N,V that started today. Denies any abd pain.

## 2017-09-02 NOTE — ED Notes (Signed)
Encouraged pt to stay to get some IV hydration.  Pt refused.  Stated she will go home and drink fluids.  Stressed to her to drink water and no caffeine and if she is experiencing worsening symptoms, dizziness, LOC, confusion, etc. She needs to call 911.  Pt stated understanding.  Pt was A&O upon departure.

## 2017-09-02 NOTE — Discharge Instructions (Signed)
Please go to the emergency department if you are not getting better.

## 2017-09-02 NOTE — ED Provider Notes (Addendum)
09/02/2017 3:57 PM   DOB: June 12, 1961 / MRN: [id number]  SUBJECTIVE:  Rebecca Marquez is a 57 y.o. fatigued appearing female presenting for nausea and emesis that started this morning.  Denies focal abdominal pain. Denies a history of abdominal surgery but then tells me she has had "tubes burnt" in reference to her fallopian tubes. She denis diarrhea and last stool was yesterday and normal for her.  Denies fever and chills.   She is allergic to losartan.   She  has a past medical history of Hypertension (Dx March 2015), Kidney disease, Renal disorder, and Seasonal allergies.    She  reports that she quit smoking about 16 years ago. Her smoking use included cigarettes. She has a 10.00 pack-year smoking history. she has never used smokeless tobacco. She reports that she drinks alcohol. She reports that she uses drugs. Drug: Marijuana. She  has no sexual activity history on file. The patient  has a past surgical history that includes Tubal ligation.  Her family history includes Diabetes in her mother; Multiple sclerosis (age of onset: 35) in her daughter.  Review of Systems  Constitutional: Negative for chills and fever.  Gastrointestinal: Positive for nausea and vomiting. Negative for abdominal pain, blood in stool, constipation, diarrhea, heartburn and melena.  Genitourinary: Negative for dysuria.  Skin: Negative for itching and rash.  Neurological: Negative for dizziness.    OBJECTIVE:  BP 101/79   Pulse 64   Temp 98.4 F (36.9 C)   Resp 18   SpO2 100%   BP Readings from Last 3 Encounters:  09/02/17 101/79  07/20/17 104/67  06/03/17 (!) 129/94   Lab Results  Component Value Date   CREATININE 2.00 (H) 09/02/2017   CREATININE 1.76 (H) 07/20/2017   CREATININE 2.37 (H) 01/19/2017   Physical Exam  Constitutional: She is active.  Non-toxic appearance.  Cardiovascular: Normal rate, regular rhythm, S1 normal, S2 normal, normal heart sounds and intact distal pulses. Exam reveals no gallop,  no friction rub and no decreased pulses.  No murmur heard. Pulmonary/Chest: Effort normal. No stridor. No tachypnea. No respiratory distress. She has no wheezes. She has no rales.  Abdominal: Soft. Bowel sounds are normal. She exhibits no distension and no mass. There is no tenderness. There is no rigidity, no rebound, no guarding, no CVA tenderness, no tenderness at McBurney's point and negative Murphy's sign.  Musculoskeletal: She exhibits no edema.  Neurological: She is alert.  Skin: Skin is warm and dry. She is not diaphoretic. No pallor.    Results for orders placed or performed during the hospital encounter of 09/02/17 (from the past 72 hour(s))  I-STAT, chem 8     Status: Abnormal   Collection Time: 09/02/17  3:35 PM  Result Value Ref Range   Sodium 142 135 - 145 mmol/L   Potassium 4.3 3.5 - 5.1 mmol/L   Chloride 112 (H) 101 - 111 mmol/L   BUN 32 (H) 6 - 20 mg/dL   Creatinine, Ser 2.00 (H) 0.44 - 1.00 mg/dL   Glucose, Bld 91 65 - 99 mg/dL   Calcium, Ion 1.09 (L) 1.15 - 1.40 mmol/L   TCO2 21 (L) 22 - 32 mmol/L   Hemoglobin 14.6 12.0 - 15.0 g/dL   HCT 43.0 36.0 - 46.0 %    No results found.  ASSESSMENT AND PLAN:  The primary encounter diagnosis was AKI (acute kidney injury) ([place]). Diagnoses of Chronic kidney disease, unspecified CKD stage and Dehydration were also pertinent to this visit. I have  offered fluids however she refuses this. She needs a work note and says "I will just go drink water."  Advised that if she is not improving to come back or go to the ED for hydration.      The patient is advised to call or return to clinic if she does not see an improvement in symptoms, or to seek the care of the closest emergency department if she worsens with the above plan.   Philis Fendt, MHS, PA-C 09/02/2017 3:57 PM    Tereasa Coop, PA-C 09/02/17 1555    Tereasa Coop, PA-C 09/02/17 1557

## 2017-10-19 ENCOUNTER — Ambulatory Visit: Payer: No Typology Code available for payment source | Admitting: Internal Medicine

## 2017-10-20 ENCOUNTER — Telehealth: Payer: Self-pay | Admitting: Internal Medicine

## 2017-10-20 DIAGNOSIS — I150 Renovascular hypertension: Secondary | ICD-10-CM

## 2017-10-20 MED ORDER — AMLODIPINE BESYLATE 10 MG PO TABS: 10.0000 mg | ORAL_TABLET | Freq: Every day | ORAL | 0 refills | Status: DC

## 2017-10-20 MED ORDER — CLONIDINE HCL 0.1 MG PO TABS: 0.1000 mg | ORAL_TABLET | Freq: Three times a day (TID) | ORAL | 0 refills | Status: DC

## 2017-10-20 NOTE — Telephone Encounter (Signed)
Pt called to get a refill for cloNIDine (CATAPRES) 0.1 MG tablet amLODipine (NORVASC) 10 MG tablet  Please sent it to  [place], [place] - [address] DEA #:  --   Please follow up

## 2017-10-20 NOTE — Telephone Encounter (Signed)
Refilled x 30 days, due for office visit.

## 2017-11-23 ENCOUNTER — Other Ambulatory Visit: Payer: Self-pay | Admitting: Internal Medicine

## 2017-11-23 DIAGNOSIS — I150 Renovascular hypertension: Secondary | ICD-10-CM

## 2017-11-24 ENCOUNTER — Other Ambulatory Visit: Payer: Self-pay

## 2017-11-24 ENCOUNTER — Telehealth: Payer: Self-pay | Admitting: Internal Medicine

## 2017-11-24 ENCOUNTER — Ambulatory Visit (HOSPITAL_COMMUNITY)
Admission: EM | Admit: 2017-11-24 | Discharge: 2017-11-24 | Disposition: A | Discharge status: Home / Self Care | Payer: No Typology Code available for payment source | Attending: Family Medicine | Admitting: Family Medicine

## 2017-11-24 ENCOUNTER — Other Ambulatory Visit: Payer: Self-pay | Admitting: Internal Medicine

## 2017-11-24 ENCOUNTER — Encounter (HOSPITAL_COMMUNITY): Payer: Self-pay | Admitting: Emergency Medicine

## 2017-11-24 DIAGNOSIS — I10 Essential (primary) hypertension: Secondary | ICD-10-CM

## 2017-11-24 DIAGNOSIS — I150 Renovascular hypertension: Secondary | ICD-10-CM

## 2017-11-24 MED ORDER — CLONIDINE HCL 0.1 MG PO TABS: 0.1000 mg | ORAL_TABLET | Freq: Three times a day (TID) | ORAL | 1 refills | Status: DC

## 2017-11-24 MED ORDER — AMLODIPINE BESYLATE 10 MG PO TABS: 10.0000 mg | ORAL_TABLET | Freq: Every day | ORAL | 1 refills | Status: DC

## 2017-11-24 NOTE — Telephone Encounter (Signed)
Pt recieveed a 30 day supply with no refills back on 10-20-2017. Per last phone note pt is due for an office visit. Please schedule pt an appointment before refill granted

## 2017-11-24 NOTE — ED Triage Notes (Signed)
Patient has a new pcp and is out of medication.   Patient has taken one clonidine today.

## 2017-11-24 NOTE — Telephone Encounter (Signed)
Patient called and was mad because her medications was not filled on that same day, she used profanity when referring to her Nurse her doctor and the facility.she did not want her next appointment which was scheduled for the next day and stated she never wants to be seen here again.I left her scheduled appt just in case she comes in.

## 2017-11-24 NOTE — Telephone Encounter (Signed)
Pt called to request her medication refilled -cloNIDine (CATAPRES) 0.1 MG tablet  -amLODipine (NORVASC) 10 MG tablet  To  -[place], [place] - [address] Please follow up (Pt denied an office visit)

## 2017-11-24 NOTE — ED Provider Notes (Signed)
[facility]   [id number] 11/24/17 Arrival Time: 4098  ASSESSMENT & PLAN:  1. Essential hypertension   2. Renovascular hypertension    Medication refills requested and filled:  Meds ordered this encounter  Medications  . amLODipine (NORVASC) 10 MG tablet    Sig: Take 1 tablet (10 mg total) by mouth daily.    Dispense:  30 tablet    Refill:  1    Must have office visit for refills  . cloNIDine (CATAPRES) 0.1 MG tablet    Sig: Take 1 tablet (0.1 mg total) by mouth 3 (three) times daily.    Dispense:  90 tablet    Refill:  1    Must have office visit for refills   Will schedule f/u with her PCP. Reviewed expectations re: course of current medical issues. Questions answered. Outlined signs and symptoms indicating need for more acute intervention. Patient verbalized understanding. After Visit Summary given.   SUBJECTIVE: History from: patient. Rebecca Marquez is a 57 y.o. female who presents requesting medication refill. No current concerns.  Current medical problems include:  Current Outpatient Medications:  .  amLODipine (NORVASC) 10 MG tablet, Take 1 tablet (10 mg total) by mouth daily., Disp: 30 tablet, Rfl: 1 .  cloNIDine (CATAPRES) 0.1 MG tablet, Take 1 tablet (0.1 mg total) by mouth 3 (three) times daily., Disp: 90 tablet, Rfl: 1 .  ketotifen (ZADITOR) 0.025 % ophthalmic solution, Place 1 drop into both eyes 2 (two) times daily., Disp: 5 mL, Rfl: 0 .  ondansetron (ZOFRAN-ODT) 8 MG disintegrating tablet, Take 1 tablet (8 mg total) by mouth every 8 (eight) hours as needed for nausea., Disp: 12 tablet, Rfl: 0  ROS: As per HPI.   OBJECTIVE:  Vitals:   11/24/17 1824  BP: (!) 127/91  Pulse: 78  Resp: 18  Temp: 98.4 F (36.9 C)  TempSrc: Oral  SpO2: 100%    General appearance: alert; no distress Lungs: clear to auscultation bilaterally Heart: regular rate and rhythm Abdomen: soft, non-tender Back: no CVA tenderness Extremities: no edema; symmetrical  with no gross deformities Skin: warm and dry Psychological: alert and cooperative; normal mood and affect   Allergies  Allergen Reactions  . Losartan Tinitus    Past Medical History:  Diagnosis Date  . Hypertension Dx March 2015  . Kidney disease   . Renal disorder    R/T HTN  . Seasonal allergies    Social History   Socioeconomic History  . Marital status: Legally Separated    Spouse name: Not on file  . Number of children: Not on file  . Years of education: Not on file  . Highest education level: Not on file  Occupational History  . Not on file  Social Needs  . Financial resource strain: Not on file  . Food insecurity:    Worry: Not on file    Inability: Not on file  . Transportation needs:    Medical: Not on file    Non-medical: Not on file  Tobacco Use  . Smoking status: Former Smoker    Packs/day: 0.50    Years: 20.00    Pack years: 10.00    Types: Cigarettes    Last attempt to quit: 08/31/2001    Years since quitting: 16.2  . Smokeless tobacco: Never Used  Substance and Sexual Activity  . Alcohol use: Yes  . Drug use: Yes    Types: Marijuana    Comment: last used 04/28/2015  . Sexual activity: Not on  file  Lifestyle  . Physical activity:    Days per week: Not on file    Minutes per session: Not on file  . Stress: Not on file  Relationships  . Social connections:    Talks on phone: Not on file    Gets together: Not on file    Attends religious service: Not on file    Active member of club or organization: Not on file    Attends meetings of clubs or organizations: Not on file    Relationship status: Not on file  . Intimate partner violence:    Fear of current or ex partner: Not on file    Emotionally abused: Not on file    Physically abused: Not on file    Forced sexual activity: Not on file  Other Topics Concern  . Not on file  Social History Narrative  . Not on file   Family History  Problem Relation Age of Onset  . Diabetes Mother   .  Multiple sclerosis Daughter 95   Past Surgical History:  Procedure Laterality Date  . TUBAL LIGATION       Vanessa Kick, MD 11/27/17 1143

## 2017-11-25 ENCOUNTER — Ambulatory Visit: Payer: No Typology Code available for payment source

## 2017-12-03 ENCOUNTER — Emergency Department (HOSPITAL_COMMUNITY)
Admission: EM | Admit: 2017-12-03 | Discharge: 2017-12-03 | Disposition: A | Discharge status: Home / Self Care | Payer: Self-pay | Attending: Emergency Medicine | Admitting: Emergency Medicine

## 2017-12-03 ENCOUNTER — Other Ambulatory Visit: Payer: Self-pay

## 2017-12-03 ENCOUNTER — Encounter (HOSPITAL_COMMUNITY): Payer: Self-pay | Admitting: Emergency Medicine

## 2017-12-03 DIAGNOSIS — I129 Hypertensive chronic kidney disease with stage 1 through stage 4 chronic kidney disease, or unspecified chronic kidney disease: Secondary | ICD-10-CM | POA: Insufficient documentation

## 2017-12-03 DIAGNOSIS — L03012 Cellulitis of left finger: Secondary | ICD-10-CM

## 2017-12-03 DIAGNOSIS — Z87891 Personal history of nicotine dependence: Secondary | ICD-10-CM | POA: Insufficient documentation

## 2017-12-03 DIAGNOSIS — N184 Chronic kidney disease, stage 4 (severe): Secondary | ICD-10-CM | POA: Insufficient documentation

## 2017-12-03 NOTE — ED Triage Notes (Signed)
Pt states her left ring finger has been hurting since she had nails done. She has acrylic nails. She took the top acrylic off. States it has been swelling and is very painful. No discharge. Denies fevers

## 2017-12-03 NOTE — ED Provider Notes (Signed)
[facility] EMERGENCY DEPARTMENT Provider Note   CSN: [id number] Arrival date & time: 12/03/17  1646     History   Chief Complaint Chief Complaint  Patient presents with  . Finger Injury    HPI Rebecca Marquez is a 57 y.o. female presenting to the ED with complaints of left 4th fingertip pain. Pt states she began having pain a few days ago after getting acrylic nails placed. She states the pain is at the fingertip and is worse with palpation. Applied hydrogen peroxide without relief. Denies injuries, fever, purulent drainage.   The history is provided by the patient.    Past Medical History:  Diagnosis Date  . Hypertension Dx March 2015  . Kidney disease   . Renal disorder    R/T HTN  . Seasonal allergies     Patient Active Problem List   Diagnosis Date Noted  . Transient vision disturbance of both eyes 09/25/2016  . Poor dentition 02/06/2016  . Glossitis 04/29/2015  . HTN (hypertension) 12/11/2013  . HLD (hyperlipidemia) 11/16/2013  . CKD (chronic kidney disease) stage 4, GFR 15-29 ml/min (HCC) 11/16/2013    Past Surgical History:  Procedure Laterality Date  . TUBAL LIGATION       OB History   None      Home Medications    Prior to Admission medications   Medication Sig Start Date End Date Taking? Authorizing Provider  amLODipine (NORVASC) 10 MG tablet Take 1 tablet (10 mg total) by mouth daily. 11/24/17   Vanessa Kick, MD  cloNIDine (CATAPRES) 0.1 MG tablet Take 1 tablet (0.1 mg total) by mouth 3 (three) times daily. 11/24/17   Vanessa Kick, MD  ketotifen (ZADITOR) 0.025 % ophthalmic solution Place 1 drop into both eyes 2 (two) times daily. 05/06/17   Janne Napoleon, NP  ondansetron (ZOFRAN-ODT) 8 MG disintegrating tablet Take 1 tablet (8 mg total) by mouth every 8 (eight) hours as needed for nausea. 06/03/17   Robyn Haber, MD    Family History Family History  Problem Relation Age of Onset  . Diabetes Mother   . Multiple sclerosis  Daughter 67    Social History Social History   Tobacco Use  . Smoking status: Former Smoker    Packs/day: 0.50    Years: 20.00    Pack years: 10.00    Types: Cigarettes    Last attempt to quit: 08/31/2001    Years since quitting: 16.2  . Smokeless tobacco: Never Used  Substance Use Topics  . Alcohol use: Yes  . Drug use: Yes    Types: Marijuana    Comment: last used 04/28/2015     Allergies   Losartan   Review of Systems Review of Systems  Constitutional: Negative for fever.  Musculoskeletal: Positive for myalgias.  Skin: Positive for color change.     Physical Exam Updated Vital Signs BP (!) 152/80 (BP Location: Right Arm)   Pulse 67   Temp 97.8 F (36.6 C) (Oral)   Resp 15   Ht 5\' 3"  (1.6 m)   Wt 55.3 kg (122 lb)   SpO2 100%   BMI 21.61 kg/m   Physical Exam  Constitutional: She appears well-developed and well-nourished. No distress.  HENT:  Head: Normocephalic and atraumatic.  Eyes: Conjunctivae are normal.  Cardiovascular: Intact distal pulses.  Pulmonary/Chest: Effort normal.  Musculoskeletal:  Left 4th digit with tenderness at fingertip. Mild erythema and swelling. No fluctuance abscess. Finger pad is soft. No wound or drainage.  Psychiatric:  She has a normal mood and affect. Her behavior is normal.  Nursing note and vitals reviewed.    ED Treatments / Results  Labs (all labs ordered are listed, but only abnormal results are displayed) Labs Reviewed - No data to display  EKG None  Radiology No results found.  Procedures Procedures (including critical care time)  Medications Ordered in ED Medications - No data to display   Initial Impression / Assessment and Plan / ED Course  I have reviewed the triage vital signs and the nursing notes.  Pertinent labs & imaging results that were available during my care of the patient were reviewed by me and considered in my medical decision making (see chart for details).     Pt with pain to  left 4th fingertip, may be developing abscess however no drainage fluid on exam. No evidence of felon on exam, finger pad is soft and nontender. Discussed warm compresses and OTC tylenol for pain. Recommend pt follow up at urgent care for recheck. Safe for discharge.  Discussed results, findings, treatment and follow up. Patient advised of return precautions. Patient verbalized understanding and agreed with plan.  Final Clinical Impressions(s) / ED Diagnoses   Final diagnoses:  Paronychia of left ring finger    ED Discharge Orders    None       Cristine Daw, [place] N, PA-C 12/03/17 Alfonse Flavors, MD 12/05/17 2126

## 2017-12-03 NOTE — Discharge Instructions (Addendum)
Apply warm compresses to your finger, multiple times per day. You may develop an abscess that may need to be drained, however there is not a pocket of fluid today to drain. You can take tylenol as needed for pain. You can report to urgent care as needed for follow up.

## 2017-12-23 ENCOUNTER — Ambulatory Visit (HOSPITAL_COMMUNITY)
Admission: EM | Admit: 2017-12-23 | Discharge: 2017-12-23 | Disposition: A | Discharge status: Home / Self Care | Payer: Self-pay | Attending: Internal Medicine | Admitting: Internal Medicine

## 2017-12-23 ENCOUNTER — Encounter (HOSPITAL_COMMUNITY): Payer: Self-pay | Admitting: Family Medicine

## 2017-12-23 DIAGNOSIS — L03012 Cellulitis of left finger: Secondary | ICD-10-CM

## 2017-12-23 MED ORDER — DOXYCYCLINE HYCLATE 100 MG PO CAPS: 100.0000 mg | ORAL_CAPSULE | Freq: Two times a day (BID) | ORAL | 0 refills | Status: DC

## 2017-12-23 NOTE — Discharge Instructions (Signed)
Please call tomorrow to hand surgeon as listed, for follow up appointment to further evaluate your finger infection. Continue to soak finger in warm water at least twice a day. Initiate antibiotics. If develop significantly worsening of pain, redness, swelling, tightness to pad of finger please go to Er.

## 2017-12-23 NOTE — ED Provider Notes (Signed)
[facility]    CSN: [id number] Arrival date & time: 12/23/17  1632     History   Chief Complaint Chief Complaint  Patient presents with  . Hand Pain    HPI Rebecca Marquez is a 57 y.o. female.   Rebecca Marquez presents with complaints of left hand ring finger pain which has been ongoing for the past month. Was seen in ER and diagnosed with paronychia with supportive therapies recommended, on 4/5. She states she types a lot at her job which worsens the pain. Pain started a week or so after having acrylic nails placed. No drainage. Denies any previous similar. No known injury or known source of cause of pain. Has been soaking it occasionally. Hx of htn, kidney disease.   ROS per HPI.      Past Medical History:  Diagnosis Date  . Hypertension Dx March 2015  . Kidney disease   . Renal disorder    R/T HTN  . Seasonal allergies     Patient Active Problem List   Diagnosis Date Noted  . Transient vision disturbance of both eyes 09/25/2016  . Poor dentition 02/06/2016  . Glossitis 04/29/2015  . HTN (hypertension) 12/11/2013  . HLD (hyperlipidemia) 11/16/2013  . CKD (chronic kidney disease) stage 4, GFR 15-29 ml/min (HCC) 11/16/2013    Past Surgical History:  Procedure Laterality Date  . TUBAL LIGATION      OB History   None      Home Medications    Prior to Admission medications   Medication Sig Start Date End Date Taking? Authorizing Provider  amLODipine (NORVASC) 10 MG tablet Take 1 tablet (10 mg total) by mouth daily. 11/24/17   Vanessa Kick, MD  cloNIDine (CATAPRES) 0.1 MG tablet Take 1 tablet (0.1 mg total) by mouth 3 (three) times daily. 11/24/17   Vanessa Kick, MD  doxycycline (VIBRAMYCIN) 100 MG capsule Take 1 capsule (100 mg total) by mouth 2 (two) times daily. 12/23/17   Zigmund Gottron, NP  ketotifen (ZADITOR) 0.025 % ophthalmic solution Place 1 drop into both eyes 2 (two) times daily. 05/06/17   Janne Napoleon, NP  ondansetron (ZOFRAN-ODT) 8 MG  disintegrating tablet Take 1 tablet (8 mg total) by mouth every 8 (eight) hours as needed for nausea. 06/03/17   Robyn Haber, MD    Family History Family History  Problem Relation Age of Onset  . Diabetes Mother   . Multiple sclerosis Daughter 90    Social History Social History   Tobacco Use  . Smoking status: Former Smoker    Packs/day: 0.50    Years: 20.00    Pack years: 10.00    Types: Cigarettes    Last attempt to quit: 08/31/2001    Years since quitting: 16.3  . Smokeless tobacco: Never Used  Substance Use Topics  . Alcohol use: Yes  . Drug use: Yes    Types: Marijuana    Comment: last used 04/28/2015     Allergies   Losartan   Review of Systems Review of Systems   Physical Exam Triage Vital Signs ED Triage Vitals  Enc Vitals Group     BP 12/23/17 1704 108/69     Pulse Rate 12/23/17 1704 75     Resp 12/23/17 1704 18     Temp 12/23/17 1704 98.1 F (36.7 C)     Temp src --      SpO2 12/23/17 1704 100 %     Weight --      Height --  Head Circumference --      Peak Flow --      Pain Score 12/23/17 1703 9     Pain Loc --      Pain Edu? --      Excl. in [place]? --    No data found.  Updated Vital Signs BP 108/69   Pulse 75   Temp 98.1 F (36.7 C)   Resp 18   SpO2 100%   Visual Acuity Right Eye Distance:   Left Eye Distance:   Bilateral Distance:    Right Eye Near:   Left Eye Near:    Bilateral Near:     Physical Exam  Constitutional: She is oriented to person, place, and time. She appears well-developed and well-nourished. No distress.  Cardiovascular: Normal rate, regular rhythm and normal heart sounds.  Pulmonary/Chest: Effort normal and breath sounds normal.  Musculoskeletal:       Left hand: She exhibits tenderness.  Left hand ring fingernail with tenderness, mild tenderness at tip of finger; pad of finger with mild swelling but remains soft; without apparent visible or palpable abscess surrounding nail bed; without palpable  fluctuance; tenderness primarily to nail itself and tip of finger  Neurological: She is alert and oriented to person, place, and time.  Skin: Skin is warm and dry.     UC Treatments / Results  Labs (all labs ordered are listed, but only abnormal results are displayed) Labs Reviewed - No data to display  EKG None Radiology No results found.  Procedures Procedures (including critical care time)  Medications Ordered in UC Medications - No data to display   Initial Impression / Assessment and Plan / UC Course  I have reviewed the triage vital signs and the nursing notes.  Pertinent labs & imaging results that were available during my care of the patient were reviewed by me and considered in my medical decision making (see chart for details).     Without obvious area to drain at this time. Without obvious felon at this time. Doxycycline started at this time, encouraged continued soaks in warm water. Encouraged follow up tomorrow am with hand for recheck. Return precautions provided, felon symptoms described to return to be seen or go to ER if had not been able to see hand surgeon. Patient verbalized understanding and agreeable to plan.    Final Clinical Impressions(s) / UC Diagnoses   Final diagnoses:  Paronychia of finger of left hand    ED Discharge Orders        Ordered    doxycycline (VIBRAMYCIN) 100 MG capsule  2 times daily     12/23/17 1729       Controlled Substance Prescriptions [place] Controlled Substance Registry consulted? Not Applicable   Zigmund Gottron, NP 12/23/17 [phone]

## 2017-12-23 NOTE — ED Triage Notes (Signed)
Pt here for left hand pain and finger swelling. sts that she types and its very painful. sts that her finger is turning color.

## 2018-01-10 ENCOUNTER — Encounter (INDEPENDENT_AMBULATORY_CARE_PROVIDER_SITE_OTHER): Payer: Self-pay | Admitting: Orthopaedic Surgery

## 2018-01-10 ENCOUNTER — Ambulatory Visit (INDEPENDENT_AMBULATORY_CARE_PROVIDER_SITE_OTHER): Payer: 59 | Admitting: Orthopaedic Surgery

## 2018-01-10 ENCOUNTER — Ambulatory Visit (INDEPENDENT_AMBULATORY_CARE_PROVIDER_SITE_OTHER): Payer: 59

## 2018-01-10 DIAGNOSIS — M65342 Trigger finger, left ring finger: Secondary | ICD-10-CM | POA: Diagnosis not present

## 2018-01-10 DIAGNOSIS — D1800 Hemangioma unspecified site: Secondary | ICD-10-CM

## 2018-01-10 NOTE — Progress Notes (Signed)
Office Visit Note   Patient: Rebecca Marquez           Date of Birth: 08-08-1961           MRN: [id number] Visit Date: 01/10/2018              Requested by: Ladell Pier, MD [address] [place], [place] [address] PCP: Ladell Pier, MD   Assessment & Plan: Visit Diagnoses:  1. Trigger finger, left ring finger   2. Glomus tumor     Plan: Impression is glomus tumor, we will refer the patient to a hand specialist.  She will follow-up with [place] as needed.  Call with concerns or questions in the meantime.  Follow-Up Instructions: Return if symptoms worsen or fail to improve.   Orders:  Orders Placed This Encounter  Procedures  . XR Finger Ring Left  . Ambulatory referral to Orthopedic Surgery   No orders of the defined types were placed in this encounter.     Procedures: No procedures performed   Clinical Data: No additional findings.   Subjective: Chief Complaint  Patient presents with  . Left Ring Finger - Pain    HPI patient is a pleasant 57 year old female with a past medical history significant for hypertension and stage IV kidney disease who presents to our clinic today with concerns about her left ring finger.  She notes increased pain and swelling which started to occur approximately 1 to 1-1/2 months ago.  This is all to the distal aspect of her finger.  There is no known injury or change in activity.  No bug bite noted.  The pain and swelling has continued to grow.  Over the past few weeks, she has noted a skin growth to the most distal portion of her finger..  No numbness, tingling or burning.  No drainage and no signs of infection  Review of Systems as detailed in HPI.  All others reviewed and are negative.   Objective: Vital Signs: There were no vitals taken for this visit.  Physical Exam well-developed well-nourished female no acute distress.  Alert and oriented x3.  Ortho Exam examination of the left long finger reveals moderate swelling and  tenderness to the entire distal aspect.  No active drainage.  Full range of motion.  There does appear to be a small growth to the most distal aspect.  Specialty Comments:  No specialty comments available.  Imaging: Xr Finger Ring Left  Result Date: 01/10/2018 Erosion to the distal aspect of the long finger    PMFS History: Patient Active Problem List   Diagnosis Date Noted  . 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 Transient vision disturbance of both eyes 09/25/2016  . Poor dentition 02/06/2016  . Glossitis 04/29/2015  . HTN (hypertension) 12/11/2013  . HLD (hyperlipidemia) 11/16/2013  . CKD (chronic kidney disease) stage 4, GFR 15-29 ml/min (HCC) 11/16/2013   Past Medical History:  Diagnosis Date  . Hypertension Dx March 2015  . Kidney disease   . Renal disorder    R/T HTN  . Seasonal allergies     Family History  Problem Relation Age of Onset  . Diabetes Mother   . Multiple sclerosis Daughter 23    Past Surgical History:  Procedure Laterality Date  . TUBAL LIGATION     Social History   Occupational History  . Not on file  Tobacco Use  . Smoking status: Former Smoker    Packs/day: 0.50    Years: 20.00  Pack years: 10.00    Types: Cigarettes    Last attempt to quit: 08/31/2001    Years since quitting: 16.3  . Smokeless tobacco: Never Used  Substance and Sexual Activity  . Alcohol use: Yes  . Drug use: Yes    Types: Marijuana    Comment: last used 04/28/2015  . Sexual activity: Not on file

## 2018-01-11 ENCOUNTER — Telehealth (INDEPENDENT_AMBULATORY_CARE_PROVIDER_SITE_OTHER): Payer: Self-pay

## 2018-01-11 NOTE — Telephone Encounter (Signed)
Beverly with Dr. Levell July office would like patient's last office note faxed to [phone].  Patient has an appt.on Friday 01/14/18.  Cb# is [phone].  Please advise.  Thank you.

## 2018-01-12 NOTE — Telephone Encounter (Signed)
FAXED

## 2018-01-18 ENCOUNTER — Other Ambulatory Visit: Payer: Self-pay | Admitting: Orthopedic Surgery

## 2018-01-18 DIAGNOSIS — R2232 Localized swelling, mass and lump, left upper limb: Secondary | ICD-10-CM

## 2018-01-22 ENCOUNTER — Ambulatory Visit
Admission: RE | Admit: 2018-01-22 | Discharge: 2018-01-22 | Disposition: A | Discharge status: Home / Self Care | Payer: 59 | Source: Ambulatory Visit | Attending: Orthopedic Surgery | Admitting: Orthopedic Surgery

## 2018-01-22 DIAGNOSIS — R2232 Localized swelling, mass and lump, left upper limb: Secondary | ICD-10-CM

## 2018-01-27 ENCOUNTER — Encounter (HOSPITAL_COMMUNITY): Payer: Self-pay | Admitting: Family Medicine

## 2018-01-27 ENCOUNTER — Ambulatory Visit (HOSPITAL_COMMUNITY)
Admission: EM | Admit: 2018-01-27 | Discharge: 2018-01-27 | Disposition: A | Discharge status: Home / Self Care | Payer: 59 | Attending: Family Medicine | Admitting: Family Medicine

## 2018-01-27 DIAGNOSIS — I10 Essential (primary) hypertension: Secondary | ICD-10-CM | POA: Diagnosis not present

## 2018-01-27 DIAGNOSIS — I150 Renovascular hypertension: Secondary | ICD-10-CM

## 2018-01-27 DIAGNOSIS — Z760 Encounter for issue of repeat prescription: Secondary | ICD-10-CM

## 2018-01-27 MED ORDER — CLONIDINE HCL 0.1 MG PO TABS: 0.1000 mg | ORAL_TABLET | Freq: Three times a day (TID) | ORAL | 1 refills | Status: DC

## 2018-01-27 NOTE — ED Provider Notes (Signed)
[facility]   [id number] 01/27/18 Arrival Time: 1114  SUBJECTIVE:  Rebecca Marquez is a 57 y.o. female who presents for medication refill.  Currently asymptomatic.  States average blood pressure is well controlled on current blood pressure medication.  Denies fever ,chills, chest pain, SOB, light-headedness, vision changes, abdominal pain, changes in bowel or bladder habits.      ROS: As per HPI.  Past Medical History:  Diagnosis Date  . Hypertension Dx March 2015  . Kidney disease   . Renal disorder    R/T HTN  . Seasonal allergies    Past Surgical History:  Procedure Laterality Date  . TUBAL LIGATION     Allergies  Allergen Reactions  . Losartan Tinitus   No current facility-administered medications on file prior to encounter.    Current Outpatient Medications on File Prior to Encounter  Medication Sig Dispense Refill  . amLODipine (NORVASC) 10 MG tablet Take 1 tablet (10 mg total) by mouth daily. 30 tablet 1  . ketotifen (ZADITOR) 0.025 % ophthalmic solution Place 1 drop into both eyes 2 (two) times daily. 5 mL 0    Social History   Socioeconomic History  . Marital status: Legally Separated    Spouse name: Not on file  . Number of children: Not on file  . Years of education: Not on file  . Highest education level: Not on file  Occupational History  . Not on file  Social Needs  . Financial resource strain: Not on file  . Food insecurity:    Worry: Not on file    Inability: Not on file  . Transportation needs:    Medical: Not on file    Non-medical: Not on file  Tobacco Use  . Smoking status: Former Smoker    Packs/day: 0.50    Years: 20.00    Pack years: 10.00    Types: Cigarettes    Last attempt to quit: 08/31/2001    Years since quitting: 16.4  . Smokeless tobacco: Never Used  Substance and Sexual Activity  . Alcohol use: Yes  . Drug use: Yes    Types: Marijuana    Comment: last used 04/28/2015  . Sexual activity: Not on file  Lifestyle    . Physical activity:    Days per week: Not on file    Minutes per session: Not on file  . Stress: Not on file  Relationships  . Social connections:    Talks on phone: Not on file    Gets together: Not on file    Attends religious service: Not on file    Active member of club or organization: Not on file    Attends meetings of clubs or organizations: Not on file    Relationship status: Not on file  . Intimate partner violence:    Fear of current or ex partner: Not on file    Emotionally abused: Not on file    Physically abused: Not on file    Forced sexual activity: Not on file  Other Topics Concern  . Not on file  Social History Narrative  . Not on file   Family History  Problem Relation Age of Onset  . Diabetes Mother   . Multiple sclerosis Daughter 35     OBJECTIVE:  Vitals:   01/27/18 1138  BP: (!) 108/58  Pulse: 97  Resp: 16  Temp: 98.5 F (36.9 C)  TempSrc: Oral  SpO2: 97%  Weight: 112 lb (50.8 kg)  Height: 5' 3.5" (  1.613 m)     General appearance: AOx3 in no acute distress HEENT: PERRL.  EOM grossly intact; tonsils nonerythematous, uvula midline Neck: supple without LAD Lungs: clear to auscultation bilaterally without adventitious breath sounds Heart: regular rate and rhythm.  Radial pulses 2+ symmetrical bilaterally; no peripheral edema Abdomen: Normal active bowel sounds; nontender to palpation Skin: warm and dry Psychological: alert and cooperative; normal mood and affect   No results found.  ASSESSMENT & PLAN:  1. Essential hypertension   2. Medication refill   3. Renovascular hypertension     Meds ordered this encounter  Medications  . cloNIDine (CATAPRES) 0.1 MG tablet    Sig: Take 1 tablet (0.1 mg total) by mouth 3 (three) times daily.    Dispense:  90 tablet    Refill:  1    Must have office visit for refills    Order Specific Question:   Supervising Provider    Answer:   Wynona Luna [[id number]]    Clonidine refilled.   Take as prescribed PCP assistance initiated Follow up with PCP for continued management of high blood pressure Return or go to the ER if you have any new symptoms such as fever, chills, headaches, chest pain, shortness of breath, vision changes, urinary or bowel habit changes, etc...   Reviewed expectations re: course of current medical issues. Questions answered. Outlined signs and symptoms indicating need for more acute intervention. Patient verbalized understanding. After Visit Summary given.          Lestine Box, PA-C 01/27/18 1211

## 2018-01-27 NOTE — Discharge Instructions (Addendum)
Clonidine refilled.  Take as prescribed PCP assistance initiated Follow up with PCP for continued management of high blood pressure Return or go to the ER if you have any new symptoms such as fever, chills, headaches, chest pain, shortness of breath, vision changes, urinary or bowel habit changes, etc..Marland Kitchen

## 2018-01-27 NOTE — ED Triage Notes (Signed)
Pt here for refill on BP meds. Clonidine 0.1mg .

## 2018-02-03 ENCOUNTER — Other Ambulatory Visit: Payer: Self-pay | Admitting: Orthopedic Surgery

## 2018-03-17 ENCOUNTER — Ambulatory Visit (HOSPITAL_BASED_OUTPATIENT_CLINIC_OR_DEPARTMENT_OTHER): Admission: RE | Admit: 2018-03-17 | Payer: 59 | Source: Ambulatory Visit | Admitting: Orthopedic Surgery

## 2018-03-17 ENCOUNTER — Encounter (HOSPITAL_BASED_OUTPATIENT_CLINIC_OR_DEPARTMENT_OTHER): Admission: RE | Payer: Self-pay | Source: Ambulatory Visit

## 2018-03-17 SURGERY — EXCISION MASS
Anesthesia: Choice | Laterality: Left

## 2018-03-24 ENCOUNTER — Encounter (HOSPITAL_COMMUNITY): Payer: Self-pay

## 2018-03-24 ENCOUNTER — Ambulatory Visit (HOSPITAL_COMMUNITY)
Admission: EM | Admit: 2018-03-24 | Discharge: 2018-03-24 | Disposition: A | Discharge status: Home / Self Care | Payer: 59 | Attending: Internal Medicine | Admitting: Internal Medicine

## 2018-03-24 DIAGNOSIS — I10 Essential (primary) hypertension: Secondary | ICD-10-CM | POA: Diagnosis not present

## 2018-03-24 DIAGNOSIS — Z760 Encounter for issue of repeat prescription: Secondary | ICD-10-CM

## 2018-03-24 DIAGNOSIS — I150 Renovascular hypertension: Secondary | ICD-10-CM

## 2018-03-24 MED ORDER — AMLODIPINE BESYLATE 10 MG PO TABS
10.0000 mg | ORAL_TABLET | Freq: Every day | ORAL | 1 refills | Status: DC
Start: 2018-03-24 — End: 2018-06-05

## 2018-03-24 MED ORDER — CLONIDINE HCL 0.1 MG PO TABS: 0.1000 mg | ORAL_TABLET | Freq: Three times a day (TID) | ORAL | 1 refills | Status: DC

## 2018-03-24 NOTE — Discharge Instructions (Addendum)
Refills were sent to the pharmacy for clonidine and amlodipine (for blood pressure control).  Contact information for a couple possible primary care options are in this handout.

## 2018-03-24 NOTE — ED Triage Notes (Signed)
Pt presents for medication refill.  

## 2018-03-24 NOTE — ED Provider Notes (Signed)
[facility]    CSN: [id number] Arrival date & time: 03/24/18  1244     History   Chief Complaint Chief Complaint  Patient presents with  . Medication Refill    HPI Rebecca Marquez is a 57 y.o. female.   She presents today with request for refill of antihypertensives.  She is in between primary care providers.  Current medications are amlodipine 10 mg daily and clonidine 0.1 mg 3 times daily.  Feels fine, and blood pressure is well controlled.  No orthostatic symptoms reported.    HPI  Past Medical History:  Diagnosis Date  . Hypertension Dx March 2015  . Kidney disease   . Renal disorder    R/T HTN  . Seasonal allergies     Patient Active Problem List   Diagnosis Date Noted  . 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 Transient vision disturbance of both eyes 09/25/2016  . Poor dentition 02/06/2016  . Glossitis 04/29/2015  . HTN (hypertension) 12/11/2013  . HLD (hyperlipidemia) 11/16/2013  . CKD (chronic kidney disease) stage 4, GFR 15-29 ml/min (HCC) 11/16/2013    Past Surgical History:  Procedure Laterality Date  . TUBAL LIGATION      Home Medications    Prior to Admission medications   Medication Sig Start Date End Date Taking? Authorizing Provider  amLODipine (NORVASC) 10 MG tablet Take 1 tablet (10 mg total) by mouth daily. 03/24/18   Wynona Luna, MD  cloNIDine (CATAPRES) 0.1 MG tablet Take 1 tablet (0.1 mg total) by mouth 3 (three) times daily. 03/24/18   Wynona Luna, MD  ketotifen (ZADITOR) 0.025 % ophthalmic solution Place 1 drop into both eyes 2 (two) times daily. 05/06/17   Janne Napoleon, NP    Family History Family History  Problem Relation Age of Onset  . Diabetes Mother   . Multiple sclerosis Daughter 57    Social History Social History   Tobacco Use  . Smoking status: Former Smoker    Packs/day: 0.50    Years: 20.00    Pack years: 10.00    Types: Cigarettes    Last attempt to quit: 08/31/2001    Years since quitting: 16.5  .  Smokeless tobacco: Never Used  Substance Use Topics  . Alcohol use: Yes  . Drug use: Yes    Types: Marijuana    Comment: last used 04/28/2015     Allergies   Nsaids and Losartan   Review of Systems Review of Systems  All other systems reviewed and are negative.    Physical Exam Triage Vital Signs ED Triage Vitals  Enc Vitals Group     BP 03/24/18 1318 118/76     Pulse Rate 03/24/18 1318 97     Resp 03/24/18 1318 20     Temp 03/24/18 1318 98.9 F (37.2 C)     Temp Source 03/24/18 1318 Temporal     SpO2 03/24/18 1318 100 %     Weight --      Height --      Pain Score 03/24/18 1317 0     Pain Loc --    Updated Vital Signs BP 118/76 (BP Location: Left Arm)   Pulse 97   Temp 98.9 F (37.2 C) (Temporal)   Resp 20   SpO2 100%  Physical Exam  Constitutional: She is oriented to person, place, and time. No distress.  Alert, nicely groomed  HENT:  Head: Atraumatic.  Eyes:  Conjugate gaze, no eye redness/drainage  Neck: Neck supple.  Cardiovascular: Normal rate.  Pulmonary/Chest: No respiratory distress.  Lungs clear, symmetric breath sounds  Abdominal: She exhibits no distension.  Musculoskeletal: Normal range of motion.  No leg swelling  Neurological: She is alert and oriented to person, place, and time.  Skin: Skin is warm and dry.  No cyanosis  Nursing note and vitals reviewed.     Final Clinical Impressions(s) / UC Diagnoses   Final diagnoses:  Encounter for medication refill  Hypertension, unspecified type     Discharge Instructions     Refills were sent to the pharmacy for clonidine and amlodipine (for blood pressure control).  Contact information for a couple possible primary care options are in this handout.     ED Prescriptions    Medication Sig Dispense Auth. Provider   amLODipine (NORVASC) 10 MG tablet Take 1 tablet (10 mg total) by mouth daily. 30 tablet Wynona Luna, MD   cloNIDine (CATAPRES) 0.1 MG tablet Take 1 tablet (0.1 mg  total) by mouth 3 (three) times daily. 90 tablet Wynona Luna, MD        Wynona Luna, MD 03/28/18 2118

## 2018-06-05 ENCOUNTER — Ambulatory Visit (HOSPITAL_COMMUNITY)
Admission: EM | Admit: 2018-06-05 | Discharge: 2018-06-05 | Disposition: A | Discharge status: Home / Self Care | Payer: 59 | Attending: Family Medicine | Admitting: Family Medicine

## 2018-06-05 ENCOUNTER — Telehealth (HOSPITAL_COMMUNITY): Payer: Self-pay

## 2018-06-05 ENCOUNTER — Encounter (HOSPITAL_COMMUNITY): Payer: Self-pay

## 2018-06-05 DIAGNOSIS — Z760 Encounter for issue of repeat prescription: Secondary | ICD-10-CM

## 2018-06-05 DIAGNOSIS — I150 Renovascular hypertension: Secondary | ICD-10-CM

## 2018-06-05 DIAGNOSIS — I10 Essential (primary) hypertension: Secondary | ICD-10-CM

## 2018-06-05 MED ORDER — CLONIDINE HCL 0.1 MG PO TABS: 0.1000 mg | ORAL_TABLET | Freq: Three times a day (TID) | ORAL | 1 refills | Status: DC

## 2018-06-05 MED ORDER — AMLODIPINE BESYLATE 10 MG PO TABS: 10.0000 mg | ORAL_TABLET | Freq: Every day | ORAL | 1 refills | Status: DC

## 2018-06-05 NOTE — ED Triage Notes (Signed)
Pt reports needing refill on clonidine and amlodipine. Reports running out today.

## 2018-06-05 NOTE — ED Provider Notes (Signed)
[facility]    CSN: [id number] Arrival date & time: 06/05/18  1115     History   Chief Complaint Chief Complaint  Patient presents with  . Medication Refill    HPI Rebecca Marquez is a 57 y.o. female.   HPI Medication refill  Patient is without insurance and without PCP.  Has a history of hypertension which has been very well controlled on amlodipine and clonidine.  She is here today as she is on her last dose of each medication today. Reports occasionally checks her blood pressure and actively work to reduce intake of sodium. She denies chest pain, shortness of breath, headache, dizziness, or new weakness. Past Medical History:  Diagnosis Date  . Hypertension Dx March 2015  . Kidney disease   . Renal disorder    R/T HTN  . Seasonal allergies     Patient Active Problem List   Diagnosis Date Noted  . 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 Transient vision disturbance of both eyes 09/25/2016  . Poor dentition 02/06/2016  . Glossitis 04/29/2015  . HTN (hypertension) 12/11/2013  . HLD (hyperlipidemia) 11/16/2013  . CKD (chronic kidney disease) stage 4, GFR 15-29 ml/min (HCC) 11/16/2013    Past Surgical History:  Procedure Laterality Date  . TUBAL LIGATION      OB History   None      Home Medications    Prior to Admission medications   Medication Sig Start Date End Date Taking? Authorizing Provider  amLODipine (NORVASC) 10 MG tablet Take 1 tablet (10 mg total) by mouth daily. 03/24/18   Wynona Luna, MD  cloNIDine (CATAPRES) 0.1 MG tablet Take 1 tablet (0.1 mg total) by mouth 3 (three) times daily. 03/24/18   Wynona Luna, MD  ketotifen (ZADITOR) 0.025 % ophthalmic solution Place 1 drop into both eyes 2 (two) times daily. 05/06/17   Janne Napoleon, NP    Family History Family History  Problem Relation Age of Onset  . Diabetes Mother   . Multiple sclerosis Daughter 12    Social History Social History   Tobacco Use  . Smoking status: Former  Smoker    Packs/day: 0.50    Years: 20.00    Pack years: 10.00    Types: Cigarettes    Last attempt to quit: 08/31/2001    Years since quitting: 16.7  . Smokeless tobacco: Never Used  Substance Use Topics  . Alcohol use: Yes  . Drug use: Yes    Types: Marijuana    Comment: last used 04/28/2015     Allergies   Nsaids and Losartan   Review of Systems Review of Systems Pertinent negatives listed in HPI Physical Exam Triage Vital Signs ED Triage Vitals [06/05/18 1211]  Enc Vitals Group     BP 108/73     Pulse Rate 87     Resp 18     Temp 98.2 F (36.8 C)     Temp src      SpO2 100 %     Weight      Height      Head Circumference      Peak Flow      Pain Score 0     Pain Loc      Pain Edu?      Excl. in [place]?    No data found.  Updated Vital Signs BP 108/73   Pulse 87   Temp 98.2 F (36.8 C)   Resp 18   SpO2 100%  Visual Acuity Right Eye Distance:   Left Eye Distance:   Bilateral Distance:    Right Eye Near:   Left Eye Near:    Bilateral Near:     Physical Exam General appearance: alert, well developed, well nourished, cooperative and in no distress Head: Normocephalic, without obvious abnormality, atraumatic Respiratory: Respirations even and unlabored, normal respiratory rate Cardiovascular: Normal rate rhythm. No gallops or murmurs noted. Extremities: No gross deformities Skin: Skin color, texture, turgor normal. No rashes seen  Psych: Appropriate mood and affect. Neurologic: Mental status: Alert, oriented to person, place, and time, thought content appropriate.  UC Treatments / Results  Labs (all labs ordered are listed, but only abnormal results are displayed) Labs Reviewed - No data to display  EKG None  Radiology No results found.  Procedures Procedures (including critical care time)  Medications Ordered in UC Medications - No data to display  Initial Impression / Assessment and Plan / UC Course  I have reviewed the triage vital  signs and the nursing notes.  Pertinent labs & imaging results that were available during my care of the patient were reviewed by me and considered in my medical decision making (see chart for details).  Patient's amlodipine and clonidine refilled today.  She is normotensive on today's visit.  She has been given information to establish with a PCP who accepts patients without insurance.   Final Clinical Impressions(s) / UC Diagnoses   Final diagnoses:  Medication refill  Essential hypertension   Discharge Instructions   None    ED Prescriptions    Medication Sig Dispense Auth. Provider   amLODipine (NORVASC) 10 MG tablet Take 1 tablet (10 mg total) by mouth daily. 30 tablet Scot Jun, FNP   cloNIDine (CATAPRES) 0.1 MG tablet Take 1 tablet (0.1 mg total) by mouth 3 (three) times daily. 90 tablet Scot Jun, FNP     Controlled Substance Prescriptions [place] Controlled Substance Registry consulted? Not Applicable   Scot Jun, [place] 06/07/18 2225

## 2018-06-06 ENCOUNTER — Telehealth: Payer: Self-pay | Admitting: Family Medicine

## 2018-06-06 ENCOUNTER — Telehealth: Payer: Self-pay | Admitting: General Practice

## 2018-06-06 NOTE — Telephone Encounter (Signed)
Called patient to schedule appointment, no answer left voicemail.

## 2018-06-06 NOTE — Telephone Encounter (Signed)
Please contact patient to schedule new patient appointment and follow-up on hypertension. Patient was seen by me at urgent care yesterday.

## 2018-06-27 ENCOUNTER — Encounter (HOSPITAL_COMMUNITY): Payer: Self-pay | Admitting: Emergency Medicine

## 2018-06-27 ENCOUNTER — Ambulatory Visit (HOSPITAL_COMMUNITY)
Admission: EM | Admit: 2018-06-27 | Discharge: 2018-06-27 | Disposition: A | Discharge status: Home / Self Care | Payer: Self-pay | Attending: Family Medicine | Admitting: Family Medicine

## 2018-06-27 DIAGNOSIS — B9789 Other viral agents as the cause of diseases classified elsewhere: Secondary | ICD-10-CM

## 2018-06-27 DIAGNOSIS — J069 Acute upper respiratory infection, unspecified: Secondary | ICD-10-CM

## 2018-06-27 MED ORDER — CETIRIZINE HCL 5 MG PO TABS: 5.0000 mg | ORAL_TABLET | Freq: Every day | ORAL | 1 refills | Status: DC

## 2018-06-27 MED ORDER — FLUTICASONE PROPIONATE 50 MCG/ACT NA SUSP: 1.0000 | Freq: Every day | NASAL | 2 refills | Status: DC

## 2018-06-27 MED ORDER — GUAIFENESIN 100 MG/5ML PO LIQD: 100.0000 mg | ORAL | 0 refills | Status: DC | PRN

## 2018-06-27 NOTE — Discharge Instructions (Addendum)
°  It was nice meeting you!!  I believe you have a viral upper respiratory infection. It could take 7 to 10 days for the symptoms to decrease or improve.  Zyrtec and mucinex for the drainage in your throat and congestion.  Flonase for nasal congestion Ibuprofen and tylenol can help with the pain.  Chloraseptic throat spray or lozenges are another option for symptoms relief.  Follow up as needed for worsening symptoms.

## 2018-06-27 NOTE — ED Provider Notes (Signed)
[place]    CSN: [id number] Arrival date & time: 06/27/18  1243     History   Chief Complaint Chief Complaint  Patient presents with  . URI    HPI Rebecca Marquez is a 57 y.o. female.    URI  Presenting symptoms: congestion, cough, ear pain, rhinorrhea and sore throat   Severity:  Mild Duration:  2 days Timing:  Constant Progression:  Unchanged Chronicity:  New Relieved by:  Nothing Worsened by:  Nothing Ineffective treatments:  None tried Associated symptoms: no arthralgias, no headaches, no myalgias, no neck pain, no sinus pain, no sneezing, no swollen glands and no wheezing   Associated symptoms comment:  Denies any fever, chills. She does report some aches.  Risk factors: no recent illness, no recent travel and no sick contacts     Past Medical History:  Diagnosis Date  . Hypertension Dx March 2015  . Kidney disease   . Renal disorder    R/T HTN  . Seasonal allergies     Patient Active Problem List   Diagnosis Date Noted  . 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 Transient vision disturbance of both eyes 09/25/2016  . Poor dentition 02/06/2016  . Glossitis 04/29/2015  . HTN (hypertension) 12/11/2013  . HLD (hyperlipidemia) 11/16/2013  . CKD (chronic kidney disease) stage 4, GFR 15-29 ml/min (HCC) 11/16/2013    Past Surgical History:  Procedure Laterality Date  . TUBAL LIGATION      OB History   None      Home Medications    Prior to Admission medications   Medication Sig Start Date End Date Taking? Authorizing Provider  amLODipine (NORVASC) 10 MG tablet Take 1 tablet (10 mg total) by mouth daily. 06/05/18   Scot Jun, FNP  cetirizine (ZYRTEC) 5 MG tablet Take 1 tablet (5 mg total) by mouth daily. 06/27/18   Loura Halt A, NP  cloNIDine (CATAPRES) 0.1 MG tablet Take 1 tablet (0.1 mg total) by mouth 3 (three) times daily. 06/05/18   Scot Jun, FNP  fluticasone (FLONASE) 50 MCG/ACT nasal spray Place 1 spray into both nostrils  daily. 06/27/18   Loura Halt A, NP  guaiFENesin (ROBITUSSIN) 100 MG/5ML liquid Take 5-10 mLs (100-200 mg total) by mouth every 4 (four) hours as needed for cough. 06/27/18   Loura Halt A, NP  ketotifen (ZADITOR) 0.025 % ophthalmic solution Place 1 drop into both eyes 2 (two) times daily. 05/06/17   Janne Napoleon, NP    Family History Family History  Problem Relation Age of Onset  . Diabetes Mother   . Multiple sclerosis Daughter 82    Social History Social History   Tobacco Use  . Smoking status: Former Smoker    Packs/day: 0.50    Years: 20.00    Pack years: 10.00    Types: Cigarettes    Last attempt to quit: 08/31/2001    Years since quitting: 16.8  . Smokeless tobacco: Never Used  Substance Use Topics  . Alcohol use: Yes  . Drug use: Yes    Types: Marijuana    Comment: last used 04/28/2015     Allergies   Nsaids and Losartan   Review of Systems Review of Systems  HENT: Positive for congestion, ear pain, rhinorrhea and sore throat. Negative for sinus pain and sneezing.   Respiratory: Positive for cough. Negative for wheezing.   Musculoskeletal: Negative for arthralgias, myalgias and neck pain.  Neurological: Negative for headaches.     Physical Exam  Triage Vital Signs ED Triage Vitals  Enc Vitals Group     BP 06/27/18 1326 112/79     Pulse Rate 06/27/18 1326 70     Resp 06/27/18 1326 18     Temp 06/27/18 1326 98.1 F (36.7 C)     Temp Source 06/27/18 1326 Oral     SpO2 06/27/18 1326 100 %     Weight --      Height --      Head Circumference --      Peak Flow --      Pain Score 06/27/18 1327 5     Pain Loc --      Pain Edu? --      Excl. in [place]? --    No data found.  Updated Vital Signs BP 112/79 (BP Location: Right Arm)   Pulse 70   Temp 98.1 F (36.7 C) (Oral)   Resp 18   SpO2 100%   Visual Acuity Right Eye Distance:   Left Eye Distance:   Bilateral Distance:    Right Eye Near:   Left Eye Near:    Bilateral Near:     Physical Exam    Constitutional: She appears well-developed and well-nourished.  Very pleasant. Non toxic or ill appearing.   HENT:  Head: Normocephalic and atraumatic.  Right Ear: External ear normal.  Left Ear: External ear normal.  Bilateral TMs normal.  External ears normal.  Mild  posterior oropharyngeal erythema, without tonsillar swelling or exudates. No lesions. Drainage noted.  Moderate nasal turbinate swelling.  No lymphadenopathy.     Eyes: Conjunctivae are normal.  Neck: Normal range of motion.  Pulmonary/Chest: Effort normal.  Lungs clear in all fields. No dyspnea or distress. No retractions or nasal flaring.   Musculoskeletal: Normal range of motion.  Neurological: She is alert.  Skin: Skin is warm.  Psychiatric: She has a normal mood and affect.  Nursing note and vitals reviewed.    UC Treatments / Results  Labs (all labs ordered are listed, but only abnormal results are displayed) Labs Reviewed - No data to display  EKG None  Radiology No results found.  Procedures Procedures (including critical care time)  Medications Ordered in UC Medications - No data to display  Initial Impression / Assessment and Plan / UC Course  I have reviewed the triage vital signs and the nursing notes.  Pertinent labs & imaging results that were available during my care of the patient were reviewed by me and considered in my medical decision making (see chart for details).     Viral URI- symptomatic treatment Work note given Follow up as needed for continued or worsening symptoms  Final Clinical Impressions(s) / UC Diagnoses   Final diagnoses:  Viral URI with cough     Discharge Instructions      It was nice meeting you!!  I believe you have a viral upper respiratory infection. It could take 7 to 10 days for the symptoms to decrease or improve.  Zyrtec and mucinex for the drainage in your throat and congestion.  Flonase for nasal congestion Ibuprofen and tylenol can help  with the pain.  Chloraseptic throat spray or lozenges are another option for symptoms relief.  Follow up as needed for worsening symptoms.      ED Prescriptions    Medication Sig Dispense Auth. Provider   fluticasone (FLONASE) 50 MCG/ACT nasal spray Place 1 spray into both nostrils daily. 16 g Jequan Shahin A, NP   cetirizine (  ZYRTEC) 5 MG tablet Take 1 tablet (5 mg total) by mouth daily. 30 tablet Shayma Pfefferle A, NP   guaiFENesin (ROBITUSSIN) 100 MG/5ML liquid Take 5-10 mLs (100-200 mg total) by mouth every 4 (four) hours as needed for cough. 60 mL Loura Halt A, NP     Controlled Substance Prescriptions [place] Controlled Substance Registry consulted? Not Applicable   Orvan July, NP 06/27/18 1415

## 2018-06-27 NOTE — ED Triage Notes (Signed)
Pt sts URI sx with congestion and ear pain

## 2018-07-01 ENCOUNTER — Encounter: Payer: Self-pay | Admitting: Family Medicine

## 2018-07-04 ENCOUNTER — Ambulatory Visit: Payer: Self-pay | Admitting: Family Medicine

## 2018-08-09 ENCOUNTER — Ambulatory Visit (HOSPITAL_COMMUNITY)
Admission: EM | Admit: 2018-08-09 | Discharge: 2018-08-09 | Disposition: A | Discharge status: Home / Self Care | Payer: Self-pay | Attending: Family Medicine | Admitting: Family Medicine

## 2018-08-09 ENCOUNTER — Encounter (HOSPITAL_COMMUNITY): Payer: Self-pay | Admitting: Emergency Medicine

## 2018-08-09 DIAGNOSIS — I150 Renovascular hypertension: Secondary | ICD-10-CM

## 2018-08-09 DIAGNOSIS — Z760 Encounter for issue of repeat prescription: Secondary | ICD-10-CM

## 2018-08-09 MED ORDER — AMLODIPINE BESYLATE 10 MG PO TABS: 10.0000 mg | ORAL_TABLET | Freq: Every day | ORAL | 1 refills | Status: DC

## 2018-08-09 MED ORDER — CLONIDINE HCL 0.1 MG PO TABS: 0.1000 mg | ORAL_TABLET | Freq: Three times a day (TID) | ORAL | 1 refills | Status: DC

## 2018-08-09 NOTE — ED Provider Notes (Signed)
[place]    CSN: [id number] Arrival date & time: 08/09/18  1336     History   Chief Complaint Chief Complaint  Patient presents with  . Medication Refill    HPI Rebecca Marquez is a 57 y.o. female.   HPI  Patient is currently unemployed.  She is not going to any doctor.  She does not have any insurance.  She is here for medicine refills.  She states she is low on her medicines but has not yet run out.  She is not being followed by anyone having regular blood work.  Her last measured GFR was 37, her last measured creatinine was 2.0.  She is known to have stage IV kidney disease, but is not being followed by PCP or nephrology. I explained to the patient the importance of being seen on a regular basis and having blood work done to follow her medical illnesses.  She needs preventative care.  We reviewed clinics available to her. She states that she feels well.  Past Medical History:  Diagnosis Date  . Hypertension Dx March 2015  . Kidney disease   . Renal disorder    R/T HTN  . Seasonal allergies     Patient Active Problem List   Diagnosis Date Noted  . 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 Transient vision disturbance of both eyes 09/25/2016  . Poor dentition 02/06/2016  . Glossitis 04/29/2015  . HTN (hypertension) 12/11/2013  . HLD (hyperlipidemia) 11/16/2013  . CKD (chronic kidney disease) stage 4, GFR 15-29 ml/min (HCC) 11/16/2013    Past Surgical History:  Procedure Laterality Date  . TUBAL LIGATION      OB History   None      Home Medications    Prior to Admission medications   Medication Sig Start Date End Date Taking? Authorizing Provider  amLODipine (NORVASC) 10 MG tablet Take 1 tablet (10 mg total) by mouth daily. 08/09/18   Raylene Everts, MD  cetirizine (ZYRTEC) 5 MG tablet Take 1 tablet (5 mg total) by mouth daily. 06/27/18   Loura Halt A, NP  cloNIDine (CATAPRES) 0.1 MG tablet Take 1 tablet (0.1 mg total) by mouth 3 (three) times daily.  08/09/18   Raylene Everts, MD  fluticasone (FLONASE) 50 MCG/ACT nasal spray Place 1 spray into both nostrils daily. 06/27/18   Orvan July, NP    Family History Family History  Problem Relation Age of Onset  . Diabetes Mother   . Arthritis Mother   . Multiple sclerosis Daughter 67    Social History Social History   Tobacco Use  . Smoking status: Former Smoker    Packs/day: 0.50    Years: 20.00    Pack years: 10.00    Types: Cigarettes    Last attempt to quit: 08/31/2001    Years since quitting: 16.9  . Smokeless tobacco: Never Used  Substance Use Topics  . Alcohol use: Yes  . Drug use: Yes    Types: Marijuana    Comment: last used 04/28/2015     Allergies   Nsaids and Losartan   Review of Systems Review of Systems  Constitutional: Negative for chills and fever.  HENT: Negative for ear pain and sore throat.   Eyes: Negative for pain and visual disturbance.  Respiratory: Negative for cough and shortness of breath.   Cardiovascular: Negative for chest pain and palpitations.  Gastrointestinal: Negative for abdominal pain and vomiting.  Genitourinary: Negative for dysuria and hematuria.  Musculoskeletal: Negative  for arthralgias and back pain.  Skin: Negative for color change and rash.  Neurological: Negative for seizures and syncope.  All other systems reviewed and are negative.    Physical Exam Triage Vital Signs ED Triage Vitals  Enc Vitals Group     BP 08/09/18 1437 (!) 143/84     Pulse Rate 08/09/18 1437 75     Resp 08/09/18 1437 18     Temp 08/09/18 1436 98.3 F (36.8 C)     Temp src --      SpO2 08/09/18 1437 100 %     Weight --      Height --      Head Circumference --      Peak Flow --      Pain Score 08/09/18 1437 0     Pain Loc --      Pain Edu? --      Excl. in [place]? --    No data found.  Updated Vital Signs BP (!) 143/84   Pulse 75   Temp 98.3 F (36.8 C)   Resp 18   SpO2 100%   Visual Acuity Right Eye Distance:   Left Eye  Distance:   Bilateral Distance:    Right Eye Near:   Left Eye Near:    Bilateral Near:     Physical Exam  Constitutional: She appears well-developed and well-nourished. No distress.  HENT:  Head: Normocephalic and atraumatic.  Mouth/Throat: Oropharynx is clear and moist.  Poor dentition  Eyes: Pupils are equal, round, and reactive to light. Conjunctivae are normal.  Neck: Normal range of motion.  Cardiovascular: Normal rate, regular rhythm and normal heart sounds.  Pulmonary/Chest: Effort normal and breath sounds normal. No respiratory distress.  Abdominal: Soft. Bowel sounds are normal. She exhibits no distension.  Musculoskeletal: Normal range of motion. She exhibits no edema.  Neurological: She is alert.  Skin: Skin is warm and dry.  Psychiatric: She has a normal mood and affect. Her behavior is normal.     UC Treatments / Results  Labs (all labs ordered are listed, but only abnormal results are displayed) Labs Reviewed - No data to display  EKG None  Radiology No results found.  Procedures Procedures (including critical care time)  Medications Ordered in UC Medications - No data to display  Initial Impression / Assessment and Plan / UC Course  I have reviewed the triage vital signs and the nursing notes.  Pertinent labs & imaging results that were available during my care of the patient were reviewed by me and considered in my medical decision making (see chart for details).     Patient states that both her parents are elderly and in need of her care.  I told her that there are programs that will pay her to stay home and care for her parents, if healthcare is deemed necessary by their physicians.  She will look into it through social services.  I also gave her the name and number for PCP for follow-up Final Clinical Impressions(s) / UC Diagnoses   Final diagnoses:  Renovascular hypertension  Medication refill     Discharge Instructions     Drink plenty  of water Avoid salt Take your medicine as directed See about going to [facility] for primary care You are over due for blood work to check your kidneys  Call DSS ( social services to wee how to get help with your parents care)     ED Prescriptions  Medication Sig Dispense Auth. Provider   amLODipine (NORVASC) 10 MG tablet Take 1 tablet (10 mg total) by mouth daily. 30 tablet Raylene Everts, MD   cloNIDine (CATAPRES) 0.1 MG tablet Take 1 tablet (0.1 mg total) by mouth 3 (three) times daily. 90 tablet Raylene Everts, MD     Controlled Substance Prescriptions [place] Controlled Substance Registry consulted? Not Applicable   Raylene Everts, MD 08/09/18 [phone]

## 2018-08-09 NOTE — ED Triage Notes (Signed)
Pt states she needs a refill of her clonidine.

## 2018-08-09 NOTE — Discharge Instructions (Signed)
Drink plenty of water Avoid salt Take your medicine as directed See about going to [facility] wellness for primary care You are over due for blood work to check your kidneys  Call DSS ( social services to wee how to get help with your parents care)

## 2018-10-12 ENCOUNTER — Other Ambulatory Visit: Payer: Self-pay

## 2018-10-12 ENCOUNTER — Encounter (HOSPITAL_COMMUNITY): Payer: Self-pay | Admitting: Emergency Medicine

## 2018-10-12 ENCOUNTER — Ambulatory Visit (HOSPITAL_COMMUNITY)
Admission: EM | Admit: 2018-10-12 | Discharge: 2018-10-12 | Disposition: A | Discharge status: Home / Self Care | Payer: Self-pay | Attending: Internal Medicine | Admitting: Internal Medicine

## 2018-10-12 DIAGNOSIS — N184 Chronic kidney disease, stage 4 (severe): Secondary | ICD-10-CM

## 2018-10-12 DIAGNOSIS — I129 Hypertensive chronic kidney disease with stage 1 through stage 4 chronic kidney disease, or unspecified chronic kidney disease: Secondary | ICD-10-CM

## 2018-10-12 DIAGNOSIS — I150 Renovascular hypertension: Secondary | ICD-10-CM

## 2018-10-12 DIAGNOSIS — I10 Essential (primary) hypertension: Secondary | ICD-10-CM

## 2018-10-12 DIAGNOSIS — Z760 Encounter for issue of repeat prescription: Secondary | ICD-10-CM

## 2018-10-12 MED ORDER — AMLODIPINE BESYLATE 10 MG PO TABS: 10.0000 mg | ORAL_TABLET | Freq: Every day | ORAL | 0 refills | Status: DC

## 2018-10-12 MED ORDER — CLONIDINE HCL 0.1 MG PO TABS: 0.1000 mg | ORAL_TABLET | Freq: Three times a day (TID) | ORAL | 1 refills | Status: DC

## 2018-10-12 NOTE — ED Triage Notes (Signed)
Patient requesting bp medication refill.  Patient has been taking correctly, but has one more pill available.

## 2018-10-12 NOTE — Discharge Instructions (Signed)
I have refilled you BP medicine Please get set up with primary care- information below  Please go to Emergency Room if you start to experience severe headache, vision changes, decreased urine production, chest pain, shortness of breath, speech slurring, one sided weakness.

## 2018-10-12 NOTE — ED Provider Notes (Signed)
[place]    CSN: [id number] Arrival date & time: 10/12/18  1258     History   Chief Complaint Chief Complaint  Patient presents with  . Medication Refill    HPI Rebecca Marquez is a 58 y.o. female history of CKD stage IV, hypertension, hyperlipidemia presenting today for medication refill.  Patient is requesting medication refill of her blood pressure medicines.  She has been on clonidine and amlodipine.  She has not had any issues with this.  Patient states that she currently does not have insurance and has not set up care with primary care.  She denies any headaches, vision changes, chest pain, shortness of breath, dizziness, weakness or difficulty speaking.  Denies changes in urination.  HPI  Past Medical History:  Diagnosis Date  . Hypertension Dx March 2015  . Kidney disease   . Renal disorder    R/T HTN  . Seasonal allergies     Patient Active Problem List   Diagnosis Date Noted  . 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 Transient vision disturbance of both eyes 09/25/2016  . Poor dentition 02/06/2016  . Glossitis 04/29/2015  . HTN (hypertension) 12/11/2013  . HLD (hyperlipidemia) 11/16/2013  . CKD (chronic kidney disease) stage 4, GFR 15-29 ml/min (HCC) 11/16/2013    Past Surgical History:  Procedure Laterality Date  . TUBAL LIGATION      OB History   No obstetric history on file.      Home Medications    Prior to Admission medications   Medication Sig Start Date End Date Taking? Authorizing Provider  amLODipine (NORVASC) 10 MG tablet Take 1 tablet (10 mg total) by mouth daily. 10/12/18   Wieters, Hallie C, PA-C  cetirizine (ZYRTEC) 5 MG tablet Take 1 tablet (5 mg total) by mouth daily. 06/27/18   Loura Halt A, NP  cloNIDine (CATAPRES) 0.1 MG tablet Take 1 tablet (0.1 mg total) by mouth 3 (three) times daily. 10/12/18   Wieters, Hallie C, PA-C  fluticasone (FLONASE) 50 MCG/ACT nasal spray Place 1 spray into both nostrils daily. 06/27/18   Orvan July, NP    Family History Family History  Problem Relation Age of Onset  . Diabetes Mother   . Arthritis Mother   . Multiple sclerosis Daughter 58    Social History Social History   Tobacco Use  . Smoking status: Former Smoker    Packs/day: 0.50    Years: 20.00    Pack years: 10.00    Types: Cigarettes    Last attempt to quit: 08/31/2001    Years since quitting: 17.1  . Smokeless tobacco: Never Used  Substance Use Topics  . Alcohol use: Yes  . Drug use: Yes    Types: Marijuana    Comment: last used 04/28/2015     Allergies   Nsaids and Losartan   Review of Systems Review of Systems  Constitutional: Negative for fatigue and fever.  HENT: Negative for congestion, sinus pressure and sore throat.   Eyes: Negative for photophobia, pain and visual disturbance.  Respiratory: Negative for cough and shortness of breath.   Cardiovascular: Negative for chest pain.  Gastrointestinal: Negative for abdominal pain, nausea and vomiting.  Genitourinary: Negative for decreased urine volume and hematuria.  Musculoskeletal: Negative for myalgias, neck pain and neck stiffness.  Neurological: Negative for dizziness, syncope, facial asymmetry, speech difficulty, weakness, light-headedness, numbness and headaches.     Physical Exam Triage Vital Signs ED Triage Vitals  Enc Vitals Group  BP 10/12/18 1443 126/71     Pulse Rate 10/12/18 1443 (!) 58     Resp 10/12/18 1443 18     Temp 10/12/18 1443 (!) 97.4 F (36.3 C)     Temp Source 10/12/18 1443 Temporal     SpO2 10/12/18 1443 100 %     Weight --      Height --      Head Circumference --      Peak Flow --      Pain Score 10/12/18 1441 0     Pain Loc --      Pain Edu? --      Excl. in [place]? --    No data found.  Updated Vital Signs BP 126/71 (BP Location: Right Arm)   Pulse (!) 58   Temp (!) 97.4 F (36.3 C) (Temporal)   Resp 18   SpO2 100%   Visual Acuity Right Eye Distance:   Left Eye Distance:   Bilateral  Distance:    Right Eye Near:   Left Eye Near:    Bilateral Near:     Physical Exam Vitals signs and nursing note reviewed.  Constitutional:      General: She is not in acute distress.    Appearance: She is well-developed.  HENT:     Head: Normocephalic and atraumatic.  Eyes:     Extraocular Movements: Extraocular movements intact.     Conjunctiva/sclera: Conjunctivae normal.     Pupils: Pupils are equal, round, and reactive to light.  Neck:     Musculoskeletal: Neck supple.  Cardiovascular:     Rate and Rhythm: Normal rate and regular rhythm.     Heart sounds: No murmur.  Pulmonary:     Effort: Pulmonary effort is normal. No respiratory distress.     Breath sounds: Normal breath sounds.     Comments: Breathing comfortably at rest, CTABL, no wheezing, rales or other adventitious sounds auscultated Abdominal:     Palpations: Abdomen is soft.     Tenderness: There is no abdominal tenderness.  Skin:    General: Skin is warm and dry.  Neurological:     Mental Status: She is alert.      UC Treatments / Results  Labs (all labs ordered are listed, but only abnormal results are displayed) Labs Reviewed - No data to display  EKG None  Radiology No results found.  Procedures Procedures (including critical care time)  Medications Ordered in UC Medications - No data to display  Initial Impression / Assessment and Plan / UC Course  I have reviewed the triage vital signs and the nursing notes.  Pertinent labs & imaging results that were available during my care of the patient were reviewed by me and considered in my medical decision making (see chart for details).     58 year old female, no acute distress, vital signs stable, blood pressure within normal range, will refill clonidine and amlodipine.  Again stressed importance of establishing care with primary care for further management of symptoms as has been discussed at previous visits.  Provided contact information  for primary care that accepts patients without insurance.  Discussed strict return precautions. Patient verbalized understanding and is agreeable with plan.  Final Clinical Impressions(s) / UC Diagnoses   Final diagnoses:  Medication refill  Essential hypertension     Discharge Instructions     I have refilled you BP medicine Please get set up with primary care- information below  Please go to Emergency Room if you  start to experience severe headache, vision changes, decreased urine production, chest pain, shortness of breath, speech slurring, one sided weakness.   ED Prescriptions    Medication Sig Dispense Auth. Provider   cloNIDine (CATAPRES) 0.1 MG tablet Take 1 tablet (0.1 mg total) by mouth 3 (three) times daily. 180 tablet Wieters, Hallie C, PA-C   amLODipine (NORVASC) 10 MG tablet Take 1 tablet (10 mg total) by mouth daily. 60 tablet Wieters, [place] C, PA-C     Controlled Substance Prescriptions [place] Controlled Substance Registry consulted? Not Applicable   Janith L[place], [place] 10/12/18 1551

## 2018-10-21 ENCOUNTER — Ambulatory Visit (INDEPENDENT_AMBULATORY_CARE_PROVIDER_SITE_OTHER): Payer: Self-pay

## 2018-10-21 ENCOUNTER — Encounter (HOSPITAL_COMMUNITY): Payer: Self-pay

## 2018-10-21 ENCOUNTER — Ambulatory Visit (HOSPITAL_COMMUNITY)
Admission: EM | Admit: 2018-10-21 | Discharge: 2018-10-21 | Disposition: A | Discharge status: Home / Self Care | Payer: Self-pay | Attending: Family Medicine | Admitting: Family Medicine

## 2018-10-21 ENCOUNTER — Telehealth (HOSPITAL_COMMUNITY): Payer: Self-pay | Admitting: Emergency Medicine

## 2018-10-21 DIAGNOSIS — N390 Urinary tract infection, site not specified: Secondary | ICD-10-CM

## 2018-10-21 DIAGNOSIS — R05 Cough: Secondary | ICD-10-CM

## 2018-10-21 MED ORDER — DM-GUAIFENESIN ER 30-600 MG PO TB12: 1.0000 | ORAL_TABLET | Freq: Two times a day (BID) | ORAL | 0 refills | Status: DC

## 2018-10-21 MED ORDER — BENZONATATE 200 MG PO CAPS: 200.0000 mg | ORAL_CAPSULE | Freq: Two times a day (BID) | ORAL | 0 refills | Status: DC | PRN

## 2018-10-21 MED ORDER — FLUTICASONE PROPIONATE 50 MCG/ACT NA SUSP: 1.0000 | Freq: Every day | NASAL | 2 refills | Status: DC

## 2018-10-21 NOTE — ED Provider Notes (Signed)
Rebecca Marquez    CSN: [id number] Arrival date & time: 10/21/18  1738     History   Chief Complaint Chief Complaint  Patient presents with  . Shortness of Breath  . Fatigue  . Anorexia    HPI Rebecca Marquez is a 58 y.o. female.   HPI Patient states that she has feeling sick since Tuesday.  She has a sore throat runny nose and cough.  Loss of appetite.  Feeling very tired.  She states she tried some over-the-counter medicine which did not help.  States that her appetite is been poor ever since she was started on blood pressure medication.  No dizziness or faint.  No shortness of breath or chest pain.  She does have some chest congestion and feels like she is coughing up some sputum.  She states it is mostly clear or white.  No fever or chills.  No known exposure to influenza Past Medical History:  Diagnosis Date  . Hypertension Dx March 2015  . Kidney disease   . Renal disorder    R/T HTN  . Seasonal allergies     Patient Active Problem List   Diagnosis Date Noted  . 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 Transient vision disturbance of both eyes 09/25/2016  . Poor dentition 02/06/2016  . Glossitis 04/29/2015  . HTN (hypertension) 12/11/2013  . HLD (hyperlipidemia) 11/16/2013  . CKD (chronic kidney disease) stage 4, GFR 15-29 ml/min (HCC) 11/16/2013    Past Surgical History:  Procedure Laterality Date  . TUBAL LIGATION      OB History   No obstetric history on file.      Home Medications    Prior to Admission medications   Medication Sig Start Date End Date Taking? Authorizing Provider  amLODipine (NORVASC) 10 MG tablet Take 1 tablet (10 mg total) by mouth daily. 10/12/18   Wieters, Hallie C, PA-C  benzonatate (TESSALON) 200 MG capsule Take 1 capsule (200 mg total) by mouth 2 (two) times daily as needed for cough. 10/21/18   Raylene Everts, MD  cetirizine (ZYRTEC) 5 MG tablet Take 1 tablet (5 mg total) by mouth daily. 06/27/18   Loura Halt A, NP  cloNIDine  (CATAPRES) 0.1 MG tablet Take 1 tablet (0.1 mg total) by mouth 3 (three) times daily. 10/12/18   Wieters, Hallie C, PA-C  dextromethorphan-guaiFENesin (MUCINEX DM) 30-600 MG 12hr tablet Take 1 tablet by mouth 2 (two) times daily. 10/21/18   Raylene Everts, MD  fluticasone [facility]) 50 MCG/ACT nasal spray Place 1 spray into both nostrils daily. 06/27/18   Orvan July, NP    Family History Family History  Problem Relation Age of Onset  . Diabetes Mother   . Arthritis Mother   . Multiple sclerosis Daughter 63    Social History Social History   Tobacco Use  . Smoking status: Former Smoker    Packs/day: 0.50    Years: 20.00    Pack years: 10.00    Types: Cigarettes    Last attempt to quit: 08/31/2001    Years since quitting: 17.1  . Smokeless tobacco: Never Used  Substance Use Topics  . Alcohol use: Yes  . Drug use: Yes    Types: Marijuana    Comment: last used 04/28/2015     Allergies   Nsaids and Losartan   Review of Systems Review of Systems  Constitutional: Positive for activity change and appetite change. Negative for chills and fever.  HENT: Positive for congestion and sore  throat. Negative for ear pain.   Eyes: Negative for pain and visual disturbance.  Respiratory: Positive for cough. Negative for shortness of breath.   Cardiovascular: Negative for chest pain and palpitations.  Gastrointestinal: Negative for abdominal pain and vomiting.  Genitourinary: Negative for dysuria and hematuria.  Musculoskeletal: Negative for arthralgias and back pain.  Skin: Negative for color change and rash.  Neurological: Negative for seizures and syncope.  All other systems reviewed and are negative.    Physical Exam Triage Vital Signs ED Triage Vitals  Enc Vitals Group     BP 10/21/18 1751 100/76     Pulse Rate 10/21/18 1751 98     Resp 10/21/18 1751 18     Temp 10/21/18 1751 98 F (36.7 C)     Temp src --      SpO2 10/21/18 1751 97 %   No data found.  Updated  Vital Signs BP 100/76 (BP Location: Right Arm)   Pulse 98   Temp 98 F (36.7 C)   Resp 18   SpO2 97%     Bilateral Near:     Physical Exam Constitutional:      General: She is not in acute distress.    Appearance: She is ill-appearing.     Comments: Patient appears underweight, frail.  HENT:     Head: Normocephalic and atraumatic.     Mouth/Throat:     Mouth: Mucous membranes are moist.     Comments: Poor dentition.  Halitosis Eyes:     Conjunctiva/sclera: Conjunctivae normal.     Pupils: Pupils are equal, round, and reactive to light.  Neck:     Musculoskeletal: Normal range of motion.  Cardiovascular:     Rate and Rhythm: Normal rate and regular rhythm.     Heart sounds: Normal heart sounds.  Pulmonary:     Effort: Pulmonary effort is normal. No respiratory distress.     Breath sounds: Rhonchi present. No wheezing.     Comments: Coarse rhonchi centrally that clear with cough. Chest:     Chest wall: No mass or deformity.  Abdominal:     General: There is no distension.     Palpations: Abdomen is soft.  Musculoskeletal: Normal range of motion.  Lymphadenopathy:     Cervical: No cervical adenopathy.  Skin:    General: Skin is warm and dry.  Neurological:     General: No focal deficit present.     Mental Status: She is alert.  Psychiatric:        Mood and Affect: Mood normal.        Behavior: Behavior normal.      UC Treatments / Results  Labs (all labs ordered are listed, but only abnormal results are displayed) Labs Reviewed - No data to display  EKG None  Radiology Dg Chest 2 View  Result Date: 10/21/2018 CLINICAL DATA:  Fatigue, body aches, and cough for the past 3 days. EXAM: CHEST - 2 VIEW COMPARISON:  Chest x-ray dated October 24, 2007. FINDINGS: The heart size and mediastinal contours are within normal limits. Normal pulmonary vascularity. Unchanged scarring in the left lower lobe. No focal consolidation, pleural effusion, or pneumothorax. No  acute osseous abnormality. IMPRESSION: No active cardiopulmonary disease. Electronically Signed   By: Titus Dubin M.D.   On: 10/21/2018 18:57    Procedures Procedures (including critical care time)  Medications Ordered in UC Medications - No data to display  Initial Impression / Assessment and Plan / UC Course  I  have reviewed the triage vital signs and the nursing notes.  Pertinent labs & imaging results that were available during my care of the patient were reviewed by me and considered in my medical decision making (see chart for details).     Patient has an upper respiratory infection.  I see no indication for antibiotics.  I described this to her that this is a viral infection, possibly a mild influenza-like illness however she has had symptoms for several days to is out of the window for Tamiflu.  We will treat her symptomatically and have her re-return if she gets worse instead of better Final Clinical Impressions(s) / UC Diagnoses   Final diagnoses:  Lower urinary tract infectious disease     Discharge Instructions     Plenty of rest Push fluids Take both the Tessalon and the dextromethorphan twice a day for cough use humidifier if you have one Return if not better in a few days On Monday, call community health and wellness to set up a primary care appointment for your ongoing medical care and medicine refills    ED Prescriptions    Medication Sig Dispense Auth. Provider   benzonatate (TESSALON) 200 MG capsule Take 1 capsule (200 mg total) by mouth 2 (two) times daily as needed for cough. 20 capsule Raylene Everts, MD   dextromethorphan-guaiFENesin [facility]) 30-600 MG 12hr tablet Take 1 tablet by mouth 2 (two) times daily. 20 tablet Raylene Everts, MD     Controlled Substance Prescriptions [place] Controlled Substance Registry consulted? Not Applicable   Raylene Everts, MD 10/21/18 Einar Crow

## 2018-10-21 NOTE — Discharge Instructions (Addendum)
Plenty of rest Push fluids Take both the Tessalon and the dextromethorphan twice a day for cough use humidifier if you have one Return if not better in a few days On Monday, call community health and wellness to set up a primary care appointment for your ongoing medical care and medicine refills

## 2018-10-21 NOTE — Telephone Encounter (Signed)
Patient requested that her medications be sent to the [place] [facility].

## 2018-10-21 NOTE — ED Triage Notes (Signed)
Pt present Shortness of Breath, Fatigue and loss of appetite. Symptoms started on  Tuesday.  Pt has tried OTC medication no relief.

## 2019-02-27 ENCOUNTER — Other Ambulatory Visit: Payer: Self-pay

## 2019-02-27 ENCOUNTER — Encounter (HOSPITAL_COMMUNITY): Payer: Self-pay | Admitting: Emergency Medicine

## 2019-02-27 ENCOUNTER — Ambulatory Visit (HOSPITAL_COMMUNITY)
Admission: EM | Admit: 2019-02-27 | Discharge: 2019-02-27 | Disposition: A | Discharge status: Home / Self Care | Payer: Self-pay | Attending: Family Medicine | Admitting: Family Medicine

## 2019-02-27 DIAGNOSIS — Z760 Encounter for issue of repeat prescription: Secondary | ICD-10-CM

## 2019-02-27 DIAGNOSIS — I150 Renovascular hypertension: Secondary | ICD-10-CM

## 2019-02-27 DIAGNOSIS — I10 Essential (primary) hypertension: Secondary | ICD-10-CM

## 2019-02-27 MED ORDER — AMLODIPINE BESYLATE 10 MG PO TABS: 10.0000 mg | ORAL_TABLET | Freq: Every day | ORAL | 0 refills | Status: DC

## 2019-02-27 MED ORDER — CLONIDINE HCL 0.1 MG PO TABS: 0.1000 mg | ORAL_TABLET | Freq: Three times a day (TID) | ORAL | 0 refills | Status: DC

## 2019-02-27 MED FILL — CloNIDine HCL 0.1 MG TAB: 0.1 | 180 days supply | Qty: 180 | Fill #0

## 2019-02-27 MED FILL — AMLODIPINE BESYLATE 10 MG T: 10 | 60 days supply | Qty: 60 | Fill #0

## 2019-02-27 NOTE — ED Triage Notes (Signed)
Patient is at [facility]cc for a medication refill.  Unable to get appt with pcp. Patient took last dose of clonidine this morning.  Patient is running low on other medications.

## 2019-02-27 NOTE — Discharge Instructions (Signed)
I refilled your BP medicines. Please be sure to take blood pressure medications as prescribed. Please monitor your blood pressure at home. Please follow up with your primary care doctor to recheck blood pressure and discuss any need for medication changes.   Please go to Emergency Room if you start to experience severe headache, vision changes, decreased urine production, chest pain, shortness of breath, speech slurring, one sided weakness.

## 2019-02-27 NOTE — ED Provider Notes (Signed)
[place]    CSN: [id number] Arrival date & time: 02/27/19  5697      History   Chief Complaint Chief Complaint  Patient presents with  . Medication Refill  . Appointment    9:50    HPI Rebecca Marquez is a 58 y.o. female history of hypertension, hyperlipidemia, CKD, presenting today for medication refill of her blood pressure medicines.  Patient states that she has 1 more tablet of clonidine left.  Patient is worried about running out of this as she often feels withdrawal side effects from clonidine when she does not take it.  She states that her head feels foggy.  She denies headaches.  Denies vision changes.  Denies chest pain or shortness of breath.  Denies difficulty speaking or any weakness.  She denies any issues with her medicines.  Patient has been unable to primary care due to current COVID pandemic.  HPI  Past Medical History:  Diagnosis Date  . Hypertension Dx March 2015  . Kidney disease   . Renal disorder    R/T HTN  . Seasonal allergies     Patient Active Problem List   Diagnosis Date Noted  . 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 Transient vision disturbance of both eyes 09/25/2016  . Poor dentition 02/06/2016  . Glossitis 04/29/2015  . HTN (hypertension) 12/11/2013  . HLD (hyperlipidemia) 11/16/2013  . CKD (chronic kidney disease) stage 4, GFR 15-29 ml/min (HCC) 11/16/2013    Past Surgical History:  Procedure Laterality Date  . TUBAL LIGATION      OB History   No obstetric history on file.      Home Medications    Prior to Admission medications   Medication Sig Start Date End Date Taking? Authorizing Provider  amLODipine (NORVASC) 10 MG tablet Take 1 tablet (10 mg total) by mouth daily. 02/27/19   Vernal Hritz C, PA-C  cloNIDine (CATAPRES) 0.1 MG tablet Take 1 tablet (0.1 mg total) by mouth 3 (three) times daily. 02/27/19   Zanasia Hickson C, PA-C  cetirizine (ZYRTEC) 5 MG tablet Take 1 tablet (5 mg total) by mouth daily. 06/27/18  02/27/19  Loura Halt A, NP  fluticasone (FLONASE) 50 MCG/ACT nasal spray Place 1 spray into both nostrils daily. 10/21/18 02/27/19  Raylene Everts, MD    Family History Family History  Problem Relation Age of Onset  . Diabetes Mother   . Arthritis Mother   . Multiple sclerosis Daughter 23    Social History Social History   Tobacco Use  . Smoking status: Former Smoker    Packs/day: 0.50    Years: 20.00    Pack years: 10.00    Types: Cigarettes    Quit date: 08/31/2001    Years since quitting: 17.5  . Smokeless tobacco: Never Used  Substance Use Topics  . Alcohol use: Yes  . Drug use: Yes    Types: Marijuana    Comment: last used 04/28/2015     Allergies   Nsaids and Losartan   Review of Systems Review of Systems  Constitutional: Negative for fatigue and fever.  HENT: Negative for congestion, sinus pressure and sore throat.   Eyes: Negative for photophobia, pain and visual disturbance.  Respiratory: Negative for cough and shortness of breath.   Cardiovascular: Negative for chest pain.  Gastrointestinal: Negative for abdominal pain, nausea and vomiting.  Genitourinary: Negative for decreased urine volume and hematuria.  Musculoskeletal: Negative for myalgias, neck pain and neck stiffness.  Neurological: Negative for dizziness, syncope,  facial asymmetry, speech difficulty, weakness, light-headedness, numbness and headaches.     Physical Exam Triage Vital Signs ED Triage Vitals  Enc Vitals Group     BP 02/27/19 1022 (!) 137/91     Pulse Rate 02/27/19 1022 85     Resp 02/27/19 1022 18     Temp 02/27/19 1022 98.7 F (37.1 C)     Temp Source 02/27/19 1022 Oral     SpO2 02/27/19 1022 100 %     Weight --      Height --      Head Circumference --      Peak Flow --      Pain Score 02/27/19 1018 0     Pain Loc --      Pain Edu? --      Excl. in [place]? --    No data found.  Updated Vital Signs BP (!) 137/91 (BP Location: Right Arm) Comment: small adult cuff,  has taken blood pressure medicine today  Pulse 85   Temp 98.7 F (37.1 C) (Oral)   Resp 18   SpO2 100%   Visual Acuity Right Eye Distance:   Left Eye Distance:   Bilateral Distance:    Right Eye Near:   Left Eye Near:    Bilateral Near:     Physical Exam Vitals signs and nursing note reviewed.  Constitutional:      General: She is not in acute distress.    Appearance: She is well-developed.  HENT:     Head: Normocephalic and atraumatic.  Eyes:     Conjunctiva/sclera: Conjunctivae normal.  Neck:     Musculoskeletal: Neck supple.  Cardiovascular:     Rate and Rhythm: Normal rate and regular rhythm.     Heart sounds: No murmur.     Comments: No carotid bruits auscultated Pulmonary:     Effort: Pulmonary effort is normal. No respiratory distress.     Breath sounds: Normal breath sounds.     Comments: Breathing comfortably at rest, CTABL, no wheezing, rales or other adventitious sounds auscultated Abdominal:     Palpations: Abdomen is soft.     Tenderness: There is no abdominal tenderness.  Skin:    General: Skin is warm and dry.  Neurological:     Mental Status: She is alert.      UC Treatments / Results  Labs (all labs ordered are listed, but only abnormal results are displayed) Labs Reviewed - No data to display  EKG None  Radiology No results found.  Procedures Procedures (including critical care time)  Medications Ordered in UC Medications - No data to display  Initial Impression / Assessment and Plan / UC Course  I have reviewed the triage vital signs and the nursing notes.  Pertinent labs & imaging results that were available during my care of the patient were reviewed by me and considered in my medical decision making (see chart for details).     We will refill patient's clonidine and amlodipine.  Currently asymptomatic, denies issues with medicines.  Continue to stress importance of primary care with managing blood pressure as she has been  here multiple times in the past for refills as well.  Currently asymptomatic.  Continue to monitor blood pressure at home.  Discussed warning signs.Discussed strict return precautions. Patient verbalized understanding and is agreeable with plan.  Final Clinical Impressions(s) / UC Diagnoses   Final diagnoses:  Medication refill  Essential hypertension     Discharge Instructions  I refilled your BP medicines. Please be sure to take blood pressure medications as prescribed. Please monitor your blood pressure at home. Please follow up with your primary care doctor to recheck blood pressure and discuss any need for medication changes.   Please go to Emergency Room if you start to experience severe headache, vision changes, decreased urine production, chest pain, shortness of breath, speech slurring, one sided weakness.   ED Prescriptions    Medication Sig Dispense Auth. Provider   amLODipine (NORVASC) 10 MG tablet  (Status: Discontinued) Take 1 tablet (10 mg total) by mouth daily. 60 tablet Jeyli Zwicker C, PA-C   cloNIDine (CATAPRES) 0.1 MG tablet  (Status: Discontinued) Take 1 tablet (0.1 mg total) by mouth 3 (three) times daily. 180 tablet Tanaisha Pittman C, PA-C   amLODipine (NORVASC) 10 MG tablet Take 1 tablet (10 mg total) by mouth daily. 60 tablet Jacara Benito C, PA-C   cloNIDine (CATAPRES) 0.1 MG tablet Take 1 tablet (0.1 mg total) by mouth 3 (three) times daily. 180 tablet Andromeda Poppen, Sunshine C, PA-C     Controlled Substance Prescriptions [place] Controlled Substance Registry consulted? Not Applicable   Janith Lima, [place] 02/27/19 1900

## 2019-04-27 ENCOUNTER — Ambulatory Visit (INDEPENDENT_AMBULATORY_CARE_PROVIDER_SITE_OTHER)
Admission: RE | Admit: 2019-04-27 | Discharge: 2019-04-27 | Disposition: A | Discharge status: Home / Self Care | Payer: Self-pay | Source: Ambulatory Visit

## 2019-04-27 ENCOUNTER — Other Ambulatory Visit: Payer: Self-pay

## 2019-04-27 DIAGNOSIS — Z760 Encounter for issue of repeat prescription: Secondary | ICD-10-CM

## 2019-04-27 DIAGNOSIS — I150 Renovascular hypertension: Secondary | ICD-10-CM

## 2019-04-27 DIAGNOSIS — I10 Essential (primary) hypertension: Secondary | ICD-10-CM

## 2019-04-27 MED ORDER — AMLODIPINE BESYLATE 10 MG PO TABS: 10.0000 mg | ORAL_TABLET | Freq: Every day | ORAL | 1 refills | Status: DC

## 2019-04-27 MED ORDER — CLONIDINE HCL 0.1 MG PO TABS: 0.1000 mg | ORAL_TABLET | Freq: Three times a day (TID) | ORAL | 1 refills | Status: DC

## 2019-04-27 NOTE — ED Provider Notes (Signed)
[place]  Virtual Visit via Video Note:  Rebecca Marquez  initiated request for Telemedicine visit with [facility] team. I connected with Rebecca Marquez  on 04/27/2019 at 12:44 PM  for a synchronized telemedicine visit using a video enabled HIPPA compliant telemedicine application. I verified that I am speaking with Rebecca Marquez  using two identifiers. Lestine Box, PA-C  was physically located in a [facility]gent care site and Carrell Palmatier was located at a different location.   The limitations of evaluation and management by telemedicine as well as the availability of in-person appointments were discussed. Patient was informed that she  may incur a bill ( including co-pay) for this virtual visit encounter. Rebecca Marquez  expressed understanding and gave verbal consent to proceed with virtual visit.  [id number] 04/27/19 Arrival Time: 34  CC: Medication refill  SUBJECTIVE: History from: patient.  Rebecca Marquez is a 58 y.o. female hx significant for HTN, kidney disease, and renal disorder, who presents for blood pressure medication refill.  Hx of HTN x 4 years.  States blood pressure on average is 130's/80-90's.  Takes norvasc 10 mg daily and clonidine 0.1 mg three times daily.  Well controlled on current medications.  Currently does not have a PCP.  Denies HA, vision changes, dizziness, lightheadedness, chest pain, shortness of breath, numbness or tingling in extremities, abdominal pain, changes in bowel or bladder habits.     ROS: As per HPI.  All other pertinent ROS negative.     Past Medical History:  Diagnosis Date  . Hypertension Dx March 2015  . Kidney disease   . Renal disorder    R/T HTN  . Seasonal allergies    Past Surgical History:  Procedure Laterality Date  . TUBAL LIGATION     Allergies  Allergen Reactions  . Nsaids Other (See Comments)    ckd  . Losartan Tinitus   No current facility-administered medications on file  prior to encounter.    Current Outpatient Medications on File Prior to Encounter  Medication Sig Dispense Refill  . [DISCONTINUED] cetirizine (ZYRTEC) 5 MG tablet Take 1 tablet (5 mg total) by mouth daily. 30 tablet 1  . [DISCONTINUED] fluticasone (FLONASE) 50 MCG/ACT nasal spray Place 1 spray into both nostrils daily. 16 g 2    OBJECTIVE:  There were no vitals filed for this visit.  General appearance: alert; no distress Eyes: EOMI grossly; wears corrective lenses HENT: normocephalic; atraumatic Neck: supple with FROM Lungs: normal respiratory effort; speaking in full sentences without difficulty Extremities: moves extremities without difficulty Skin: No obvious rashes Neurologic: No facial asymmetries Psychological: alert and cooperative; normal mood and affect  ASSESSMENT & PLAN:  1. Essential hypertension   2. Renovascular hypertension   3. Medication refill     Meds ordered this encounter  Medications  . amLODipine (NORVASC) 10 MG tablet    Sig: Take 1 tablet (10 mg total) by mouth daily.    Dispense:  60 tablet    Refill:  1    Must have office visit for refills    Order Specific Question:   Supervising Provider    Answer:   Raylene Everts [[id number]]  . cloNIDine (CATAPRES) 0.1 MG tablet    Sig: Take 1 tablet (0.1 mg total) by mouth 3 (three) times daily.    Dispense:  180 tablet    Refill:  1    Must have office visit for refills  Order Specific Question:   Supervising Provider    Answer:   Raylene Everts [[id number]]   Blood pressure medications refilled Please continue to monitor blood pressure at home and keep a log Eat a well balanced diet of fruits, vegetables and lean meats.  Avoid foods high in fat and salt Drink water.  At least half your body weight in ounces Exercise for at least 30 minutes daily Follow up with [facility] for further evaluation and management of high blood pressure Return or go to the ED if you have any new  or worsening symptoms such as vision changes, fatigue, dizziness, chest pain, shortness of breath, nausea, swelling in your hands or feet, urinary symptoms, etc...  I discussed the assessment and treatment plan with the patient. The patient was provided an opportunity to ask questions and all were answered. The patient agreed with the plan and demonstrated an understanding of the instructions.   The patient was advised to call back or seek an in-person evaluation if the symptoms worsen or if the condition fails to improve as anticipated.  I provided 10 minutes of non-face-to-face time during this encounter.  Lestine Box, PA-C  04/27/2019 12:44 PM      Lestine Box, PA-C 04/27/19 1247

## 2019-04-27 NOTE — Discharge Instructions (Signed)
Blood pressure medications refilled Please continue to monitor blood pressure at home and keep a log Eat a well balanced diet of fruits, vegetables and lean meats.  Avoid foods high in fat and salt Drink water.  At least half your body weight in ounces Exercise for at least 30 minutes daily Follow up with [facility] Medicine for further evaluation and management of high blood pressure Return or go to the ED if you have any new or worsening symptoms such as vision changes, fatigue, dizziness, chest pain, shortness of breath, nausea, swelling in your hands or feet, urinary symptoms, etc..Marland Kitchen

## 2019-08-31 ENCOUNTER — Inpatient Hospital Stay
Admission: RE | Admit: 2019-08-31 | Discharge: 2019-08-31 | Disposition: A | Discharge status: Home / Self Care | Payer: Self-pay | Source: Ambulatory Visit

## 2019-09-03 ENCOUNTER — Ambulatory Visit (HOSPITAL_COMMUNITY)
Admission: EM | Admit: 2019-09-03 | Discharge: 2019-09-03 | Disposition: A | Discharge status: Home / Self Care | Payer: Medicaid Other | Attending: Family Medicine | Admitting: Family Medicine

## 2019-09-03 ENCOUNTER — Other Ambulatory Visit: Payer: Self-pay

## 2019-09-03 ENCOUNTER — Encounter (HOSPITAL_COMMUNITY): Payer: Self-pay

## 2019-09-03 DIAGNOSIS — Z760 Encounter for issue of repeat prescription: Secondary | ICD-10-CM

## 2019-09-03 DIAGNOSIS — I150 Renovascular hypertension: Secondary | ICD-10-CM

## 2019-09-03 DIAGNOSIS — N184 Chronic kidney disease, stage 4 (severe): Secondary | ICD-10-CM

## 2019-09-03 MED ORDER — CLONIDINE HCL 0.1 MG PO TABS: 0.1000 mg | ORAL_TABLET | Freq: Three times a day (TID) | ORAL | 0 refills | Status: DC

## 2019-09-03 NOTE — Discharge Instructions (Addendum)
Call the both Family Medicine provider and [facility] as you need close follow-up for chronic kidney disease and hypertension management. Both office contact information are listed above. You medication has been refilled for 30 days to allow time you to secure an appointment with a provider.

## 2019-09-03 NOTE — ED Provider Notes (Addendum)
[place]    CSN: [id number] Arrival date & time: 09/03/19  1145      History   Chief Complaint No chief complaint on file.   HPI Rebecca Marquez is a 59 y.o. female.   HPI  Patient presents today for refill of clonidine. Patient is without primary care provider. She has utilized urgent care over the course of the last year and a half for routine medication refills for management of high blood pressure. Patient has advanced stage IV renal disease and is not currently being managed by nephrology. Patient is hypertensive on arrival today. She is scheduled for an appointment with internal medicine in February. Asymptomatic of headache, dizziness, chest pain, shortness of breath, weakness, numbness, or tingling. Past Medical History:  Diagnosis Date  . Hypertension Dx March 2015  . Kidney disease   . Renal disorder    R/T HTN  . Seasonal allergies     Patient Active Problem List   Diagnosis Date Noted  . 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 Transient vision disturbance of both eyes 09/25/2016  . Poor dentition 02/06/2016  . Glossitis 04/29/2015  . HTN (hypertension) 12/11/2013  . HLD (hyperlipidemia) 11/16/2013  . CKD (chronic kidney disease) stage 4, GFR 15-29 ml/min (HCC) 11/16/2013    Past Surgical History:  Procedure Laterality Date  . TUBAL LIGATION      Home Medications    Prior to Admission medications   Medication Sig Start Date End Date Taking? Authorizing Provider  amLODipine (NORVASC) 10 MG tablet Take 1 tablet (10 mg total) by mouth daily. 04/27/19   Wurst, [place], PA-C  cloNIDine (CATAPRES) 0.1 MG tablet Take 1 tablet (0.1 mg total) by mouth 3 (three) times daily. 04/27/19   Wurst, [place], PA-C  cetirizine (ZYRTEC) 5 MG tablet Take 1 tablet (5 mg total) by mouth daily. 06/27/18 02/27/19  Loura Halt A, NP  fluticasone (FLONASE) 50 MCG/ACT nasal spray Place 1 spray into both nostrils daily. 10/21/18 02/27/19  Raylene Everts, MD    Family  History Family History  Problem Relation Age of Onset  . Diabetes Mother   . Arthritis Mother   . Multiple sclerosis Daughter 81    Social History Social History   Tobacco Use  . Smoking status: Former Smoker    Packs/day: 0.50    Years: 20.00    Pack years: 10.00    Types: Cigarettes    Quit date: 08/31/2001    Years since quitting: 18.0  . Smokeless tobacco: Never Used  Substance Use Topics  . Alcohol use: Yes  . Drug use: Yes    Types: Marijuana    Comment: last used 04/28/2015     Allergies   Nsaids and Losartan  Review of Systems Review of Systems Pertinent negatives listed in HPI  Physical Exam Updated Vital Signs BP (!) 150/100 (BP Location: Left Arm)   Pulse 80   Temp 98.1 F (36.7 C) (Oral)   Resp 16   Wt 125 lb (56.7 kg)   SpO2 99%   BMI 21.80 kg/m   Visual Acuity Right Eye Distance:   Left Eye Distance:   Bilateral Distance:    Right Eye Near:   Left Eye Near:    Bilateral Near:     Physical Exam General appearance: alert, well developed, well nourished, cooperative and in no distress Head: Normocephalic, without obvious abnormality, atraumatic Respiratory: Respirations even and unlabored, normal respiratory rate Heart: rate and rhythm normal. No gallop or murmurs noted on exam  Extremities: No gross deformities or edema Skin: Skin color, texture, turgor normal. No rashes seen  Psych: Appropriate mood and affect. Neurologic: Mental status: Alert, oriented to person, place, and time, thought content appropriate.  UC Treatments / Results  Labs (all labs ordered are listed, but only abnormal results are displayed) Labs Reviewed - No data to display  EKG   Radiology No results found.  Procedures Procedures (including critical care time)  Medications Ordered in UC Medications - No data to display  Initial Impression / Assessment and Plan / UC Course  I have reviewed the triage vital signs and the nursing notes.  Pertinent labs  & imaging results that were available during my care of the patient were reviewed by me and considered in my medical decision making (see chart for details).   Medication Refill, without PCP.  Patient given information to follow-up to get established with a primary care provider along with information to establish with [facility] as she has advanced renal disease and is currently not been followed or managed.  Reemphasized the importance of follow-up to reduce the risk for progression of chronic diseases.  Final Clinical Impressions(s) / UC Diagnoses   Final diagnoses:  Medication refill  Renovascular hypertension  Stage 4 chronic kidney disease [facility])     Discharge Instructions     Call the both Family Medicine provider and [facility] as you need close follow-up for chronic kidney disease and hypertension management. Both office contact information are listed above. You medication has been refilled for 30 days to allow time you to secure an appointment with a provider.     ED Prescriptions    Medication Sig Dispense Auth. Provider   cloNIDine (CATAPRES) 0.1 MG tablet Take 1 tablet (0.1 mg total) by mouth 3 (three) times daily. 90 tablet Scot Jun, FNP     PDMP not reviewed this encounter.   Scot Jun, FNP 09/05/19 [place], [place], FNP 09/05/19 [phone]

## 2019-09-03 NOTE — ED Triage Notes (Signed)
Pt states she needs a a refill on her B/p med. Clondin 0.1 mg .

## 2019-10-04 ENCOUNTER — Ambulatory Visit (INDEPENDENT_AMBULATORY_CARE_PROVIDER_SITE_OTHER)
Admission: RE | Admit: 2019-10-04 | Discharge: 2019-10-04 | Disposition: A | Discharge status: Home / Self Care | Payer: Medicaid Other | Source: Ambulatory Visit

## 2019-10-04 DIAGNOSIS — Z760 Encounter for issue of repeat prescription: Secondary | ICD-10-CM

## 2019-10-04 DIAGNOSIS — I10 Essential (primary) hypertension: Secondary | ICD-10-CM

## 2019-10-04 MED ORDER — CLONIDINE HCL 0.1 MG PO TABS: 0.1000 mg | ORAL_TABLET | Freq: Three times a day (TID) | ORAL | 0 refills | Status: DC

## 2019-10-04 NOTE — Discharge Instructions (Addendum)
Take medication as prescribed Follow-up with primary care for more refill Go to ED for worsening of symptoms

## 2019-10-04 NOTE — ED Provider Notes (Signed)
Virtual Visit via Video Note:  Rebecca Marquez  initiated request for Telemedicine visit with [facility] team. I connected with [name]  on 10/04/2019 at 8:40 AM  for a synchronized telemedicine visit using a video enabled HIPPA compliant telemedicine application. I verified that I am speaking with [name]  using two identifiers. [name], FNP  was physically located in a [facility] Urgent care site and [name] was located at a different location.   The limitations of evaluation and management by telemedicine as well as the availability of in-person appointments were discussed. Patient was informed that she  may incur a bill ( including co-pay) for this virtual visit encounter. [name]  expressed understanding and gave verbal consent to proceed with virtual visit.     History of Present Illness:Rebecca Marquez  is a 59 y.o. female presents with complaint of medication refill.  Patient stated she has an [facility] coming up [facility].  She ran out of her clonidine 0.1 mg on 10/02/2019.  She want her medication to be refilled.  Denies chest pain chest tightness, confusion, headache.  Past Medical History:  Diagnosis Date  . Hypertension Dx March 2015  . Kidney disease   . Renal disorder    R/T HTN  . Seasonal allergies     Allergies  Allergen Reactions  . Nsaids Other (See Comments)    ckd  . Losartan Tinitus        Observations/Objective:VITALS: Per patient if applicable, see vitals. GENERAL: Alert, appears well and in no acute distress. HEENT: Atraumatic, conjunctiva clear, no obvious abnormalities on inspection of external nose and ears. NECK: Normal movements of the head and neck. CARDIOPULMONARY: No increased WOB. Speaking in clear sentences. I:E ratio WNL.  MS: Moves all visible extremities without noticeable abnormality. PSYCH: Pleasant and cooperative, well-groomed. Speech normal rate and  rhythm. Affect is appropriate. Insight and judgement are appropriate. Attention is focused, linear, and appropriate.  NEURO: CN grossly intact. Oriented as arrived to appointment on time with no prompting. Moves both UE equally.  SKIN: No obvious lesions, wounds, erythema, or cyanosis noted on face or hands.     Assessment and Plan: Hypertension medication will be refilled 10 tablets of clonidine will be prescribed Advised patient to keep her appointment and follow-up with her primary care tomorrow for more refill.  Follow Up Instructions: Follow-up with primary care ER follow-up instruction was given  I discussed the assessment and treatment plan with the patient. The patient was provided an opportunity to ask questions and all were answered. The patient agreed with the plan and demonstrated an understanding of the instructions.   The patient was advised to call back or seek an in-person evaluation if the symptoms worsen or if the condition fails to improve as anticipated.  I provided 15 minutes of non-face-to-face time during this encounter.    [name], FNP  10/04/2019 8:40 AM         [name], FNP 10/04/19 [phone]

## 2019-10-05 ENCOUNTER — Encounter: Payer: Self-pay | Admitting: Family Medicine

## 2019-10-05 ENCOUNTER — Ambulatory Visit: Payer: Self-pay | Attending: Family Medicine | Admitting: Family Medicine

## 2019-10-05 ENCOUNTER — Other Ambulatory Visit: Payer: Self-pay

## 2019-10-05 VITALS — BP 126/87 | HR 72 | Temp 98.3°F | Resp 16 | Ht 63.5 in | Wt 114.0 lb

## 2019-10-05 DIAGNOSIS — N1832 Chronic kidney disease, stage 3b: Secondary | ICD-10-CM

## 2019-10-05 DIAGNOSIS — I150 Renovascular hypertension: Secondary | ICD-10-CM

## 2019-10-05 DIAGNOSIS — Z7689 Persons encountering health services in other specified circumstances: Secondary | ICD-10-CM

## 2019-10-05 DIAGNOSIS — M79642 Pain in left hand: Secondary | ICD-10-CM

## 2019-10-05 MED ORDER — CLONIDINE HCL 0.1 MG PO TABS: 0.1000 mg | ORAL_TABLET | Freq: Three times a day (TID) | ORAL | 1 refills | Status: DC

## 2019-10-05 MED ORDER — AMLODIPINE BESYLATE 10 MG PO TABS: 10.0000 mg | ORAL_TABLET | Freq: Every day | ORAL | 1 refills | Status: DC

## 2019-10-05 NOTE — Patient Instructions (Signed)
Chronic Kidney Disease, Adult Chronic kidney disease (CKD) happens when the kidneys are damaged over a long period of time. The kidneys are two organs that help with:  Getting rid of waste and extra fluid from the blood.  Making hormones that maintain the amount of fluid in your tissues and blood vessels.  Making sure that the body has the right amount of fluids and chemicals. Most of the time, CKD does not go away, but it can usually be controlled. Steps must be taken to slow down the kidney damage or to stop it from getting worse. If this is not done, the kidneys may stop working. Follow these instructions at home: Medicines  Take over-the-counter and prescription medicines only as told by your doctor. You may need to change the amount of medicines you take.  Do not take any new medicines unless your doctor says it is okay. Many medicines can make your kidney damage worse.  Do not take any vitamin and supplements unless your doctor says it is okay. Many vitamins and supplements can make your kidney damage worse. General instructions  Follow a diet as told by your doctor. You may need to stay away from: ? Alcohol. ? Salty foods. ? Foods that are high in:  Potassium.  Calcium.  Protein.  Do not use any products that contain nicotine or tobacco, such as cigarettes and e-cigarettes. If you need help quitting, ask your doctor.  Keep track of your blood pressure at home. Tell your doctor about any changes.  If you have diabetes, keep track of your blood sugar as told by your doctor.  Try to stay at a healthy weight. If you need help, ask your doctor.  Exercise at least 30 minutes a day, 5 days a week.  Stay up-to-date with your shots (immunizations) as told by your doctor.  Keep all follow-up visits as told by your doctor. This is important. Contact a doctor if:  Your symptoms get worse.  You have new symptoms. Get help right away if:  You have symptoms of end-stage  kidney disease. These may include: ? Headaches. ? Numbness in your hands or feet. ? Easy bruising. ? Having hiccups often. ? Chest pain. ? Shortness of breath. ? Stopping of menstrual periods in women.  You have a fever.  You have very little pee (urine).  You have pain or bleeding when you pee. Summary  Chronic kidney disease (CKD) happens when the kidneys are damaged over a long period of time.  Most of the time, this condition does not go away, but it can usually be controlled. Steps must be taken to slow down the kidney damage or to stop it from getting worse.  Treatment may include a combination of medicines and lifestyle changes. This information is not intended to replace advice given to you by your health care provider. Make sure you discuss any questions you have with your health care provider. Document Revised: 07/30/2017 Document Reviewed: 09/21/2016 Elsevier Patient Education  2020 Elsevier Inc.  

## 2019-10-05 NOTE — Progress Notes (Signed)
Intermittent Left hand pain.  Concerns with arthritis

## 2019-10-05 NOTE — Progress Notes (Signed)
Subjective:  Patient ID: Rebecca Marquez, female    DOB: September 17, 1960  Age: 59 y.o. MRN: [id number]  CC: No chief complaint on file.   HPI Rebecca Marquez, 59 yo female, who presents to establish care. She has a history of hypertension and is s/p [facility] video visit yesterday for medication refill after running out of clonidine on 10/02/2019. On review of chart, last BMP  was done 09/12/2017 with BUN of 32 and creatinine of 2.0 with prior Cr done 07/20/2017 with  Cr of 1.76 and GFR of 37 and per review of chart, she has diagnosis of stage 4 CKD. Patient also with complaint of intermittent left hand pain and concern that she might have arthritis. She has increased pain when using her left hand repeatedly, sometimes pain in the right hand as well.. Pain is generally dull and aching.       She reports that her blood pressure has been well controlled on her current medications. No headaches or dizziness currently. No chest pain or palpitations. She is aware of her diagnosis of chronic kidney disease. She tries to avoid the use of NSAID pain medications.   Past Medical History:  Diagnosis Date  . Hypertension Dx March 2015  . Kidney disease   . Renal disorder    R/T HTN  . Seasonal allergies     Past Surgical History:  Procedure Laterality Date  . TUBAL LIGATION      Family History  Problem Relation Age of Onset  . Diabetes Mother   . Arthritis Mother   . Multiple sclerosis Daughter 26    Social History   Tobacco Use  . Smoking status: Former Smoker    Packs/day: 0.50    Years: 20.00    Pack years: 10.00    Types: Cigarettes    Quit date: 08/31/2001    Years since quitting: 18.1  . Smokeless tobacco: Never Used  Substance Use Topics  . Alcohol use: Yes    ROS Review of Systems  Constitutional: Positive for fatigue (occasional). Negative for chills and fever.  HENT: Negative for sore throat and trouble swallowing.   Eyes: Negative for photophobia and visual  disturbance.  Respiratory: Negative for cough and shortness of breath.   Cardiovascular: Negative for chest pain, palpitations and leg swelling.  Gastrointestinal: Negative for abdominal pain, blood in stool, constipation, diarrhea and nausea.  Endocrine: Negative for polydipsia, polyphagia and polyuria.  Genitourinary: Negative for dysuria, flank pain, frequency and hematuria.  Musculoskeletal: Positive for arthralgias (hands, left greater than right) and back pain (occasional). Negative for gait problem and joint swelling.  Neurological: Negative for dizziness and headaches.  Hematological: Negative for adenopathy. Does not bruise/bleed easily.    Objective:   Today's Vitals:  BP 126/87   Pulse 72   Temp 98.3 F (36.8 C)   Resp 16   Ht 5' 3.5" (1.613 m)   Wt 114 lb (51.7 kg)   SpO2 99%   BMI 19.88 kg/m   Physical Exam Vitals and nursing note reviewed.  Constitutional:      General: She is not in acute distress.    Appearance: Normal appearance.     Comments: Thin framed female in NAD, wearing face mask per office protocol  Neck:     Vascular: No carotid bruit.  Cardiovascular:     Rate and Rhythm: Normal rate and regular rhythm.  Pulmonary:     Effort: Pulmonary effort is normal.     Breath sounds:  Normal breath sounds.  Abdominal:     Palpations: Abdomen is soft.     Tenderness: There is no abdominal tenderness. There is no right CVA tenderness, left CVA tenderness, guarding or rebound.  Musculoskeletal:        General: Deformity (mild lumbar scoliosis) present. No tenderness.     Cervical back: Normal range of motion and neck supple. No rigidity or tenderness.     Right lower leg: No edema.     Left lower leg: No edema.     Comments: Patient has nodularity of the PIP joints of the fingers, left greater than right  Lymphadenopathy:     Cervical: No cervical adenopathy.  Skin:    General: Skin is warm and dry.  Neurological:     General: No focal deficit present.      Mental Status: She is alert and oriented to person, place, and time.  Psychiatric:        Mood and Affect: Mood normal.        Behavior: Behavior normal.     Assessment & Plan:  1. Renovascular hypertension; 4. Encounter to establish care On review of chart, patient with prior diagnosis of renovascular hypertension. CT of the abdomen and pelvis done of 04/15/2014 with renal cortex scarring. Refills provided for clonidine and amlodipine. Importance of medication compliance and monitoring of blood pressure stressed due to her known chronic kidney disease. - cloNIDine (CATAPRES) 0.1 MG tablet; Take 1 tablet (0.1 mg total) by mouth 3 (three) times daily.  Dispense: 270 tablet; Refill: 1 - amLODipine (NORVASC) 10 MG tablet; Take 1 tablet (10 mg total) by mouth daily.  Dispense: 90 tablet; Refill: 1  2. CKD stage G3b/A1, GFR 30-44 and albumin creatinine ratio <30 mg/g She will have BMP and CBC in follow-up of CKD and has been asked to avoid NSAID/ASA use and to be compliant with BP medication to ensure that her BP remains controlled. Remain hydrated as well. Nephrology referral may also be needed depending on lab results. - Basic Metabolic Panel - CBC  3. Hand pain, left Discussed with patient that she does have some nodularity of the PIP joints of the fingers left greater than right and I suspect that she osteoarthritis based on her exam. Order was also placed for her to obtain an X-ray of her left hand at her convenience. Due to her CKD she has been asked to avoid use of NSAID's for hand pain and try use of Tylenol arthritis. - DG Hand Complete Left; Future   Outpatient Encounter Medications as of 10/05/2019  Medication Sig  . amLODipine (NORVASC) 10 MG tablet Take 1 tablet (10 mg total) by mouth daily.  . cloNIDine (CATAPRES) 0.1 MG tablet Take 1 tablet (0.1 mg total) by mouth 3 (three) times daily.  . [DISCONTINUED] cetirizine (ZYRTEC) 5 MG tablet Take 1 tablet (5 mg total) by mouth daily.   . [DISCONTINUED] fluticasone (FLONASE) 50 MCG/ACT nasal spray Place 1 spray into both nostrils daily.   No facility-administered encounter medications on file as of 10/05/2019.    An After Visit Summary was printed and given to the patient.   Follow-up: Return in about 3 months (around 01/02/2020) for HTN/CKD.    Antony Blackbird MD

## 2019-10-06 LAB — CBC
Hematocrit: 42.7 % (ref 34.0–46.6)
Hemoglobin: 14.3 g/dL (ref 11.1–15.9)
MCH: 32.2 pg (ref 26.6–33.0)
MCHC: 33.5 g/dL (ref 31.5–35.7)
MCV: 96 fL (ref 79–97)
Platelets: 232 x10E3/uL (ref 150–450)
RBC: 4.44 x10E6/uL (ref 3.77–5.28)
RDW: 14.2 % (ref 11.7–15.4)
WBC: 4.5 x10E3/uL (ref 3.4–10.8)

## 2019-10-06 LAB — BASIC METABOLIC PANEL WITH GFR
BUN/Creatinine Ratio: 17 (ref 9–23)
BUN: 36 mg/dL — ABNORMAL HIGH (ref 6–24)
CO2: 18 mmol/L — ABNORMAL LOW (ref 20–29)
Calcium: 9.3 mg/dL (ref 8.7–10.2)
Chloride: 106 mmol/L (ref 96–106)
Creatinine, Ser: 2.15 mg/dL — ABNORMAL HIGH (ref 0.57–1.00)
GFR calc Af Amer: 29 mL/min/1.73 — ABNORMAL LOW
GFR calc non Af Amer: 25 mL/min/1.73 — ABNORMAL LOW
Glucose: 89 mg/dL (ref 65–99)
Potassium: 4.8 mmol/L (ref 3.5–5.2)
Sodium: 137 mmol/L (ref 134–144)

## 2019-10-07 DIAGNOSIS — M51369 Other intervertebral disc degeneration, lumbar region without mention of lumbar back pain or lower extremity pain: Secondary | ICD-10-CM | POA: Insufficient documentation

## 2019-10-07 DIAGNOSIS — M5136 Other intervertebral disc degeneration, lumbar region: Secondary | ICD-10-CM | POA: Insufficient documentation

## 2019-10-10 ENCOUNTER — Other Ambulatory Visit: Payer: Self-pay | Admitting: Family Medicine

## 2019-10-10 DIAGNOSIS — N1832 Chronic kidney disease, stage 3b: Secondary | ICD-10-CM

## 2020-01-03 ENCOUNTER — Other Ambulatory Visit: Payer: Self-pay

## 2020-01-03 ENCOUNTER — Encounter: Payer: Self-pay | Admitting: Family Medicine

## 2020-01-03 ENCOUNTER — Ambulatory Visit: Payer: Self-pay | Attending: Family Medicine | Admitting: Family Medicine

## 2020-01-03 VITALS — BP 128/90 | HR 80 | Temp 97.9°F | Ht 63.5 in | Wt 111.4 lb

## 2020-01-03 DIAGNOSIS — I150 Renovascular hypertension: Secondary | ICD-10-CM

## 2020-01-03 DIAGNOSIS — N1832 Chronic kidney disease, stage 3b: Secondary | ICD-10-CM

## 2020-01-03 DIAGNOSIS — E7800 Pure hypercholesterolemia, unspecified: Secondary | ICD-10-CM

## 2020-01-03 DIAGNOSIS — Z1231 Encounter for screening mammogram for malignant neoplasm of breast: Secondary | ICD-10-CM

## 2020-01-03 DIAGNOSIS — Z87898 Personal history of other specified conditions: Secondary | ICD-10-CM

## 2020-01-03 NOTE — Progress Notes (Signed)
3mo f/u

## 2020-01-03 NOTE — Progress Notes (Signed)
Established Patient Office Visit  Subjective:  Patient ID: Rebecca Marquez, female    DOB: 1961/07/09  Age: 59 y.o. MRN: [id number]  CC: Follow-up hypertension/chronic kidney disease  HPI Rebecca Marquez, 59 year old African-American female, who was last seen in the office on 10/05/2019 to establish care for ongoing treatment and management of renovascular hypertension, stage III chronic kidney disease and patient with complaint of left hand pain.  Her creatinine on 10/05/2019 was 2.15 with EGFR of 29.  No anemia on CBC.        She reports that she feels well at today's visit.  She is taking her blood pressure medication daily and blood pressure has been less than 140/90 when she has checked.  She denies headaches or dizziness related to her blood pressure.  She reports that she was not able to follow-up with nephrology after her last visit as when she contacted the office she was told that she could not be seen due to lack of insurance.  She denies any issues with excessive fatigue, no muscle cramping.  No peripheral edema.  She has had no chest pain or palpitations, no loss of appetite.  No urinary frequency, urgency or dysuria.  No hematuria.  No shortness of breath or cough.  She has had past elevated blood sugar but denies any current issues with blurred vision, no increased thirst or urinary frequency.  She has also had high cholesterol in the past but is not currently on cholesterol medication.  Past Medical History:  Diagnosis Date  . Hypertension Dx March 2015  . Kidney disease   . Renal disorder    R/T HTN  . Seasonal allergies     Past Surgical History:  Procedure Laterality Date  . TUBAL LIGATION      Family History  Problem Relation Age of Onset  . Diabetes Mother   . Arthritis Mother   . Multiple sclerosis Daughter 10    Social History   Socioeconomic History  . Marital status: Legally Separated    Spouse name: Not on file  . Number of children: Not on file  .  Years of education: Not on file  . Highest education level: Not on file  Occupational History  . Not on file  Tobacco Use  . Smoking status: Former Smoker    Packs/day: 0.50    Years: 20.00    Pack years: 10.00    Types: Cigarettes    Quit date: 08/31/2001    Years since quitting: 18.3  . Smokeless tobacco: Never Used  Substance and Sexual Activity  . Alcohol use: Yes    Alcohol/week: 1.0 standard drinks    Types: 1 Cans of beer per week  . Drug use: Yes    Types: Marijuana    Comment: last used 04/28/2015  . Sexual activity: Not on file  Other Topics Concern  . Not on file  Social History Narrative  . Not on file   Social Determinants of Health   Financial Resource Strain:   . Difficulty of Paying Living Expenses:   Food Insecurity:   . Worried About Charity fundraiser in the Last Year:   . Arboriculturist in the Last Year:   Transportation Needs:   . Film/video editor (Medical):   Marland Kitchen Lack of Transportation (Non-Medical):   Physical Activity:   . Days of Exercise per Week:   . Minutes of Exercise per Session:   Stress:   . Feeling of Stress :  Social Connections:   . Frequency of Communication with Friends and Family:   . Frequency of Social Gatherings with Friends and Family:   . Attends Religious Services:   . Active Member of Clubs or Organizations:   . Attends Archivist Meetings:   Marland Kitchen Marital Status:   Intimate Partner Violence:   . Fear of Current or Ex-Partner:   . Emotionally Abused:   Marland Kitchen Physically Abused:   . Sexually Abused:     Outpatient Medications Prior to Visit  Medication Sig Dispense Refill  . amLODipine (NORVASC) 10 MG tablet Take 1 tablet (10 mg total) by mouth daily. 90 tablet 1  . cloNIDine (CATAPRES) 0.1 MG tablet Take 1 tablet (0.1 mg total) by mouth 3 (three) times daily. 270 tablet 1   No facility-administered medications prior to visit.    Allergies  Allergen Reactions  . Nsaids Other (See Comments)    ckd  .  Losartan Tinitus    ROS Review of Systems  Constitutional: Positive for fatigue (mild). Negative for chills and fever.  HENT: Negative for sore throat and trouble swallowing.   Respiratory: Negative for cough and shortness of breath.   Cardiovascular: Negative for chest pain, palpitations and leg swelling.  Gastrointestinal: Negative for abdominal pain, blood in stool, constipation, diarrhea and nausea.  Endocrine: Negative for polydipsia, polyphagia and polyuria.  Genitourinary: Negative for dysuria and frequency.  Musculoskeletal: Negative for arthralgias and back pain.  Neurological: Negative for dizziness and headaches.  Hematological: Negative for adenopathy. Does not bruise/bleed easily.  Psychiatric/Behavioral: Negative for self-injury and suicidal ideas.      Objective:    Physical Exam  Constitutional: She is oriented to person, place, and time. She appears well-developed and well-nourished.  Thin framed older female in NAD wearing a mask as per office COVID-10 protocol  Neck: No JVD present.  Cardiovascular: Normal rate and regular rhythm.  Pulmonary/Chest: Effort normal and breath sounds normal.  Abdominal: Soft. There is no abdominal tenderness. There is no rebound and no guarding.  Musculoskeletal:        General: No tenderness or edema. Normal range of motion.     Cervical back: Normal range of motion and neck supple.  Lymphadenopathy:    She has no cervical adenopathy.  Neurological: She is alert and oriented to person, place, and time.  Skin: Skin is warm and dry.  Psychiatric: She has a normal mood and affect. Her behavior is normal.  Nursing note and vitals reviewed.   BP 128/90   Pulse 80   Temp 97.9 F (36.6 C) (Temporal)   Ht 5' 3.5" (1.613 m)   Wt 111 lb 6.4 oz (50.5 kg)   SpO2 98%   BMI 19.42 kg/m  Wt Readings from Last 3 Encounters:  01/03/20 111 lb 6.4 oz (50.5 kg)  10/05/19 114 lb (51.7 kg)  09/03/19 125 lb (56.7 kg)     Health  Maintenance Due  Topic Date Due  . COVID-19 Vaccine (1) Never done  . MAMMOGRAM  Never done  . COLONOSCOPY  Never done  . PAP SMEAR-Modifier  04/28/2018      Lab Results  Component Value Date   TSH 0.745 07/20/2017   Lab Results  Component Value Date   WBC 4.5 10/05/2019   HGB 14.3 10/05/2019   HCT 42.7 10/05/2019   MCV 96 10/05/2019   PLT 232 10/05/2019   Lab Results  Component Value Date   NA 137 10/05/2019   K 4.8 10/05/2019  CO2 18 (L) 10/05/2019   GLUCOSE 89 10/05/2019   BUN 36 (H) 10/05/2019   CREATININE 2.15 (H) 10/05/2019   BILITOT <0.2 07/20/2017   ALKPHOS 118 (H) 07/20/2017   AST 20 07/20/2017   ALT 13 07/20/2017   PROT 7.0 07/20/2017   ALBUMIN 3.6 07/20/2017   CALCIUM 9.3 10/05/2019   ANIONGAP 6 01/19/2017   Lab Results  Component Value Date   CHOL 198 09/25/2016   Lab Results  Component Value Date   HDL 50 (L) 09/25/2016   Lab Results  Component Value Date   LDLCALC 131 (H) 09/25/2016   Lab Results  Component Value Date   TRIG 85 09/25/2016   Lab Results  Component Value Date   CHOLHDL 4.0 09/25/2016   Lab Results  Component Value Date   HGBA1C 5.7 (H) 11/16/2013      Assessment & Plan:  1. Renovascular hypertension BP stable and controlled on current medications-continue clonidine and amlodipine. Renal panel and referral to Nephrology as per patient she was told by the office that she was referred to that she could not be seen due to lack of insurance. She is encouraged to make appointment with financial counselors here regarding [facility] discount program.  - Ambulatory referral to Nephrology - Renal Function Panel  2. CKD stage G3b/A1, GFR 30-44 and albumin creatinine ratio <30 mg/g Renal panel and new nephrology referral. Handout on CKD given at last visit - Ambulatory referral to Nephrology - Renal Function Panel  3. Pure hypercholesterolemia Prior lipid panel in 2018 with LDL of 130 and will obtain new lipid panel today.  Patient did have applesauce in order to eat something with her medications this morning.  - Lipid panel  4. History of prediabetes A1c of 5.7 in 2015 which will be repeated.  - Hemoglobin A1C  5. Encounter for screening mammogram for malignant neoplasm of breast Mammo order placed and patient provided with scholarship information. - MM Digital Screening; Future  An After Visit Summary was printed and given to the patient.   Follow-up: Return in about 4 months (around 05/05/2020) for HTN/CKD-sooner if needed.    Antony Blackbird, MD

## 2020-01-04 LAB — RENAL FUNCTION PANEL
Albumin: 4.1 g/dL (ref 3.8–4.9)
BUN/Creatinine Ratio: 12 (ref 9–23)
BUN: 22 mg/dL (ref 6–24)
CO2: 20 mmol/L (ref 20–29)
Calcium: 9.5 mg/dL (ref 8.7–10.2)
Chloride: 105 mmol/L (ref 96–106)
Creatinine, Ser: 1.88 mg/dL — ABNORMAL HIGH (ref 0.57–1.00)
GFR calc Af Amer: 34 mL/min/1.73 — ABNORMAL LOW
GFR calc non Af Amer: 29 mL/min/1.73 — ABNORMAL LOW
Glucose: 82 mg/dL (ref 65–99)
Phosphorus: 2.8 mg/dL — ABNORMAL LOW (ref 3.0–4.3)
Potassium: 4.4 mmol/L (ref 3.5–5.2)
Sodium: 142 mmol/L (ref 134–144)

## 2020-01-04 LAB — LIPID PANEL
Chol/HDL Ratio: 3.2 ratio (ref 0.0–4.4)
Cholesterol, Total: 206 mg/dL — ABNORMAL HIGH (ref 100–199)
HDL: 64 mg/dL
LDL Chol Calc (NIH): 121 mg/dL — ABNORMAL HIGH (ref 0–99)
Triglycerides: 120 mg/dL (ref 0–149)
VLDL Cholesterol Cal: 21 mg/dL (ref 5–40)

## 2020-01-04 LAB — HEMOGLOBIN A1C
Est. average glucose Bld gHb Est-mCnc: 114 mg/dL
Hgb A1c MFr Bld: 5.6 % (ref 4.8–5.6)

## 2020-01-09 ENCOUNTER — Other Ambulatory Visit: Payer: Self-pay | Admitting: Family Medicine

## 2020-01-15 ENCOUNTER — Ambulatory Visit: Payer: Medicaid Other

## 2020-02-22 ENCOUNTER — Encounter (HOSPITAL_COMMUNITY): Payer: Self-pay | Admitting: Emergency Medicine

## 2020-02-22 ENCOUNTER — Other Ambulatory Visit: Payer: Self-pay

## 2020-02-22 ENCOUNTER — Ambulatory Visit (HOSPITAL_COMMUNITY)
Admission: EM | Admit: 2020-02-22 | Discharge: 2020-02-22 | Disposition: A | Discharge status: Home / Self Care | Payer: Medicaid Other | Attending: Physician Assistant | Admitting: Physician Assistant

## 2020-02-22 DIAGNOSIS — T148XXA Other injury of unspecified body region, initial encounter: Secondary | ICD-10-CM

## 2020-02-22 MED ORDER — METHOCARBAMOL 500 MG PO TABS: 500.0000 mg | ORAL_TABLET | Freq: Four times a day (QID) | ORAL | 0 refills | Status: DC

## 2020-02-22 NOTE — Discharge Instructions (Signed)
See your Physician for recheck of your blood pressure and back pain

## 2020-02-22 NOTE — ED Triage Notes (Signed)
Pt c/o mid back pain onset Monday. Pt states she's been using biofreeze and it has not helped. Pt is unsure if she has any injury. She states years ago she pulled a muscle in her back picking up her daughter

## 2020-02-23 NOTE — ED Provider Notes (Signed)
[place]    CSN: [id number] Arrival date & time: 02/22/20  [place]      History   Chief Complaint Chief Complaint  Patient presents with  . Back Pain    HPI Rebecca Marquez is a 59 y.o. female.   The history is provided by the patient. No language interpreter was used.  Back Pain Location:  Thoracic spine Quality:  Aching Radiates to:  Does not radiate Pain severity:  Moderate Pain is:  Same all the time Onset quality:  Gradual Timing:  Constant Progression:  Worsening Chronicity:  New Relieved by:  Nothing Worsened by:  Nothing Ineffective treatments:  None tried Associated symptoms: no paresthesias     Past Medical History:  Diagnosis Date  . Hypertension Dx March 2015  . Kidney disease   . Renal disorder    R/T HTN  . Seasonal allergies     Patient Active Problem List   Diagnosis Date Noted  . Lumbar degenerative disc disease 10/07/2019  . 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 Transient vision disturbance of both eyes 09/25/2016  . Poor dentition 02/06/2016  . Glossitis 04/29/2015  . HTN (hypertension) 12/11/2013  . HLD (hyperlipidemia) 11/16/2013  . CKD (chronic kidney disease) stage 4, GFR 15-29 ml/min (HCC) 11/16/2013    Past Surgical History:  Procedure Laterality Date  . TUBAL LIGATION      OB History   No obstetric history on file.      Home Medications    Prior to Admission medications   Medication Sig Start Date End Date Taking? Authorizing Provider  amLODipine (NORVASC) 10 MG tablet Take 1 tablet (10 mg total) by mouth daily. 10/05/19  Yes Fulp, Cammie, MD  cloNIDine (CATAPRES) 0.1 MG tablet Take 1 tablet (0.1 mg total) by mouth 3 (three) times daily. 10/05/19  Yes Fulp, Cammie, MD  methocarbamol (ROBAXIN) 500 MG tablet Take 1 tablet (500 mg total) by mouth 4 (four) times daily. 02/22/20   Fransico Meadow, PA-C  cetirizine (ZYRTEC) 5 MG tablet Take 1 tablet (5 mg total) by mouth daily. 06/27/18 02/27/19  Loura Halt A, NP  fluticasone  (FLONASE) 50 MCG/ACT nasal spray Place 1 spray into both nostrils daily. 10/21/18 02/27/19  Raylene Everts, MD    Family History Family History  Problem Relation Age of Onset  . Diabetes Mother   . Arthritis Mother   . Multiple sclerosis Daughter 10    Social History Social History   Tobacco Use  . Smoking status: Former Smoker    Packs/day: 0.50    Years: 20.00    Pack years: 10.00    Types: Cigarettes    Quit date: 08/31/2001    Years since quitting: 18.4  . Smokeless tobacco: Never Used  Substance Use Topics  . Alcohol use: Yes    Alcohol/week: 1.0 standard drink    Types: 1 Cans of beer per week  . Drug use: Yes    Types: Marijuana    Comment: last used 04/28/2015     Allergies   Nsaids and Losartan   Review of Systems Review of Systems  Musculoskeletal: Positive for back pain.  Neurological: Negative for paresthesias.  All other systems reviewed and are negative.    Physical Exam Triage Vital Signs ED Triage Vitals  Enc Vitals Group     BP 02/22/20 1922 (!) 164/101     Pulse Rate 02/22/20 1922 61     Resp 02/22/20 1922 17     Temp 02/22/20 1922 99.3  F (37.4 C)     Temp Source 02/22/20 1922 Oral     SpO2 02/22/20 1922 99 %     Weight --      Height --      Head Circumference --      Peak Flow --      Pain Score 02/22/20 1920 3     Pain Loc --      Pain Edu? --      Excl. in [place]? --    No data found.  Updated Vital Signs BP (!) 164/101 (BP Location: Right Arm)   Pulse 61   Temp 99.3 F (37.4 C) (Oral)   Resp 17   SpO2 99%   Visual Acuity Right Eye Distance:   Left Eye Distance:   Bilateral Distance:    Right Eye Near:   Left Eye Near:    Bilateral Near:     Physical Exam Vitals and nursing note reviewed.  Constitutional:      Appearance: She is well-developed.  HENT:     Head: Normocephalic.  Cardiovascular:     Rate and Rhythm: Normal rate.  Pulmonary:     Effort: Pulmonary effort is normal.  Abdominal:     General:  There is no distension.  Musculoskeletal:        General: Normal range of motion.     Cervical back: Normal range of motion.  Neurological:     General: No focal deficit present.     Mental Status: She is alert and oriented to person, place, and time.  Psychiatric:        Mood and Affect: Mood normal.      UC Treatments / Results  Labs (all labs ordered are listed, but only abnormal results are displayed) Labs Reviewed - No data to display  EKG   Radiology No results found.  Procedures Procedures (including critical care time)  Medications Ordered in UC Medications - No data to display  Initial Impression / Assessment and Plan / UC Course  I have reviewed the triage vital signs and the nursing notes.  Pertinent labs & imaging results that were available during my care of the patient were reviewed by me and considered in my medical decision making (see chart for details).     MDM:  Pt has blood pressure medication with her.  Pt forgot to take.  Pt will take now.  Final Clinical Impressions(s) / UC Diagnoses   Final diagnoses:  Muscle strain     Discharge Instructions     See your Physician for recheck of your blood pressure and back pain    ED Prescriptions    Medication Sig Dispense Auth. Provider   methocarbamol (ROBAXIN) 500 MG tablet Take 1 tablet (500 mg total) by mouth 4 (four) times daily. 20 tablet Fransico Meadow, [place]     PDMP not reviewed this encounter.  An After Visit Summary was printed and given to the patient.    Fransico Meadow, [place] 02/23/20 [phone]

## 2020-04-19 ENCOUNTER — Other Ambulatory Visit: Payer: Self-pay | Admitting: Family Medicine

## 2020-04-19 DIAGNOSIS — I150 Renovascular hypertension: Secondary | ICD-10-CM

## 2020-04-19 MED ORDER — CLONIDINE HCL 0.1 MG PO TABS: 0.1000 mg | ORAL_TABLET | Freq: Three times a day (TID) | ORAL | 0 refills | Status: DC

## 2020-04-19 MED ORDER — AMLODIPINE BESYLATE 10 MG PO TABS: 10.0000 mg | ORAL_TABLET | Freq: Every day | ORAL | 0 refills | Status: DC

## 2020-04-19 NOTE — Telephone Encounter (Signed)
amLODipine (NORVASC) 10 MG tablet  cloNIDine (CATAPRES) 0.1 MG tablet    Patient is requesting a refill.    Pharmacy:  [facility] [place]), [place] - [address] Phone:  [phone]  Fax:  [phone]

## 2020-05-08 ENCOUNTER — Other Ambulatory Visit: Payer: Self-pay

## 2020-05-08 ENCOUNTER — Ambulatory Visit: Payer: Self-pay | Attending: Family Medicine | Admitting: Family Medicine

## 2020-05-08 ENCOUNTER — Encounter: Payer: Self-pay | Admitting: Family Medicine

## 2020-05-08 VITALS — BP 138/89 | HR 86 | Temp 97.3°F | Resp 16 | Ht 64.0 in | Wt 109.0 lb

## 2020-05-08 DIAGNOSIS — I150 Renovascular hypertension: Secondary | ICD-10-CM

## 2020-05-08 DIAGNOSIS — K089 Disorder of teeth and supporting structures, unspecified: Secondary | ICD-10-CM

## 2020-05-08 DIAGNOSIS — N1832 Chronic kidney disease, stage 3b: Secondary | ICD-10-CM

## 2020-05-08 DIAGNOSIS — Z1211 Encounter for screening for malignant neoplasm of colon: Secondary | ICD-10-CM

## 2020-05-08 MED ORDER — CLONIDINE HCL 0.1 MG PO TABS: 0.1000 mg | ORAL_TABLET | Freq: Three times a day (TID) | ORAL | 1 refills | Status: DC

## 2020-05-08 MED ORDER — AMLODIPINE BESYLATE 10 MG PO TABS: 10.0000 mg | ORAL_TABLET | Freq: Every day | ORAL | 1 refills | Status: DC

## 2020-05-08 NOTE — Progress Notes (Signed)
Routine HTN f /u

## 2020-05-08 NOTE — Patient Instructions (Addendum)
Food Basics for Chronic Kidney Disease When your kidneys are not working well, they cannot remove waste and excess substances from your blood as effectively as they did before. This can lead to a buildup and imbalance of these substances, which can worsen kidney damage and affect how your body functions. Certain foods lead to a buildup of these substances in the body. By changing your diet as recommended by your diet and nutrition specialist (dietitian) or health care provider, you could help prevent further kidney damage and delay or prevent the need for dialysis. What are tips for following this plan? General instructions   Work with your health care provider and dietitian to develop a meal plan that is right for you. Foods you can eat, limit, or avoid will be different for each person depending on the stage of kidney disease and any other existing health conditions.  Talk with your health care provider about whether you should take a vitamin and mineral supplement.  Use standard measuring cups and spoons to measure servings of foods. Use a kitchen scale to measure portions of protein foods.  If directed by your health care provider, avoid drinking too much fluid. Measure and count all liquids, including water, ice, soups, flavored gelatin, and frozen desserts such as popsicles or ice cream. Reading food labels  Check the amount of sodium in foods. Choose foods that have less than 300 milligrams (mg) per serving.  Check the ingredient list for phosphorus or potassium-based additives or preservatives.  Check the amount of saturated and trans fat. Limit or avoid these fats as told by your dietitian. Shopping  Avoid buying foods that are: ? Processed, frozen, or prepackaged. ? Calcium-enriched or fortified.  Do not buy foods that have salt or sodium listed among the first five ingredients.  Do not buy canned vegetables. Cooking  Replace animal proteins, such as meat, fish, eggs, or  dairy, with plant proteins from beans, nuts, and soy. ? Use soy milk instead of cow's milk. ? Add beans or tofu to soups, casseroles, or pasta dishes instead of meat.  Soak vegetables, such as potatoes, before cooking to reduce potassium. To do this: ? Peel and cut into small pieces. ? Soak in warm water for at least 2 hours. For every 1 cup of vegetables, use 10 cups of water. ? Drain and rinse with warm water. ? Boil for at least 5 minutes. Meal planning  Limit the amount of protein from plant and animal sources you eat each day.  Do not add salt to food when cooking or before eating.  Eat meals and snacks at around the same time each day. If you have diabetes:  If you have diabetes (diabetes mellitus) and chronic kidney disease, it is important to keep your blood glucose in the target range recommended by your health care provider. Follow your diabetes management plan. This may include: ? Checking your blood glucose regularly. ? Taking oral medicines, insulin, or both. ? Exercising for at least 30 minutes on 5 or more days each week, or as told by your health care provider. ? Tracking how many servings of carbohydrates you eat at each meal.  You may be given specific guidelines on how much of certain foods and nutrients you may eat, depending on your stage of kidney disease and whether you have high blood pressure (hypertension). Follow your meal plan as told by your dietitian. What nutrients should be limited? The items listed are not a complete list. Talk with your dietitian   about what dietary choices are best for you. Potassium Potassium affects how steadily your heart beats. If too much potassium builds up in your blood, it can cause an irregular heartbeat or even a heart attack. You may need to eat less potassium, depending on your blood potassium levels and the stage of kidney disease. Talk to your dietitian about how much potassium you may have each day. You may need to limit  or avoid foods that are high in potassium, such as:  Milk and soy milk.  Fruits, such as bananas, papaya, apricots, nectarines, melon, prunes, raisins, kiwi, and oranges.  Vegetables, such as potatoes, sweet potatoes, yams, tomatoes, leafy greens, beets, okra, avocado, pumpkin, and winter squash.  White and lima beans. Phosphorus Phosphorus is a mineral found in your bones. A balance between calcium and phosphorous is needed to build and maintain healthy bones. Too much phosphorus pulls calcium from your bones. This can make your bones weak and more likely to break. Too much phosphorus can also make your skin itch. You may need to eat less phosphorus depending on your blood phosphorus levels and the stage of kidney disease. Talk to your dietitian about how much potassium you may have each day. You may need to take medicine to lower your blood phosphorus levels if diet changes do not help. You may need to limit or avoid foods that are high in phosphorus, such as:  Milk and dairy products.  Dried beans and peas.  Tofu, soy milk, and other soy-based meat replacements.  Colas.  Nuts and peanut butter.  Meat, poultry, and fish.  Bran cereals and oatmeals. Protein Protein helps you to make and keep muscle. It also helps in the repair of your body's cells and tissues. One of the natural breakdown products of protein is a waste product called urea. When your kidneys are not working properly, they cannot remove wastes, such as urea, like they did before you developed chronic kidney disease. Reducing how much protein you eat can help prevent a buildup of urea in your blood. Depending on your stage of kidney disease, you may need to limit foods that are high in protein. Sources of animal protein include:  Meat (all types).  Fish and seafood.  Poultry.  Eggs.  Dairy. Other protein foods include:  Beans and legumes.  Nuts and nut butter.  Soy and tofu. Sodium Sodium, which is found  in salt, helps maintain a healthy balance of fluids in your body. Too much sodium can increase your blood pressure and have a negative effect on the function of your heart and lungs. Too much sodium can also cause your body to retain too much fluid, making your kidneys work harder. Most people should have less than 2,300 milligrams (mg) of sodium each day. If you have hypertension, you may need to limit your sodium to 1,500 mg each day. Talk to your dietitian about how much sodium you may have each day. You may need to limit or avoid foods that are high in sodium, such as:  Salt seasonings.  Soy sauce.  Cured and processed meats.  Salted crackers and snack foods.  Fast food.  Canned soups and most canned foods.  Pickled foods.  Vegetable juice.  Boxed mixes or ready-to-eat boxed meals and side dishes.  Bottled dressings, sauces, and marinades. Summary  Chronic kidney disease can lead to a buildup and imbalance of waste and excess substances in the body. Certain foods lead to a buildup of these substances. By adjusting  your intake of these foods, you could help prevent more kidney damage and delay or prevent the need for dialysis.  Food adjustments are different for each person with chronic kidney disease. Work with a dietitian to set up nutrient goals and a meal plan that is right for you.  If you have diabetes and chronic kidney disease, it is important to keep your blood glucose in the target range recommended by your health care provider. This information is not intended to replace advice given to you by your health care provider. Make sure you discuss any questions you have with your health care provider. Document Revised: 12/08/2018 Document Reviewed: 08/12/2016 Elsevier Patient Education  2020 [employer]. Educational information on

## 2020-05-08 NOTE — Progress Notes (Signed)
Established Patient Office Visit  Subjective:  Patient ID: Rebecca Marquez, female    DOB: 1961/02/02  Age: 59 y.o. MRN: [id number]  CC: f/u HTN and renal disease  HPI Rebecca Marquez, 59 year old female seen in follow-up of hypertension and chronic kidney disease.  She reports that she is compliant with her medications on a daily basis.  She denies any headaches or dizziness related to her blood pressure.  She was unable to attend a nephrology appointment for which she was referred at her last visit.  She denies any issues with peripheral edema, no excessive fatigue.  She does note that she has difficulty gaining weight.  Past Medical History:  Diagnosis Date  . Hypertension Dx March 2015  . Kidney disease   . Renal disorder    R/T HTN  . Seasonal allergies     Past Surgical History:  Procedure Laterality Date  . TUBAL LIGATION      Family History  Problem Relation Age of Onset  . Diabetes Mother   . Arthritis Mother   . Multiple sclerosis Daughter 80    Social History   Socioeconomic History  . Marital status: Legally Separated    Spouse name: Not on file  . Number of children: Not on file  . Years of education: Not on file  . Highest education level: Not on file  Occupational History  . Not on file  Tobacco Use  . Smoking status: Former Smoker    Packs/day: 0.50    Years: 20.00    Pack years: 10.00    Types: Cigarettes    Quit date: 08/31/2001    Years since quitting: 18.6  . Smokeless tobacco: Never Used  Substance and Sexual Activity  . Alcohol use: Yes    Alcohol/week: 1.0 standard drink    Types: 1 Cans of beer per week  . Drug use: Yes    Types: Marijuana    Comment: last used 04/28/2015  . Sexual activity: Not on file  Other Topics Concern  . Not on file  Social History Narrative  . Not on file   Social Determinants of Health   Financial Resource Strain:   . Difficulty of Paying Living Expenses: Not on file  Food Insecurity:   . Worried  About Charity fundraiser in the Last Year: Not on file  . Ran Out of Food in the Last Year: Not on file  Transportation Needs:   . Lack of Transportation (Medical): Not on file  . Lack of Transportation (Non-Medical): Not on file  Physical Activity:   . Days of Exercise per Week: Not on file  . Minutes of Exercise per Session: Not on file  Stress:   . Feeling of Stress : Not on file  Social Connections:   . Frequency of Communication with Friends and Family: Not on file  . Frequency of Social Gatherings with Friends and Family: Not on file  . Attends Religious Services: Not on file  . Active Member of Clubs or Organizations: Not on file  . Attends Archivist Meetings: Not on file  . Marital Status: Not on file  Intimate Partner Violence:   . Fear of Current or Ex-Partner: Not on file  . Emotionally Abused: Not on file  . Physically Abused: Not on file  . Sexually Abused: Not on file    Outpatient Medications Prior to Visit  Medication Sig Dispense Refill  . amLODipine (NORVASC) 10 MG tablet Take 1 tablet (10 mg  total) by mouth daily. 90 tablet 0  . cloNIDine (CATAPRES) 0.1 MG tablet Take 1 tablet (0.1 mg total) by mouth 3 (three) times daily. 270 tablet 0  . methocarbamol (ROBAXIN) 500 MG tablet Take 1 tablet (500 mg total) by mouth 4 (four) times daily. 20 tablet 0   No facility-administered medications prior to visit.    Allergies  Allergen Reactions  . Nsaids Other (See Comments)    ckd  . Losartan Tinitus    ROS Review of Systems  Constitutional: Positive for fatigue. Negative for chills and fever.       Difficulty gaining weight  HENT: Negative for sore throat and trouble swallowing.   Eyes: Negative for photophobia and visual disturbance.  Respiratory: Negative for cough and shortness of breath.   Cardiovascular: Negative for chest pain, palpitations and leg swelling.  Gastrointestinal: Positive for anal bleeding (reports recurrent hemmoroids).  Negative for constipation.  Endocrine: Negative for polydipsia, polyphagia and polyuria.  Genitourinary: Negative for dysuria, flank pain and frequency.  Musculoskeletal: Negative for arthralgias and back pain.  Skin: Negative for rash and wound.  Neurological: Positive for numbness (recurrent numbness in right arm since receiving Covid vaccine). Negative for dizziness and headaches.  Hematological: Negative for adenopathy. Does not bruise/bleed easily.  Psychiatric/Behavioral: Negative for sleep disturbance. The patient is not nervous/anxious.       Objective:    Physical Exam Constitutional:      General: She is not in acute distress.    Appearance: Normal appearance.  Neck:     Vascular: No carotid bruit.  Cardiovascular:     Rate and Rhythm: Normal rate and regular rhythm.  Pulmonary:     Effort: Pulmonary effort is normal.     Breath sounds: Normal breath sounds.  Abdominal:     Tenderness: There is no abdominal tenderness. There is no right CVA tenderness, left CVA tenderness or guarding.  Musculoskeletal:     Right lower leg: No edema.     Left lower leg: No edema.  Lymphadenopathy:     Cervical: No cervical adenopathy.  Skin:    General: Skin is warm and dry.  Neurological:     General: No focal deficit present.     Mental Status: She is alert and oriented to person, place, and time.  Psychiatric:        Mood and Affect: Mood normal.        Behavior: Behavior normal.     BP 138/89   Pulse 86   Temp (!) 97.3 F (36.3 C)   Resp 16   Ht 5\' 4"  (1.626 m)   Wt 109 lb (49.4 kg)   SpO2 98%   BMI 18.71 kg/m  Wt Readings from Last 3 Encounters:  05/08/20 109 lb (49.4 kg)  01/03/20 111 lb 6.4 oz (50.5 kg)  10/05/19 114 lb (51.7 kg)     Health Maintenance Due  Topic Date Due  . COVID-19 Vaccine (1) Never done  . MAMMOGRAM  Never done  . COLONOSCOPY  Never done  . PAP SMEAR-Modifier  04/28/2018  . INFLUENZA VACCINE  Never done   Influenza immunization was  offered but declined at today's visit.   Lab Results  Component Value Date   TSH 0.745 07/20/2017   Lab Results  Component Value Date   WBC 4.5 10/05/2019   HGB 14.3 10/05/2019   HCT 42.7 10/05/2019   MCV 96 10/05/2019   PLT 232 10/05/2019   Lab Results  Component Value Date  NA 142 01/03/2020   K 4.4 01/03/2020   CO2 20 01/03/2020   GLUCOSE 82 01/03/2020   BUN 22 01/03/2020   CREATININE 1.88 (H) 01/03/2020   BILITOT <0.2 07/20/2017   ALKPHOS 118 (H) 07/20/2017   AST 20 07/20/2017   ALT 13 07/20/2017   PROT 7.0 07/20/2017   ALBUMIN 4.1 01/03/2020   CALCIUM 9.5 01/03/2020   ANIONGAP 6 01/19/2017   Lab Results  Component Value Date   CHOL 206 (H) 01/03/2020   Lab Results  Component Value Date   HDL 64 01/03/2020   Lab Results  Component Value Date   LDLCALC 121 (H) 01/03/2020   Lab Results  Component Value Date   TRIG 120 01/03/2020   Lab Results  Component Value Date   CHOLHDL 3.2 01/03/2020   Lab Results  Component Value Date   HGBA1C 5.6 01/03/2020      Assessment & Plan:  1. Renovascular hypertension She reports that her blood pressure is controlled on her current medications.  Refills provided of Catapres and amlodipine.  She will have a renal function panel and CBC done in follow-up of chronic kidney disease.  New nephrology referral placed. - Renal Function Panel - CBC - cloNIDine (CATAPRES) 0.1 MG tablet; Take 1 tablet (0.1 mg total) by mouth 3 (three) times daily.  Dispense: 270 tablet; Refill: 1 - amLODipine (NORVASC) 10 MG tablet; Take 1 tablet (10 mg total) by mouth daily.  Dispense: 90 tablet; Refill: 1  2. CKD stage G3b/A1, GFR 30-44 and albumin creatinine ratio <30 mg/g New referral placed for patient to follow-up with nephrology as she states that she believes she missed the last appointment that was set up for her.  She will have renal function panel in follow-up of CKD and CBC to look for anemia associated with chronic kidney  disease.  Educational material on diet for chronic kidney disease provided.  She is encouraged to apply for the [facility] financial discount program for help with medical follow-up/specialty referrals. - Renal Function Panel - CBC -Ambulatory nephrology referral  3. Poor dentition She reports issues with poor dentition and tooth loss.  Referral placed for patient to follow-up with dentistry. - Ambulatory referral to Dentistry  4. Screening for colon cancer Discussed screening for colon cancer and patient agrees to be referred to gastroenterology.  Referral placed. - Ambulatory referral to Gastroenterology     Follow-up: Return in about 4 months (around 09/07/2020) for HTN/renal disease; sooner if needed; needs appt for [facility] discount program.   Antony Blackbird, MD

## 2020-05-09 LAB — RENAL FUNCTION PANEL
Albumin: 4 g/dL (ref 3.8–4.9)
BUN/Creatinine Ratio: 14 (ref 9–23)
BUN: 30 mg/dL — ABNORMAL HIGH (ref 6–24)
CO2: 20 mmol/L (ref 20–29)
Calcium: 9.1 mg/dL (ref 8.7–10.2)
Chloride: 107 mmol/L — ABNORMAL HIGH (ref 96–106)
Creatinine, Ser: 2.1 mg/dL — ABNORMAL HIGH (ref 0.57–1.00)
GFR calc Af Amer: 30 mL/min/1.73 — ABNORMAL LOW
GFR calc non Af Amer: 26 mL/min/1.73 — ABNORMAL LOW
Glucose: 98 mg/dL (ref 65–99)
Phosphorus: 3.2 mg/dL (ref 3.0–4.3)
Potassium: 4.5 mmol/L (ref 3.5–5.2)
Sodium: 143 mmol/L (ref 134–144)

## 2020-05-09 LAB — CBC
Hematocrit: 43.3 % (ref 34.0–46.6)
Hemoglobin: 15 g/dL (ref 11.1–15.9)
MCH: 34.2 pg — ABNORMAL HIGH (ref 26.6–33.0)
MCHC: 34.6 g/dL (ref 31.5–35.7)
MCV: 99 fL — ABNORMAL HIGH (ref 79–97)
Platelets: 222 x10E3/uL (ref 150–450)
RBC: 4.38 x10E6/uL (ref 3.77–5.28)
RDW: 14 % (ref 11.7–15.4)
WBC: 4 x10E3/uL (ref 3.4–10.8)

## 2020-05-15 ENCOUNTER — Other Ambulatory Visit: Payer: Self-pay

## 2020-05-15 ENCOUNTER — Ambulatory Visit: Payer: Self-pay | Attending: Family Medicine

## 2020-06-03 ENCOUNTER — Other Ambulatory Visit: Payer: Self-pay | Admitting: Family Medicine

## 2020-06-03 DIAGNOSIS — I150 Renovascular hypertension: Secondary | ICD-10-CM

## 2020-06-03 MED ORDER — AMLODIPINE BESYLATE 10 MG PO TABS: 10.0000 mg | ORAL_TABLET | Freq: Every day | ORAL | 0 refills | Status: DC

## 2020-06-03 MED FILL — AMLODIPINE BESYLATE 10 MG T: 10 | 30 days supply | Qty: 30 | Fill #0

## 2020-06-03 NOTE — Telephone Encounter (Signed)
Change of pharmacy Requested Prescriptions  Pending Prescriptions Disp Refills  . amLODipine (NORVASC) 10 MG tablet 90 tablet 0    Sig: Take 1 tablet (10 mg total) by mouth daily.     Cardiovascular:  Calcium Channel Blockers Passed - 06/03/2020  1:56 PM      Passed - Last BP in normal range    BP Readings from Last 1 Encounters:  05/08/20 138/89         Passed - Valid encounter within last 6 months    Recent Outpatient Visits          3 weeks ago Renovascular hypertension   [place] [facility], [place], MD   5 months ago Renovascular hypertension   [place] U[place], Lamar, MD   8 months ago Renovascular hypertension   [place] Itmann, [place], MD   2 years ago CKD (chronic kidney disease) stage 4, GFR 15-29 ml/min ([place])   [place], Neoma Laming B, MD   3 years ago CKD (chronic kidney disease) stage 4, GFR 15-29 ml/min ([place])   [place] Boykin Nearing, MD      Future Appointments            In 1 week Antony Blackbird, MD [place]   In 3 months Fulp, [place], MD [place]

## 2020-06-03 NOTE — Telephone Encounter (Signed)
Medication: amLODipine (NORVASC) 10 MG tablet [[id number]]   Has the patient contacted their pharmacy? YES  (Agent: If no, request that the patient contact the pharmacy for the refill.) (Agent: If yes, when and what did the pharmacy advise?)  Preferred Pharmacy (with phone number or street name): [place]: Please be advised that RX refills may take up to 3 business days. We ask that you follow-up with your pharmacy.

## 2020-06-12 ENCOUNTER — Ambulatory Visit: Payer: Medicaid Other | Admitting: Family Medicine

## 2020-06-26 ENCOUNTER — Ambulatory Visit: Payer: Medicaid Other | Admitting: Family Medicine

## 2020-06-27 ENCOUNTER — Telehealth: Payer: Self-pay

## 2020-06-27 NOTE — Telephone Encounter (Signed)
Spoke w/pt to advise that [facility] does not do DOT physicals, pt states that she called 10/27 and advised [place] that she got a new job and did not need physical anymore

## 2020-06-27 NOTE — Telephone Encounter (Signed)
-----   Message from Antony Blackbird, MD sent at 06/25/2020  6:47 PM EDT ----- Regarding: 06/26/2020 appointment Please contact patient as she is on the schedule for DOT physical.  I do not do DOT physicals to the patient would need to find a provider that does the DOT physicals.  Please clarify if DOT physical is what patient needed to be seen for

## 2020-07-08 ENCOUNTER — Other Ambulatory Visit: Payer: Self-pay | Admitting: Family Medicine

## 2020-07-08 DIAGNOSIS — I150 Renovascular hypertension: Secondary | ICD-10-CM

## 2020-07-08 NOTE — Telephone Encounter (Signed)
Medication: amLODipine (NORVASC) 10 MG tablet [[id number]]   Has the patient contacted their pharmacy? YES  (Agent: If no, request that the patient contact the pharmacy for the refill.) (Agent: If yes, when and what did the pharmacy advise?)  Preferred Pharmacy (with phone number or street name): [facility], [address] [place] [address] Phone: [phone] Fax: [phone] Hours: Not open 24 hours    Agent: Please be advised that RX refills may take up to 3 business days. We ask that you follow-up with your pharmacy.

## 2020-07-11 MED FILL — AMLODIPINE BESYLATE 10 MG T: 10 | 30 days supply | Qty: 30 | Fill #1

## 2020-08-06 ENCOUNTER — Other Ambulatory Visit: Payer: Self-pay | Admitting: Family Medicine

## 2020-08-06 DIAGNOSIS — I150 Renovascular hypertension: Secondary | ICD-10-CM

## 2020-08-06 MED ORDER — CLONIDINE HCL 0.1 MG PO TABS: 0.1000 mg | ORAL_TABLET | Freq: Three times a day (TID) | ORAL | 0 refills | Status: DC

## 2020-08-06 NOTE — Telephone Encounter (Signed)
Copied from [place] [id number]. Topic: Quick Communication - Rx Refill/Question >> Aug 06, 2020 12:12 PM Leward Quan A wrote: Medication: cloNIDine (CATAPRES) 0.1 MG tablet  Has the patient contacted their pharmacy? Yes.   (Agent: If no, request that the patient contact the pharmacy for the refill.) (Agent: If yes, when and what did the pharmacy advise?)  Preferred Pharmacy (with phone number or street name): [facility], [address] [place]  Phone:  [phone] Fax:  [phone]     Agent: Please be advised that RX refills may take up to 3 business days. We ask that you follow-up with your pharmacy.

## 2020-08-07 MED FILL — cloNIDine HCL 0.1 MG TABS: 0.1 | 30 days supply | Qty: 90 | Fill #0

## 2020-08-08 MED FILL — AMLODIPINE BESYLATE 10 MG T: 10 | 30 days supply | Qty: 30 | Fill #2

## 2020-08-15 MED FILL — AMLODIPINE BESYLATE 10 MG T: 10 | 30 days supply | Qty: 30 | Fill #2

## 2020-09-06 ENCOUNTER — Other Ambulatory Visit: Payer: Self-pay | Admitting: Family Medicine

## 2020-09-06 DIAGNOSIS — I150 Renovascular hypertension: Secondary | ICD-10-CM

## 2020-09-06 MED ORDER — CLONIDINE HCL 0.1 MG PO TABS: 0.1000 mg | ORAL_TABLET | Freq: Three times a day (TID) | ORAL | 0 refills | Status: DC

## 2020-09-06 MED ORDER — AMLODIPINE BESYLATE 10 MG PO TABS: 10.0000 mg | ORAL_TABLET | Freq: Every day | ORAL | 0 refills | Status: DC

## 2020-09-06 MED FILL — cloNIDine HCL 0.1 MG TABS: 0.1 | 30 days supply | Qty: 90 | Fill #1

## 2020-09-06 NOTE — Telephone Encounter (Signed)
Medication:  cloNIDine (CATAPRES) 0.1 MG tablet [[id number]]  amLODipine (NORVASC) 10 MG tablet [[id number]  Has the patient contacted their pharmacy? No  (Agent: If no, request that the patient contact the pharmacy for the refill.)   Preferred Pharmacy (with phone number or street name):  [facility], [address]  [place], [place] [address]  Phone:  [phone] Fax:  [phone]  Agent: Please be advised that RX refills may take up to 3 business days. We ask that you follow-up with your pharmacy.

## 2020-09-11 ENCOUNTER — Ambulatory Visit: Payer: Medicaid Other | Admitting: Family Medicine

## 2020-09-13 ENCOUNTER — Telehealth (INDEPENDENT_AMBULATORY_CARE_PROVIDER_SITE_OTHER): Payer: Self-pay

## 2020-09-13 ENCOUNTER — Other Ambulatory Visit (INDEPENDENT_AMBULATORY_CARE_PROVIDER_SITE_OTHER): Payer: Self-pay | Admitting: Family Medicine

## 2020-09-13 DIAGNOSIS — I150 Renovascular hypertension: Secondary | ICD-10-CM

## 2020-09-13 MED ORDER — AMLODIPINE BESYLATE 10 MG PO TABS: 10.0000 mg | ORAL_TABLET | Freq: Every day | ORAL | 0 refills | Status: DC

## 2020-09-13 NOTE — Telephone Encounter (Signed)
Copied from [place] [id number]. Topic: General - Other >> Sep 13, 2020 11:47 AM Leward Quan A wrote: Reason for CRM: Patient called in to request Rx that was printed on 09/06/20 to be sent to the [facility], [place] please she only have 2 pills left an need this done today please and if someone can call her at Ph# [phone] when it is done please.   Would you send Rx to [facility] pharmacy per patient request. Nat Christen, CMA

## 2020-09-13 NOTE — Addendum Note (Signed)
Addended by: Charlott Rakes on: 09/13/2020 04:48 PM   Modules accepted: Orders

## 2020-09-13 NOTE — Telephone Encounter (Signed)
Done

## 2020-09-16 MED FILL — AMLODIPINE BESYLATE 10 MG T: 10 | 30 days supply | Qty: 30 | Fill #0

## 2020-09-17 ENCOUNTER — Ambulatory Visit: Payer: Medicaid Other | Admitting: Family Medicine

## 2020-10-08 ENCOUNTER — Telehealth: Payer: Self-pay | Admitting: Internal Medicine

## 2020-10-08 MED FILL — AMLODIPINE BESYLATE 10 MG T: 10 | 30 days supply | Qty: 30 | Fill #1

## 2020-10-08 MED FILL — cloNIDine HCL 0.1 MG TABS: 0.1 | 30 days supply | Qty: 90 | Fill #2

## 2020-10-08 NOTE — Telephone Encounter (Signed)
[facility] called and spoke to [place], Merchant [employer] officer about the refills requested. She says she is not able to receive the amlodipine until Friday, but she has a refill there for Clonidine that she will refill.

## 2020-10-08 NOTE — Telephone Encounter (Signed)
My Error. It should be Clonidine not Klonopin. Please advise.

## 2020-10-08 NOTE — Telephone Encounter (Signed)
Medication Refill - Medication: Klonopin, amlodipine   Has the patient contacted their pharmacy? Yes.   Pt states that she contacted pharmacy and they told her she is out of refills and she needs to contact PCP. Pt has an appt scheduled for 11/14/20. Please advise.  (Agent: If no, request that the patient contact the pharmacy for the refill.) (Agent: If yes, when and what did the pharmacy advise?)  Preferred Pharmacy (with phone number or street name):  [facility], [address]  [place] [address]  Phone: [phone] Fax: [phone]  Hours: Not open 24 hours     Agent: Please be advised that RX refills may take up to 3 business days. We ask that you follow-up with your pharmacy.

## 2020-10-26 ENCOUNTER — Other Ambulatory Visit: Payer: Self-pay

## 2020-10-26 ENCOUNTER — Ambulatory Visit (HOSPITAL_COMMUNITY)
Admission: EM | Admit: 2020-10-26 | Discharge: 2020-10-26 | Disposition: A | Discharge status: Home / Self Care | Payer: BLUE CROSS/BLUE SHIELD | Attending: Family Medicine | Admitting: Family Medicine

## 2020-10-26 ENCOUNTER — Ambulatory Visit (INDEPENDENT_AMBULATORY_CARE_PROVIDER_SITE_OTHER): Payer: BLUE CROSS/BLUE SHIELD

## 2020-10-26 DIAGNOSIS — W19XXXA Unspecified fall, initial encounter: Secondary | ICD-10-CM

## 2020-10-26 DIAGNOSIS — S60512A Abrasion of left hand, initial encounter: Secondary | ICD-10-CM

## 2020-10-26 DIAGNOSIS — S6292XA Unspecified fracture of left wrist and hand, initial encounter for closed fracture: Secondary | ICD-10-CM | POA: Diagnosis not present

## 2020-10-26 DIAGNOSIS — M79642 Pain in left hand: Secondary | ICD-10-CM | POA: Diagnosis not present

## 2020-10-26 MED ORDER — HIBICLENS 4 % EX LIQD: Freq: Every day | CUTANEOUS | 0 refills | Status: DC | PRN

## 2020-10-26 MED ORDER — MUPIROCIN 2 % EX OINT: 1.0000 "application " | TOPICAL_OINTMENT | Freq: Two times a day (BID) | CUTANEOUS | 0 refills | Status: DC

## 2020-10-26 MED ORDER — TRAMADOL HCL 50 MG PO TABS: 50.0000 mg | ORAL_TABLET | Freq: Four times a day (QID) | ORAL | 0 refills | Status: DC | PRN

## 2020-10-26 NOTE — ED Provider Notes (Signed)
[facility]    CSN: [id number] Arrival date & time: 10/26/20  1040      History   Chief Complaint Chief Complaint  Patient presents with  . Hand Injury    HPI Rebecca Marquez is a 60 y.o. female.   Patient presenting today for 2-day history of left hand pain at the base of her thumb after a fall where she feels she caught herself with as part of her hand.  Denies significant swelling, numbness or tingling, discoloration poor range of motion in this area.  Does also have an abrasion from the fall on the third knuckle that she has been dressing with over-the-counter ointments and is afraid is infected.  Some soreness in this area no swelling no thick drainage from the area, no fevers, chills, sweats.  So far has not taken anything over-the-counter for pain.     Past Medical History:  Diagnosis Date  . Hypertension Dx March 2015  . Kidney disease   . Renal disorder    R/T HTN  . Seasonal allergies     Patient Active Problem List   Diagnosis Date Noted  . Lumbar degenerative disc disease 10/07/2019  . 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 Transient vision disturbance of both eyes 09/25/2016  . Poor dentition 02/06/2016  . Glossitis 04/29/2015  . HTN (hypertension) 12/11/2013  . HLD (hyperlipidemia) 11/16/2013  . CKD (chronic kidney disease) stage 4, GFR 15-29 ml/min ([facility]) 11/16/2013    Past Surgical History:  Procedure Laterality Date  . TUBAL LIGATION      OB History   No obstetric history on file.      Home Medications    Prior to Admission medications   Medication Sig Start Date End Date Taking? Authorizing Provider  chlorhexidine (HIBICLENS) 4 % external liquid Apply topically daily as needed. 10/26/20  Yes Volney American, PA-C  mupirocin ointment (BACTROBAN) 2 % Apply 1 application topically 2 (two) times daily. 10/26/20  Yes Volney American, PA-C  traMADol (ULTRAM) 50 MG tablet Take 1 tablet (50 mg total) by mouth every 6 (six) hours as  needed. 10/26/20  Yes Volney American, PA-C  amLODipine (NORVASC) 10 MG tablet Take 1 tablet (10 mg total) by mouth daily. 09/13/20   Charlott Rakes, MD  cloNIDine (CATAPRES) 0.1 MG tablet Take 1 tablet (0.1 mg total) by mouth 3 (three) times daily. 09/06/20   Charlott Rakes, MD  methocarbamol (ROBAXIN) 500 MG tablet Take 1 tablet (500 mg total) by mouth 4 (four) times daily. 02/22/20   Fransico Meadow, PA-C  cetirizine (ZYRTEC) 5 MG tablet Take 1 tablet (5 mg total) by mouth daily. 06/27/18 02/27/19  Loura Halt A, NP  fluticasone (FLONASE) 50 MCG/ACT nasal spray Place 1 spray into both nostrils daily. 10/21/18 02/27/19  Raylene Everts, MD    Family History Family History  Problem Relation Age of Onset  . Diabetes Mother   . Arthritis Mother   . Multiple sclerosis Daughter 59    Social History Social History   Tobacco Use  . Smoking status: Former Smoker    Packs/day: 0.50    Years: 20.00    Pack years: 10.00    Types: Cigarettes    Quit date: 08/31/2001    Years since quitting: 19.1  . Smokeless tobacco: Never Used  Substance Use Topics  . Alcohol use: Yes    Alcohol/week: 1.0 standard drink    Types: 1 Cans of beer per week  . Drug use: Yes  Types: Marijuana    Comment: last used 04/28/2015     Allergies   Nsaids and Losartan   Review of Systems Review of Systems Per HPI Physical Exam Triage Vital Signs ED Triage Vitals  Enc Vitals Group     BP 10/26/20 1103 123/90     Pulse Rate 10/26/20 1103 74     Resp 10/26/20 1103 18     Temp 10/26/20 1103 98.3 F (36.8 C)     Temp Source 10/26/20 1103 Oral     SpO2 10/26/20 1103 100 %     Weight --      Height --      Head Circumference --      Peak Flow --      Pain Score 10/26/20 1107 9     Pain Loc --      Pain Edu? --      Excl. in [place]? --    No data found.  Updated Vital Signs BP 123/90 (BP Location: Right Arm)   Pulse 74   Temp 98.3 F (36.8 C) (Oral)   Resp 18   SpO2 100%   Visual  Acuity Right Eye Distance:   Left Eye Distance:   Bilateral Distance:    Right Eye Near:   Left Eye Near:    Bilateral Near:     Physical Exam Vitals and nursing note reviewed.  Constitutional:      Appearance: Normal appearance. She is not ill-appearing.  HENT:     Head: Atraumatic.  Eyes:     Extraocular Movements: Extraocular movements intact.     Conjunctiva/sclera: Conjunctivae normal.  Cardiovascular:     Rate and Rhythm: Normal rate and regular rhythm.     Heart sounds: Normal heart sounds.  Pulmonary:     Effort: Pulmonary effort is normal.     Breath sounds: Normal breath sounds.  Musculoskeletal:        General: Tenderness (moderate ttp base of left thumb) present. No swelling. Normal range of motion.     Cervical back: Normal range of motion and neck supple.  Skin:    General: Skin is warm.     Comments: Superficial abrasion to left third knuckle, no thick drainage, erythema to surrounding tissue, edema.   Neurological:     Mental Status: She is alert and oriented to person, place, and time.     Comments: B/l UEs neurovascularly intact  Psychiatric:        Mood and Affect: Mood normal.        Thought Content: Thought content normal.        Judgment: Judgment normal.      UC Treatments / Results  Labs (all labs ordered are listed, but only abnormal results are displayed) Labs Reviewed - No data to display  EKG   Radiology DG Hand Complete Left  Result Date: 10/26/2020 CLINICAL DATA:  Two day history of right hand pain at base of thumb after fall. EXAM: LEFT HAND - COMPLETE 3+ VIEW COMPARISON:  Radiograph Jan 10, 2018. FINDINGS: Tiny osseous fragment at the base of the thumb with probable donor site from the proximal metadiaphysis of the second metacarpal. Similar irregularity of the tuft of the fourth digit related to known glomus tumor. IMPRESSION: Tiny osseous fragment at the base of the thumb with probable donor site from the proximal metadiaphysis of  the second metacarpal. Electronically Signed   By: Dahlia Bailiff MD   On: 10/26/2020 12:19    Procedures Procedures (including  critical care time)  Medications Ordered in UC Medications - No data to display  Initial Impression / Assessment and Plan / UC Course  I have reviewed the triage vital signs and the nursing notes.  Pertinent labs & imaging results that were available during my care of the patient were reviewed by me and considered in my medical decision making (see chart for details).     X-ray of her left hand today showing a small fragment from a fracture at the base of her thumb.  Placed in a thumb spica splint instructed to follow-up with orthopedic hand specialist early next week.  Over-the-counter Tylenol as needed and will send a very small supply of tramadol for as needed severe pain relief with strict precautions.  PDMP reviewed and appropriate.  Abrasion to knuckle area appears to be healing well without any complication.  Will send in Hibiclens and mupirocin ointment for home wound care and area dressed today with soft gauze and Coban wrap.  Return precautions reviewed with patient.  Work note given.  Final Clinical Impressions(s) / UC Diagnoses   Final diagnoses:  Closed fracture of left hand, initial encounter  Abrasion of left hand, initial encounter   Discharge Instructions   None    ED Prescriptions    Medication Sig Dispense Auth. Provider   chlorhexidine (HIBICLENS) 4 % external liquid Apply topically daily as needed. 120 mL Volney American, PA-C   mupirocin ointment (BACTROBAN) 2 % Apply 1 application topically 2 (two) times daily. 22 g Volney American, [place]   traMADol (ULTRAM) 50 MG tablet Take 1 tablet (50 mg total) by mouth every 6 (six) hours as needed. 15 tablet Volney American, [place]     I have reviewed the PDMP during this encounter.   Volney American, [place] 10/26/20 1255

## 2020-10-26 NOTE — ED Triage Notes (Signed)
Pt reports she fell walking up steps at her appt building.Pt now has pain swelling and small abrasion to Lt hand

## 2020-11-14 ENCOUNTER — Other Ambulatory Visit: Payer: Self-pay | Admitting: Family Medicine

## 2020-11-14 ENCOUNTER — Other Ambulatory Visit: Payer: Self-pay

## 2020-11-14 ENCOUNTER — Ambulatory Visit: Payer: BLUE CROSS/BLUE SHIELD | Attending: Family Medicine | Admitting: Family Medicine

## 2020-11-14 ENCOUNTER — Encounter: Payer: Self-pay | Admitting: Family Medicine

## 2020-11-14 VITALS — BP 130/72 | HR 71 | Ht 64.0 in | Wt 107.0 lb

## 2020-11-14 DIAGNOSIS — R636 Underweight: Secondary | ICD-10-CM | POA: Diagnosis not present

## 2020-11-14 DIAGNOSIS — N1832 Chronic kidney disease, stage 3b: Secondary | ICD-10-CM | POA: Diagnosis not present

## 2020-11-14 DIAGNOSIS — K0889 Other specified disorders of teeth and supporting structures: Secondary | ICD-10-CM | POA: Diagnosis not present

## 2020-11-14 DIAGNOSIS — I150 Renovascular hypertension: Secondary | ICD-10-CM

## 2020-11-14 MED ORDER — CLONIDINE HCL 0.1 MG PO TABS: 0.1000 mg | ORAL_TABLET | Freq: Three times a day (TID) | ORAL | 1 refills | Status: DC

## 2020-11-14 MED ORDER — AMLODIPINE BESYLATE 10 MG PO TABS: 10.0000 mg | ORAL_TABLET | Freq: Every day | ORAL | 1 refills | Status: DC

## 2020-11-14 NOTE — Progress Notes (Signed)
Subjective:  Patient ID: Rebecca Marquez, female    DOB: 1961/04/11  Age: 60 y.o. MRN: [id number]  CC: Hypertension   HPI Rebecca Marquez is a 60 year old female with a history of hypertension, stage III chronic kidney disease who presents today for follow-up visit.  She does not have a Nephrologist She is concerned about her weight loss and attributes it to her antihypertensive as she states prior to commencing an antihypertensive she weighed at least 10 pounds heavier.  She has no adverse effects from her medications. Would also like to see a Dentist as her teeth are bad and it affects her ability to eat.  She has no additional concerns today.  Past Medical History:  Diagnosis Date  . Hypertension Dx March 2015  . Kidney disease   . Renal disorder    R/T HTN  . Seasonal allergies     Past Surgical History:  Procedure Laterality Date  . TUBAL LIGATION      Family History  Problem Relation Age of Onset  . Diabetes Mother   . Arthritis Mother   . Multiple sclerosis Daughter 58    Allergies  Allergen Reactions  . Nsaids Other (See Comments)    ckd  . Losartan Tinitus    Outpatient Medications Prior to Visit  Medication Sig Dispense Refill  . chlorhexidine (HIBICLENS) 4 % external liquid Apply topically daily as needed. 120 mL 0  . mupirocin ointment (BACTROBAN) 2 % Apply 1 application topically 2 (two) times daily. 22 g 0  . traMADol (ULTRAM) 50 MG tablet Take 1 tablet (50 mg total) by mouth every 6 (six) hours as needed. 15 tablet 0  . amLODipine (NORVASC) 10 MG tablet Take 1 tablet (10 mg total) by mouth daily. 90 tablet 0  . cloNIDine (CATAPRES) 0.1 MG tablet Take 1 tablet (0.1 mg total) by mouth 3 (three) times daily. 270 tablet 0  . methocarbamol (ROBAXIN) 500 MG tablet Take 1 tablet (500 mg total) by mouth 4 (four) times daily. (Patient not taking: Reported on 11/14/2020) 20 tablet 0   No facility-administered medications prior to visit.     ROS Review of  Systems  Constitutional: Positive for unexpected weight change. Negative for activity change, appetite change and fatigue.  HENT: Positive for dental problem. Negative for congestion, sinus pressure and sore throat.   Eyes: Negative for visual disturbance.  Respiratory: Negative for cough, chest tightness, shortness of breath and wheezing.   Cardiovascular: Negative for chest pain and palpitations.  Gastrointestinal: Negative for abdominal distention, abdominal pain and constipation.  Endocrine: Negative for polydipsia.  Genitourinary: Negative for dysuria and frequency.  Musculoskeletal: Negative for arthralgias and back pain.  Skin: Negative for rash.  Neurological: Negative for tremors, light-headedness and numbness.  Hematological: Does not bruise/bleed easily.  Psychiatric/Behavioral: Negative for agitation and behavioral problems.    Objective:  BP 130/72   Pulse 71   Ht '5\' 4"'  (1.626 m)   Wt 107 lb (48.5 kg)   SpO2 98%   BMI 18.37 kg/m   BP/Weight 11/14/2020 6/62/9476 01/01/6502  Systolic BP 546 568 127  Diastolic BP 72 90 89  Wt. (Lbs) 107 - 109  BMI 18.37 - 18.71      Physical Exam Constitutional:      Appearance: She is well-developed.  Neck:     Vascular: No JVD.  Cardiovascular:     Rate and Rhythm: Normal rate.     Heart sounds: Normal heart sounds. No murmur heard.  Pulmonary:     Effort: Pulmonary effort is normal.     Breath sounds: Normal breath sounds. No wheezing or rales.  Chest:     Chest wall: No tenderness.  Abdominal:     General: Bowel sounds are normal. There is no distension.     Palpations: Abdomen is soft. There is no mass.     Tenderness: There is no abdominal tenderness.  Musculoskeletal:        General: Normal range of motion.     Right lower leg: No edema.     Left lower leg: No edema.  Neurological:     Mental Status: She is alert and oriented to person, place, and time.  Psychiatric:        Mood and Affect: Mood normal.      CMP Latest Ref Rng & Units 05/08/2020 01/03/2020 10/05/2019  Glucose 65 - 99 mg/dL 98 82 89  BUN 6 - 24 mg/dL 30(H) 22 36(H)  Creatinine 0.57 - 1.00 mg/dL 2.10(H) 1.88(H) 2.15(H)  Sodium 134 - 144 mmol/L 143 142 137  Potassium 3.5 - 5.2 mmol/L 4.5 4.4 4.8  Chloride 96 - 106 mmol/L 107(H) 105 106  CO2 20 - 29 mmol/L 20 20 18(L)  Calcium 8.7 - 10.2 mg/dL 9.1 9.5 9.3  Total Protein 6.0 - 8.5 g/dL - - -  Total Bilirubin 0.0 - 1.2 mg/dL - - -  Alkaline Phos 39 - 117 IU/L - - -  AST 0 - 40 IU/L - - -  ALT 0 - 32 IU/L - - -    Lipid Panel     Component Value Date/Time   CHOL 206 (H) 01/03/2020 1055   TRIG 120 01/03/2020 1055   HDL 64 01/03/2020 1055   CHOLHDL 3.2 01/03/2020 1055   CHOLHDL 4.0 09/25/2016 0949   VLDL 17 09/25/2016 0949   LDLCALC 121 (H) 01/03/2020 1055    CBC    Component Value Date/Time   WBC 4.0 05/08/2020 1000   WBC 5.0 01/19/2017 2118   RBC 4.38 05/08/2020 1000   RBC 4.41 01/19/2017 2118   HGB 15.0 05/08/2020 1000   HCT 43.3 05/08/2020 1000   PLT 222 05/08/2020 1000   MCV 99 (H) 05/08/2020 1000   MCH 34.2 (H) 05/08/2020 1000   MCH 31.5 01/19/2017 2118   MCHC 34.6 05/08/2020 1000   MCHC 33.4 01/19/2017 2118   RDW 14.0 05/08/2020 1000   LYMPHSABS 2.0 01/19/2017 2118   MONOABS 0.3 01/19/2017 2118   EOSABS 0.3 01/19/2017 2118   BASOSABS 0.1 01/19/2017 2118    Lab Results  Component Value Date   HGBA1C 5.6 01/03/2020    Assessment & Plan:  1. Renovascular hypertension Controlled Counseled on blood pressure goal of less than 130/80, low-sodium, DASH diet, medication compliance, 150 minutes of moderate intensity exercise per week. Discussed medication compliance, adverse effects. - amLODipine (NORVASC) 10 MG tablet; Take 1 tablet (10 mg total) by mouth daily.  Dispense: 90 tablet; Refill: 1 - cloNIDine (CATAPRES) 0.1 MG tablet; Take 1 tablet (0.1 mg total) by mouth 3 (three) times daily.  Dispense: 270 tablet; Refill: 1 - CMP14+EGFR  2. CKD stage  G3b/A1, GFR 30-44 and albumin creatinine ratio <30 mg/g (HCC) - Ambulatory referral to Nephrology  3. Pain, dental - Ambulatory referral to Dentistry  4. Underweight We will check thyroid labs Poor dentition could contribute to her poor oral intake as well She will also need evaluation to exclude malignancy and has been scheduled for a complete  physical exam - T4, free - TSH   Health Care Maintenance: At next visit Meds ordered this encounter  Medications  . amLODipine (NORVASC) 10 MG tablet    Sig: Take 1 tablet (10 mg total) by mouth daily.    Dispense:  90 tablet    Refill:  1  . cloNIDine (CATAPRES) 0.1 MG tablet    Sig: Take 1 tablet (0.1 mg total) by mouth 3 (three) times daily.    Dispense:  270 tablet    Refill:  1    Follow-up: Return in about 1 month (around 12/15/2020) for Complete physical exam.       Charlott Rakes, MD, FAAFP. [facility] and [place], [employer]   11/14/2020, 8:57 AM

## 2020-11-14 NOTE — Progress Notes (Signed)
No concerns Had medications and muffin for breakfast.

## 2020-11-14 NOTE — Patient Instructions (Signed)
Dental Pain Dental pain is often a sign that something is wrong with your teeth or gums. You can also have pain after a dental treatment. If you have dental pain, it is important to contact your dentist, especially if the cause of the pain is not known. Dental pain may hurt a lot or a little and can be caused by many things, including:  Tooth decay (cavities or caries).  Infection.  The inner part of the tooth being filled with pus (an abscess).  Injury.  A crack in the tooth.  Gums that move back and expose the root of a tooth.  Gum disease.  Abnormal grinding or clenching of teeth.  Not taking good care of your teeth. Sometimes the cause of pain is not known. You may have pain all the time, or it may happen only when you are:  Chewing.  Exposed to hot or cold temperatures.  Eating or drinking foods or drinks that have a lot of sugar in them, such as soda or candy. Follow these instructions at home: Medicines  Take over-the-counter and prescription medicines only as told by your dentist.  If you were prescribed an antibiotic medicine, take it as told by your dentist. Do not stop taking it even if you start to feel better. Eating and drinking Do not eat foods or drinks that cause you pain. These include:  Very hot or very cold foods or drinks.  Sweet or sugary foods or drinks. Managing pain and swelling  If told, put ice on the painful area of your face. To do this: ? Put ice in a plastic bag. ? Place a towel between your skin and the bag. ? Leave the ice on for 20 minutes, 2-3 times a day. ? Take off the ice if your skin turns bright red. This is very important. If you cannot feel pain, heat, or cold, you have a greater risk of damage to the area.   Brushing your teeth  Brush your teeth twice a day using a fluoride toothpaste.  Use a toothpaste made for sensitive teeth as told by your dentist.  Use a soft toothbrush. General instructions  Floss your teeth at  least once a day.  Do not put heat on the outside of your face.  Rinse your mouth often with salt water. To make salt water, dissolve -1 tsp (3-6 g) of salt in 1 cup (237 mL) of warm water.  Watch your dental pain. Let your dentist know if there are any changes.  Keep all follow-up visits. Contact a dentist if:  You have dental pain and you do not know why.  Medicine does not help your pain.  Your symptoms get worse.  You have new symptoms. Get help right away if:  You cannot open your mouth.  You are having trouble breathing or swallowing.  You have a fever.  Your face, neck, or jaw is swollen. These symptoms may be an emergency. Get help right away. Call your local emergency services (911 in the [place]  Do not wait to see if the symptoms will go away.  Do not drive yourself to the hospital. Summary  Dental pain may be caused by many things, including tooth decay, injury, or infection. In some cases, the cause is not known.  Dental pain may hurt a lot or very little. You may have pain all the time, or you may have it only when you eat or drink.  Take over-the-counter and prescription medicines only as told   by your dentist.  Watch your dental pain for any changes. Let your dentist know if symptoms get worse. This information is not intended to replace advice given to you by your health care provider. Make sure you discuss any questions you have with your health care provider. Document Revised: 05/22/2020 Document Reviewed: 05/22/2020 Elsevier Patient Education  2021 Elsevier Inc.  

## 2020-11-15 ENCOUNTER — Other Ambulatory Visit: Payer: Self-pay | Admitting: Family Medicine

## 2020-11-15 DIAGNOSIS — N1832 Chronic kidney disease, stage 3b: Secondary | ICD-10-CM

## 2020-11-15 LAB — CMP14+EGFR
ALT: 21 IU/L (ref 0–32)
AST: 29 IU/L (ref 0–40)
Albumin/Globulin Ratio: 1.2 (ref 1.2–2.2)
Albumin: 4.1 g/dL (ref 3.8–4.9)
Alkaline Phosphatase: 134 IU/L — ABNORMAL HIGH (ref 44–121)
BUN/Creatinine Ratio: 13 (ref 9–23)
BUN: 28 mg/dL — ABNORMAL HIGH (ref 6–24)
Bilirubin Total: 0.2 mg/dL (ref 0.0–1.2)
CO2: 18 mmol/L — ABNORMAL LOW (ref 20–29)
Calcium: 9.4 mg/dL (ref 8.7–10.2)
Chloride: 104 mmol/L (ref 96–106)
Creatinine, Ser: 2.11 mg/dL — ABNORMAL HIGH (ref 0.57–1.00)
Globulin, Total: 3.5 g/dL (ref 1.5–4.5)
Glucose: 93 mg/dL (ref 65–99)
Potassium: 4.4 mmol/L (ref 3.5–5.2)
Sodium: 139 mmol/L (ref 134–144)
Total Protein: 7.6 g/dL (ref 6.0–8.5)
eGFR: 27 mL/min/{1.73_m2} — ABNORMAL LOW (ref >59)

## 2020-11-15 LAB — TSH: TSH: 1.28 u[IU]/mL (ref 0.450–4.500)

## 2020-11-15 LAB — T4, FREE: Free T4: 1 ng/dL (ref 0.82–1.77)

## 2020-11-30 ENCOUNTER — Other Ambulatory Visit: Payer: Self-pay

## 2020-12-18 ENCOUNTER — Other Ambulatory Visit: Payer: Self-pay

## 2020-12-18 MED FILL — Amlodipine Besylate Tab 10 MG (Base Equivalent): ORAL | 30 days supply | Qty: 30 | Fill #0 | Status: AC

## 2020-12-18 MED FILL — Clonidine HCl Tab 0.1 MG: ORAL | 30 days supply | Qty: 90 | Fill #0 | Status: AC

## 2020-12-19 ENCOUNTER — Other Ambulatory Visit: Payer: Self-pay

## 2020-12-25 ENCOUNTER — Ambulatory Visit: Payer: BLUE CROSS/BLUE SHIELD | Admitting: Family Medicine

## 2021-01-16 ENCOUNTER — Other Ambulatory Visit: Payer: Self-pay

## 2021-01-16 MED FILL — Clonidine HCl Tab 0.1 MG: ORAL | 30 days supply | Qty: 90 | Fill #1 | Status: AC

## 2021-01-16 MED FILL — Amlodipine Besylate Tab 10 MG (Base Equivalent): ORAL | 30 days supply | Qty: 30 | Fill #1 | Status: AC

## 2021-01-17 ENCOUNTER — Other Ambulatory Visit: Payer: Self-pay

## 2021-02-13 ENCOUNTER — Other Ambulatory Visit: Payer: Self-pay

## 2021-02-13 MED FILL — Amlodipine Besylate Tab 10 MG (Base Equivalent): ORAL | 30 days supply | Qty: 30 | Fill #2 | Status: AC

## 2021-02-14 ENCOUNTER — Other Ambulatory Visit: Payer: Self-pay

## 2021-03-05 ENCOUNTER — Telehealth: Payer: Self-pay | Admitting: Family Medicine

## 2021-03-05 NOTE — Telephone Encounter (Signed)
Called pt to inform PCP is scheduled to work virtually 03/06/21 so she will not be in the office that day. Pt was scheduled for a Physical. I LVM stating she can either keep the appt to address other needs (med refills, etc) or she can resched for the next PHYSICAL available w/ PCP (only). LVM to call [place] back to inform us her decision.

## 2021-03-06 ENCOUNTER — Encounter: Payer: Self-pay | Admitting: Family Medicine

## 2021-03-06 ENCOUNTER — Other Ambulatory Visit: Payer: Self-pay

## 2021-03-06 ENCOUNTER — Ambulatory Visit (HOSPITAL_BASED_OUTPATIENT_CLINIC_OR_DEPARTMENT_OTHER): Payer: BLUE CROSS/BLUE SHIELD | Admitting: Family Medicine

## 2021-03-06 DIAGNOSIS — N1832 Chronic kidney disease, stage 3b: Secondary | ICD-10-CM

## 2021-03-06 DIAGNOSIS — K0889 Other specified disorders of teeth and supporting structures: Secondary | ICD-10-CM | POA: Diagnosis not present

## 2021-03-06 DIAGNOSIS — N183 Chronic kidney disease, stage 3 unspecified: Secondary | ICD-10-CM | POA: Diagnosis not present

## 2021-03-06 DIAGNOSIS — I129 Hypertensive chronic kidney disease with stage 1 through stage 4 chronic kidney disease, or unspecified chronic kidney disease: Secondary | ICD-10-CM | POA: Diagnosis not present

## 2021-03-06 MED ORDER — AMLODIPINE BESYLATE 10 MG PO TABS
ORAL_TABLET | Freq: Every day | ORAL | 1 refills | Status: DC
  Filled 2021-03-06: qty 90, fill #0
  Filled 2021-03-18: qty 30, 30d supply, fill #0
  Filled 2021-04-17: qty 30, 30d supply, fill #1
  Filled 2021-05-15: qty 30, 30d supply, fill #2
  Filled 2021-06-17: qty 30, 30d supply, fill #3
  Filled 2021-07-16: qty 30, 30d supply, fill #4
  Filled 2021-08-15: qty 30, 30d supply, fill #5

## 2021-03-06 MED ORDER — CLONIDINE HCL 0.1 MG PO TABS
ORAL_TABLET | ORAL | 1 refills | Status: DC
  Filled 2021-03-06: qty 90, 30d supply, fill #0
  Filled 2021-04-10: qty 90, 30d supply, fill #1
  Filled 2021-05-15: qty 90, 30d supply, fill #2
  Filled 2021-06-17: qty 90, 30d supply, fill #3
  Filled 2021-07-16: qty 90, 30d supply, fill #4
  Filled 2021-09-01: qty 90, 30d supply, fill #5

## 2021-03-06 NOTE — Progress Notes (Signed)
Virtual Visit via Video Note  I connected with Rebecca Marquez, on 03/06/2021 at 8:35 AM by video enabled telemedicine device due to the COVID-19 pandemic and verified that I am speaking with the correct person using two identifiers.   Consent: I discussed the limitations, risks, security and privacy concerns of performing an evaluation and management service by telemedicine and the availability of in person appointments. I also discussed with the patient that there may be a patient responsible charge related to this service. The patient expressed understanding and agreed to proceed.   Location of Patient: Home  Location of Provider: Clinic   Persons participating in Telemedicine visit: Rebecca Marquez Dr. Margarita Rana     History of Present Illness: 60 year old female with a history of hypertension, stage III chronic kidney disease here for chronic disease management. At her last visit she had complained of dental pain and I had referred her to a dentist but she was informed the cost would be significant. She is waiting on her insurance as this will be a long process for her.  I had also referred her to nephrology for evaluation of her stage III CKD but she had to reschedule her appointment. Endorses compliance with her antihypertensive and tolerates it well except that she feels her weight loss could be secondary to her antihypertensive.  She is not willing to change her medication at this time and has decided to stay put.  She checks her blood pressures at home and they have been under 130/80 She has no additional concerns today.  Past Medical History:  Diagnosis Date   Hypertension Dx March 2015   Kidney disease    Renal disorder    R/T HTN   Seasonal allergies    Allergies  Allergen Reactions   Nsaids Other (See Comments)    ckd   Losartan Tinitus    Current Outpatient Medications on File Prior to Visit  Medication Sig Dispense Refill   amLODipine (NORVASC) 10 MG  tablet TAKE 1 TABLET (10 MG TOTAL) BY MOUTH DAILY. 90 tablet 1   chlorhexidine (HIBICLENS) 4 % external liquid Apply topically daily as needed. 120 mL 0   cloNIDine (CATAPRES) 0.1 MG tablet TAKE 1 TABLET (0.1 MG TOTAL) BY MOUTH 3 (THREE) TIMES DAILY. 270 tablet 1   methocarbamol (ROBAXIN) 500 MG tablet Take 1 tablet (500 mg total) by mouth 4 (four) times daily. (Patient not taking: Reported on 11/14/2020) 20 tablet 0   mupirocin ointment (BACTROBAN) 2 % Apply 1 application topically 2 (two) times daily. 22 g 0   traMADol (ULTRAM) 50 MG tablet Take 1 tablet (50 mg total) by mouth every 6 (six) hours as needed. 15 tablet 0   [DISCONTINUED] cetirizine (ZYRTEC) 5 MG tablet Take 1 tablet (5 mg total) by mouth daily. 30 tablet 1   [DISCONTINUED] fluticasone (FLONASE) 50 MCG/ACT nasal spray Place 1 spray into both nostrils daily. 16 g 2   No current facility-administered medications on file prior to visit.    ROS: See HPI  Observations/Objective: Awake, alert, oriented x3 Normal appearance Respiratory-not in acute distress, normal breathing Neck-no JVD Musculoskeletal-FROM Psych-normal   Assessment and Plan: 1. Benign hypertension with chronic kidney disease, stage III (HCC) Controlled Continue current regimen Counseled on blood pressure goal of less than 130/80, low-sodium, DASH diet, medication compliance, 150 minutes of moderate intensity exercise per week. Discussed medication compliance, adverse effects. - amLODipine (NORVASC) 10 MG tablet; TAKE 1 TABLET (10 MG TOTAL) BY MOUTH DAILY.  Dispense:  90 tablet; Refill: 1 - cloNIDine (CATAPRES) 0.1 MG tablet; TAKE 1 TABLET (0.1 MG TOTAL) BY MOUTH 3 (THREE) TIMES DAILY.  Dispense: 270 tablet; Refill: 1  2. CKD stage G3b/A1, GFR 30-44 and albumin creatinine ratio <30 mg/g (HCC) Hypertensive nephropathy Advised to schedule appointment with nephrology Avoid nephrotoxins  3. Pain, dental Uncontrolled She leads extraction and will need some  implants Currently working with her insurance and a strategy to get this done   Follow Up Instructions: Complete physical exam in 1 month.  She is due for Pap smear, mammogram and colonoscopy.   I discussed the assessment and treatment plan with the patient. The patient was provided an opportunity to ask questions and all were answered. The patient agreed with the plan and demonstrated an understanding of the instructions.   The patient was advised to call back or seek an in-person evaluation if the symptoms worsen or if the condition fails to improve as anticipated.     I provided 17 minutes total of Telehealth time during this encounter including median intraservice time, reviewing previous notes, investigations, ordering medications, medical decision making, coordinating care and patient verbalized understanding at the end of the visit.     Charlott Rakes, MD, FAAFP. [facility] and [place] [facility], [place]   03/06/2021, 8:35 AM

## 2021-03-07 ENCOUNTER — Other Ambulatory Visit: Payer: Self-pay

## 2021-03-10 ENCOUNTER — Telehealth: Payer: Self-pay | Admitting: Family Medicine

## 2021-03-10 NOTE — Telephone Encounter (Signed)
Called patient and LVM to return call and schedule an appointment for a physical. If patient calls back please schedule appointment.

## 2021-03-10 NOTE — Telephone Encounter (Signed)
-----   Message from Charlott Rakes, MD sent at 03/06/2021  9:05 AM EDT ----- Hi, Please schedule for a complete physical exam on next available slot. Thanks, EN.

## 2021-03-18 ENCOUNTER — Other Ambulatory Visit: Payer: Self-pay

## 2021-04-10 ENCOUNTER — Other Ambulatory Visit: Payer: Self-pay

## 2021-04-17 ENCOUNTER — Other Ambulatory Visit: Payer: Self-pay

## 2021-04-24 ENCOUNTER — Other Ambulatory Visit: Payer: Self-pay

## 2021-04-24 ENCOUNTER — Encounter: Payer: Self-pay | Admitting: Family Medicine

## 2021-04-24 ENCOUNTER — Other Ambulatory Visit (HOSPITAL_COMMUNITY)
Admission: RE | Admit: 2021-04-24 | Discharge: 2021-04-24 | Disposition: A | Discharge status: Home / Self Care | Payer: BLUE CROSS/BLUE SHIELD | Source: Ambulatory Visit | Attending: Family Medicine | Admitting: Family Medicine

## 2021-04-24 ENCOUNTER — Ambulatory Visit: Payer: BLUE CROSS/BLUE SHIELD | Attending: Family Medicine | Admitting: Family Medicine

## 2021-04-24 VITALS — BP 109/70 | HR 62 | Ht 63.0 in | Wt 102.8 lb

## 2021-04-24 DIAGNOSIS — Z1211 Encounter for screening for malignant neoplasm of colon: Secondary | ICD-10-CM

## 2021-04-24 DIAGNOSIS — Z124 Encounter for screening for malignant neoplasm of cervix: Secondary | ICD-10-CM | POA: Diagnosis not present

## 2021-04-24 DIAGNOSIS — Z0000 Encounter for general adult medical examination without abnormal findings: Secondary | ICD-10-CM

## 2021-04-24 DIAGNOSIS — Z1231 Encounter for screening mammogram for malignant neoplasm of breast: Secondary | ICD-10-CM

## 2021-04-24 NOTE — Progress Notes (Signed)
Subjective:  Patient ID: Rebecca Marquez, female    DOB: 09-13-1960  Age: 60 y.o. MRN: [id number]  CC: Annual Exam and Gynecologic Exam   HPI Abigal Choung is a 60 y.o. year old female with a history of hypertension, stage III chronic kidney disease.  Interval History: She is today for complete physical exam and is due for Pap smear, mammogram, colonoscopy. She is unhappy with her current weight as her appetite is decreased due to dental problems and her medical insurance does not cover dental. Past Medical History:  Diagnosis Date   Hypertension Dx March 2015   Kidney disease    Renal disorder    R/T HTN   Seasonal allergies     Past Surgical History:  Procedure Laterality Date   TUBAL LIGATION      Family History  Problem Relation Age of Onset   Diabetes Mother    Arthritis Mother    Multiple sclerosis Daughter 47    Allergies  Allergen Reactions   Nsaids Other (See Comments)    ckd   Losartan Tinitus    Outpatient Medications Prior to Visit  Medication Sig Dispense Refill   amLODipine (NORVASC) 10 MG tablet TAKE 1 TABLET (10 MG TOTAL) BY MOUTH DAILY. 90 tablet 1   chlorhexidine (HIBICLENS) 4 % external liquid Apply topically daily as needed. 120 mL 0   cloNIDine (CATAPRES) 0.1 MG tablet TAKE 1 TABLET (0.1 MG TOTAL) BY MOUTH 3 (THREE) TIMES DAILY. 270 tablet 1   methocarbamol (ROBAXIN) 500 MG tablet Take 1 tablet (500 mg total) by mouth 4 (four) times daily. 20 tablet 0   mupirocin ointment (BACTROBAN) 2 % Apply 1 application topically 2 (two) times daily. 22 g 0   traMADol (ULTRAM) 50 MG tablet Take 1 tablet (50 mg total) by mouth every 6 (six) hours as needed. 15 tablet 0   No facility-administered medications prior to visit.     ROS Review of Systems  Constitutional:  Negative for activity change, appetite change and fatigue.  HENT:  Negative for congestion, sinus pressure and sore throat.   Eyes:  Negative for visual disturbance.  Respiratory:   Negative for cough, chest tightness, shortness of breath and wheezing.   Cardiovascular:  Negative for chest pain and palpitations.  Gastrointestinal:  Negative for abdominal distention, abdominal pain and constipation.  Endocrine: Negative for polydipsia.  Genitourinary:  Negative for dysuria and frequency.  Musculoskeletal:  Negative for arthralgias and back pain.  Skin:  Negative for rash.  Neurological:  Negative for tremors, light-headedness and numbness.  Hematological:  Does not bruise/bleed easily.  Psychiatric/Behavioral:  Negative for agitation and behavioral problems.    Objective:  BP 109/70   Pulse 62   Ht 5\' 3"  (1.6 m)   Wt 102 lb 12.8 oz (46.6 kg)   SpO2 100%   BMI 18.21 kg/m   BP/Weight 04/24/2021 11/14/2020 0/93/2671  Systolic BP 245 809 983  Diastolic BP 70 72 90  Wt. (Lbs) 102.8 107 -  BMI 18.21 18.37 -      Physical Exam Exam conducted with a chaperone present.  Constitutional:      General: She is not in acute distress.    Appearance: She is well-developed. She is not diaphoretic.     Comments: Underweight  HENT:     Head: Normocephalic.     Right Ear: External ear normal.     Left Ear: External ear normal.     Nose: Nose normal.  Eyes:  Conjunctiva/sclera: Conjunctivae normal.     Pupils: Pupils are equal, round, and reactive to light.  Neck:     Vascular: No JVD.  Cardiovascular:     Rate and Rhythm: Normal rate and regular rhythm.     Heart sounds: Normal heart sounds. No murmur heard.   No gallop.  Pulmonary:     Effort: Pulmonary effort is normal. No respiratory distress.     Breath sounds: Normal breath sounds. No wheezing or rales.  Chest:     Chest wall: No tenderness.  Breasts:    Right: Normal. No mass, nipple discharge or tenderness.     Left: Normal. No mass, nipple discharge or tenderness.  Abdominal:     General: Bowel sounds are normal. There is no distension.     Palpations: Abdomen is soft. There is no mass.      Tenderness: There is no abdominal tenderness.     Hernia: There is no hernia in the left inguinal area or right inguinal area.  Genitourinary:    General: Normal vulva.     Pubic Area: No rash.      Labia:        Right: No rash.        Left: No rash.      Vagina: Normal.     Cervix: Normal.     Uterus: Normal.      Adnexa: Right adnexa normal and left adnexa normal.       Right: No tenderness.         Left: No tenderness.    Musculoskeletal:        General: No tenderness. Normal range of motion.     Cervical back: Normal range of motion. No tenderness.     Right lower leg: No edema.     Left lower leg: No edema.  Lymphadenopathy:     Upper Body:     Right upper body: No supraclavicular or axillary adenopathy.     Left upper body: No supraclavicular or axillary adenopathy.  Skin:    General: Skin is warm and dry.  Neurological:     Mental Status: She is alert and oriented to person, place, and time.     Deep Tendon Reflexes: Reflexes are normal and symmetric.  Psychiatric:        Mood and Affect: Mood normal.    CMP Latest Ref Rng & Units 11/14/2020 05/08/2020 01/03/2020  Glucose 65 - 99 mg/dL 93 98 82  BUN 6 - 24 mg/dL 28(H) 30(H) 22  Creatinine 0.57 - 1.00 mg/dL 2.11(H) 2.10(H) 1.88(H)  Sodium 134 - 144 mmol/L 139 143 142  Potassium 3.5 - 5.2 mmol/L 4.4 4.5 4.4  Chloride 96 - 106 mmol/L 104 107(H) 105  CO2 20 - 29 mmol/L 18(L) 20 20  Calcium 8.7 - 10.2 mg/dL 9.4 9.1 9.5  Total Protein 6.0 - 8.5 g/dL 7.6 - -  Total Bilirubin 0.0 - 1.2 mg/dL 0.2 - -  Alkaline Phos 44 - 121 IU/L 134(H) - -  AST 0 - 40 IU/L 29 - -  ALT 0 - 32 IU/L 21 - -    Lipid Panel     Component Value Date/Time   CHOL 206 (H) 01/03/2020 1055   TRIG 120 01/03/2020 1055   HDL 64 01/03/2020 1055   CHOLHDL 3.2 01/03/2020 1055   CHOLHDL 4.0 09/25/2016 0949   VLDL 17 09/25/2016 0949   LDLCALC 121 (H) 01/03/2020 1055    CBC    Component Value Date/Time  WBC 4.0 05/08/2020 1000   WBC 5.0  01/19/2017 2118   RBC 4.38 05/08/2020 1000   RBC 4.41 01/19/2017 2118   HGB 15.0 05/08/2020 1000   HCT 43.3 05/08/2020 1000   PLT 222 05/08/2020 1000   MCV 99 (H) 05/08/2020 1000   MCH 34.2 (H) 05/08/2020 1000   MCH 31.5 01/19/2017 2118   MCHC 34.6 05/08/2020 1000   MCHC 33.4 01/19/2017 2118   RDW 14.0 05/08/2020 1000   LYMPHSABS 2.0 01/19/2017 2118   MONOABS 0.3 01/19/2017 2118   EOSABS 0.3 01/19/2017 2118   BASOSABS 0.1 01/19/2017 2118    Lab Results  Component Value Date   HGBA1C 5.6 01/03/2020    Assessment & Plan:  1. Annual physical exam Counseled on 150 minutes of exercise per week, healthy eating (including decreased daily intake of saturated fats, cholesterol, added sugars, sodium),routine healthcare maintenance.  - Basic Metabolic Panel  2. Encounter for screening mammogram for malignant neoplasm of breast - MM 3D SCREEN BREAST BILATERAL; Future  3. Screening for colon cancer - Ambulatory referral to Gastroenterology  4. Screening for cervical cancer - Cytology - PAP([place])   Health Care Maintenance: Declines flu shot No orders of the defined types were placed in this encounter.   Follow-up: Return in about 6 months (around 10/25/2021) for Medical conditions.       Charlott Rakes, MD, FAAFP. [facility] and [place], [place]   04/24/2021, 3:05 PM

## 2021-04-24 NOTE — Patient Instructions (Signed)
Health Maintenance, Female Adopting a healthy lifestyle and getting preventive care are important in promoting health and wellness. Ask your health care provider about: The right schedule for you to have regular tests and exams. Things you can do on your own to prevent diseases and keep yourself healthy. What should I know about diet, weight, and exercise? Eat a healthy diet  Eat a diet that includes plenty of vegetables, fruits, low-fat dairy products, and lean protein. Do not eat a lot of foods that are high in solid fats, added sugars, or sodium.  Maintain a healthy weight Body mass index (BMI) is used to identify weight problems. It estimates body fat based on height and weight. Your health care provider can help determineyour BMI and help you achieve or maintain a healthy weight. Get regular exercise Get regular exercise. This is one of the most important things you can do for your health. Most adults should: Exercise for at least 150 minutes each week. The exercise should increase your heart rate and make you sweat (moderate-intensity exercise). Do strengthening exercises at least twice a week. This is in addition to the moderate-intensity exercise. Spend less time sitting. Even light physical activity can be beneficial. Watch cholesterol and blood lipids Have your blood tested for lipids and cholesterol at 58 years of age, then havethis test every 5 years. Have your cholesterol levels checked more often if: Your lipid or cholesterol levels are high. You are older than 58 years of age. You are at high risk for heart disease. What should I know about cancer screening? Depending on your health history and family history, you may need to have cancer screening at various ages. This may include screening for: Breast cancer. Cervical cancer. Colorectal cancer. Skin cancer. Lung cancer. What should I know about heart disease, diabetes, and high blood pressure? Blood pressure and heart  disease High blood pressure causes heart disease and increases the risk of stroke. This is more likely to develop in people who have high blood pressure readings, are of African descent, or are overweight. Have your blood pressure checked: Every 3-5 years if you are 18-39 years of age. Every year if you are 40 years old or older. Diabetes Have regular diabetes screenings. This checks your fasting blood sugar level. Have the screening done: Once every three years after age 40 if you are at a normal weight and have a low risk for diabetes. More often and at a younger age if you are overweight or have a high risk for diabetes. What should I know about preventing infection? Hepatitis B If you have a higher risk for hepatitis B, you should be screened for this virus. Talk with your health care provider to find out if you are at risk forhepatitis B infection. Hepatitis C Testing is recommended for: Everyone born from 1945 through 1965. Anyone with known risk factors for hepatitis C. Sexually transmitted infections (STIs) Get screened for STIs, including gonorrhea and chlamydia, if: You are sexually active and are younger than 58 years of age. You are older than 58 years of age and your health care provider tells you that you are at risk for this type of infection. Your sexual activity has changed since you were last screened, and you are at increased risk for chlamydia or gonorrhea. Ask your health care provider if you are at risk. Ask your health care provider about whether you are at high risk for HIV. Your health care provider may recommend a prescription medicine to help   prevent HIV infection. If you choose to take medicine to prevent HIV, you should first get tested for HIV. You should then be tested every 3 months for as long as you are taking the medicine. Pregnancy If you are about to stop having your period (premenopausal) and you may become pregnant, seek counseling before you get  pregnant. Take 400 to 800 micrograms (mcg) of folic acid every day if you become pregnant. Ask for birth control (contraception) if you want to prevent pregnancy. Osteoporosis and menopause Osteoporosis is a disease in which the bones lose minerals and strength with aging. This can result in bone fractures. If you are 65 years old or older, or if you are at risk for osteoporosis and fractures, ask your health care provider if you should: Be screened for bone loss. Take a calcium or vitamin D supplement to lower your risk of fractures. Be given hormone replacement therapy (HRT) to treat symptoms of menopause. Follow these instructions at home: Lifestyle Do not use any products that contain nicotine or tobacco, such as cigarettes, e-cigarettes, and chewing tobacco. If you need help quitting, ask your health care provider. Do not use street drugs. Do not share needles. Ask your health care provider for help if you need support or information about quitting drugs. Alcohol use Do not drink alcohol if: Your health care provider tells you not to drink. You are pregnant, may be pregnant, or are planning to become pregnant. If you drink alcohol: Limit how much you use to 0-1 drink a day. Limit intake if you are breastfeeding. Be aware of how much alcohol is in your drink. In the [place] one drink equals one 12 oz bottle of beer (355 mL), one 5 oz glass of wine (148 mL), or one 1 oz glass of hard liquor (44 mL). General instructions Schedule regular health, dental, and eye exams. Stay current with your vaccines. Tell your health care provider if: You often feel depressed. You have ever been abused or do not feel safe at home. Summary Adopting a healthy lifestyle and getting preventive care are important in promoting health and wellness. Follow your health care provider's instructions about healthy diet, exercising, and getting tested or screened for diseases. Follow your health care provider's  instructions on monitoring your cholesterol and blood pressure. This information is not intended to replace advice given to you by your health care provider. Make sure you discuss any questions you have with your healthcare provider. Document Revised: 08/10/2018 Document Reviewed: 08/10/2018 Elsevier Patient Education  2022 Elsevier Inc.  

## 2021-04-24 NOTE — Progress Notes (Signed)
Physical/pap

## 2021-04-25 LAB — CYTOLOGY - PAP
Comment: NEGATIVE
Diagnosis: NEGATIVE
High risk HPV: NEGATIVE

## 2021-04-25 LAB — BASIC METABOLIC PANEL
BUN/Creatinine Ratio: 14 (ref 9–23)
BUN: 29 mg/dL — ABNORMAL HIGH (ref 6–24)
CO2: 21 mmol/L (ref 20–29)
Calcium: 9.4 mg/dL (ref 8.7–10.2)
Chloride: 102 mmol/L (ref 96–106)
Creatinine, Ser: 2.08 mg/dL — ABNORMAL HIGH (ref 0.57–1.00)
Glucose: 83 mg/dL (ref 65–99)
Potassium: 3.9 mmol/L (ref 3.5–5.2)
Sodium: 139 mmol/L (ref 134–144)
eGFR: 27 mL/min/{1.73_m2} — ABNORMAL LOW (ref >59)

## 2021-05-15 ENCOUNTER — Other Ambulatory Visit: Payer: Self-pay

## 2021-06-17 ENCOUNTER — Other Ambulatory Visit: Payer: Self-pay

## 2021-07-08 ENCOUNTER — Ambulatory Visit: Payer: BLUE CROSS/BLUE SHIELD

## 2021-07-12 ENCOUNTER — Ambulatory Visit
Admission: RE | Admit: 2021-07-12 | Discharge: 2021-07-12 | Disposition: A | Discharge status: Home / Self Care | Payer: BLUE CROSS/BLUE SHIELD | Source: Ambulatory Visit | Attending: Family Medicine | Admitting: Family Medicine

## 2021-07-12 ENCOUNTER — Other Ambulatory Visit: Payer: Self-pay

## 2021-07-12 DIAGNOSIS — Z1231 Encounter for screening mammogram for malignant neoplasm of breast: Secondary | ICD-10-CM

## 2021-07-15 ENCOUNTER — Other Ambulatory Visit: Payer: Self-pay | Admitting: Family Medicine

## 2021-07-15 DIAGNOSIS — R928 Other abnormal and inconclusive findings on diagnostic imaging of breast: Secondary | ICD-10-CM

## 2021-07-16 ENCOUNTER — Other Ambulatory Visit: Payer: Self-pay

## 2021-08-11 ENCOUNTER — Ambulatory Visit
Admission: RE | Admit: 2021-08-11 | Discharge: 2021-08-11 | Disposition: A | Discharge status: Home / Self Care | Payer: BLUE CROSS/BLUE SHIELD | Source: Ambulatory Visit | Attending: Family Medicine | Admitting: Family Medicine

## 2021-08-11 DIAGNOSIS — R928 Other abnormal and inconclusive findings on diagnostic imaging of breast: Secondary | ICD-10-CM

## 2021-08-15 ENCOUNTER — Other Ambulatory Visit: Payer: BLUE CROSS/BLUE SHIELD

## 2021-08-15 ENCOUNTER — Other Ambulatory Visit: Payer: Self-pay

## 2021-08-28 ENCOUNTER — Other Ambulatory Visit: Payer: Self-pay

## 2021-08-28 ENCOUNTER — Ambulatory Visit (HOSPITAL_COMMUNITY)
Admission: EM | Admit: 2021-08-28 | Discharge: 2021-08-28 | Disposition: A | Discharge status: Home / Self Care | Payer: BLUE CROSS/BLUE SHIELD | Attending: Family Medicine | Admitting: Family Medicine

## 2021-08-28 ENCOUNTER — Encounter (HOSPITAL_COMMUNITY): Payer: Self-pay

## 2021-08-28 DIAGNOSIS — J069 Acute upper respiratory infection, unspecified: Secondary | ICD-10-CM | POA: Insufficient documentation

## 2021-08-28 DIAGNOSIS — I129 Hypertensive chronic kidney disease with stage 1 through stage 4 chronic kidney disease, or unspecified chronic kidney disease: Secondary | ICD-10-CM | POA: Insufficient documentation

## 2021-08-28 DIAGNOSIS — N184 Chronic kidney disease, stage 4 (severe): Secondary | ICD-10-CM | POA: Insufficient documentation

## 2021-08-28 DIAGNOSIS — U071 COVID-19: Secondary | ICD-10-CM | POA: Insufficient documentation

## 2021-08-28 LAB — POC INFLUENZA A AND B ANTIGEN (URGENT CARE ONLY)
INFLUENZA A ANTIGEN, POC: NEGATIVE
INFLUENZA B ANTIGEN, POC: NEGATIVE

## 2021-08-28 LAB — SARS CORONAVIRUS 2 (TAT 6-24 HRS): SARS Coronavirus 2: POSITIVE — AB

## 2021-08-28 NOTE — ED Provider Notes (Addendum)
[facility]    CSN: [id number] Arrival date & time: 08/28/21  1761      History   Chief Complaint Chief Complaint  Patient presents with   Cough    HPI Rebecca Marquez is a 60 y.o. female.    Cough Here with a 2 day h/o cough, congestion, aches, and malaise. Temp has been up to 102. No n/v/d.   PMH: htn, CKD, Cr 2.2 range  Past Medical History:  Diagnosis Date   Hypertension Dx March 2015   Kidney disease    Renal disorder    R/T HTN   Seasonal allergies     Patient Active Problem List   Diagnosis Date Noted   Lumbar degenerative disc disease 10/07/2019   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   Transient vision disturbance of both eyes 09/25/2016   Poor dentition 02/06/2016   Glossitis 04/29/2015   HTN (hypertension) 12/11/2013   HLD (hyperlipidemia) 11/16/2013   CKD (chronic kidney disease) stage 4, GFR 15-29 ml/min ([place]) 11/16/2013    Past Surgical History:  Procedure Laterality Date   TUBAL LIGATION      OB History   No obstetric history on file.      Home Medications    Prior to Admission medications   Medication Sig Start Date End Date Taking? Authorizing Provider  amLODipine (NORVASC) 10 MG tablet TAKE 1 TABLET (10 MG TOTAL) BY MOUTH DAILY. 03/06/21 03/06/22  Charlott Rakes, MD  chlorhexidine (HIBICLENS) 4 % external liquid Apply topically daily as needed. 10/26/20   Volney American, PA-C  cloNIDine (CATAPRES) 0.1 MG tablet TAKE 1 TABLET (0.1 MG TOTAL) BY MOUTH 3 (THREE) TIMES DAILY. 03/06/21 03/06/22  Charlott Rakes, MD  methocarbamol (ROBAXIN) 500 MG tablet Take 1 tablet (500 mg total) by mouth 4 (four) times daily. 02/22/20   Fransico Meadow, PA-C  mupirocin ointment (BACTROBAN) 2 % Apply 1 application topically 2 (two) times daily. 10/26/20   Volney American, PA-C  cetirizine (ZYRTEC) 5 MG tablet Take 1 tablet (5 mg total) by mouth daily. 06/27/18 02/27/19  Loura Halt A, NP  fluticasone (FLONASE) 50 MCG/ACT nasal spray Place 1 spray  into both nostrils daily. 10/21/18 02/27/19  Raylene Everts, MD    Family History Family History  Problem Relation Age of Onset   Diabetes Mother    Arthritis Mother    Multiple sclerosis Daughter 96    Social History Social History   Tobacco Use   Smoking status: Former    Packs/day: 0.50    Years: 20.00    Pack years: 10.00    Types: Cigarettes    Quit date: 08/31/2001    Years since quitting: 20.0   Smokeless tobacco: Never  Substance Use Topics   Alcohol use: Yes    Alcohol/week: 1.0 standard drink    Types: 1 Cans of beer per week   Drug use: Yes    Types: Marijuana    Comment: last used 04/28/2015     Allergies   Nsaids and Losartan   Review of Systems Review of Systems  Respiratory:  Positive for cough.     Physical Exam Triage Vital Signs ED Triage Vitals [08/28/21 0833]  Enc Vitals Group     BP 134/90     Pulse Rate (!) 101     Resp 18     Temp 98.3 F (36.8 C)     Temp Source Oral     SpO2 100 %     Weight  Height      Head Circumference      Peak Flow      Pain Score 0     Pain Loc      Pain Edu?      Excl. in [place]?    No data found.  Updated Vital Signs BP 134/90 (BP Location: Left Arm)    Pulse (!) 101    Temp 98.3 F (36.8 C) (Oral)    Resp 18    SpO2 100%   Visual Acuity Right Eye Distance:   Left Eye Distance:   Bilateral Distance:    Right Eye Near:   Left Eye Near:    Bilateral Near:     Physical Exam Vitals reviewed.  Constitutional:      General: She is not in acute distress.    Appearance: She is not toxic-appearing.  HENT:     Right Ear: Tympanic membrane normal.     Left Ear: Tympanic membrane normal.     Nose: Nose normal.     Mouth/Throat:     Mouth: Mucous membranes are moist.     Pharynx: No oropharyngeal exudate or posterior oropharyngeal erythema.  Eyes:     Extraocular Movements: Extraocular movements intact.     Pupils: Pupils are equal, round, and reactive to light.  Cardiovascular:     Rate  and Rhythm: Normal rate and regular rhythm.     Heart sounds: No murmur heard. Pulmonary:     Effort: No respiratory distress.     Breath sounds: No stridor. No wheezing, rhonchi or rales.  Musculoskeletal:     Cervical back: Neck supple.  Lymphadenopathy:     Cervical: No cervical adenopathy.  Skin:    Coloration: Skin is not jaundiced or pale.  Neurological:     General: No focal deficit present.     Mental Status: She is alert and oriented to person, place, and time.  Psychiatric:        Behavior: Behavior normal.     UC Treatments / Results  Labs (all labs ordered are listed, but only abnormal results are displayed) Labs Reviewed  SARS CORONAVIRUS 2 (TAT 6-24 HRS)  POC INFLUENZA A AND B ANTIGEN (URGENT CARE ONLY)    EKG   Radiology No results found.  Procedures Procedures (including critical care time)  Medications Ordered in UC Medications - No data to display  Initial Impression / Assessment and Plan / UC Course  I have reviewed the triage vital signs and the nursing notes.  Pertinent labs & imaging results that were available during my care of the patient were reviewed by me and considered in my medical decision making (see chart for details).     Flu test negative. Final Clinical Impressions(s) / UC Diagnoses   Final diagnoses:  Viral URI with cough     Discharge Instructions      Your flu test was negative.   You have been swabbed for COVID, and the test will result in the next 24 hours. Our staff will call you if positive. If the test is positive, you should quarantine for 5 days.   Continue to take tylenol as needed for aches/fever, and your over the counter flu medicine as needed     ED Prescriptions   None    PDMP not reviewed this encounter.   Barrett Henle, MD 08/28/21 5621    Barrett Henle, MD 08/28/21 [id number]

## 2021-08-28 NOTE — ED Triage Notes (Signed)
Pt c/o cough, sore throat, fatigue, congestion, fever, and no appetite x 2days. Taking OTC with no relief.

## 2021-08-28 NOTE — Discharge Instructions (Addendum)
Your flu test was negative.   You have been swabbed for COVID, and the test will result in the next 24 hours. Our staff will call you if positive. If the test is positive, you should quarantine for 5 days.   Continue to take tylenol as needed for aches/fever, and your over the counter flu medicine as needed

## 2021-09-01 ENCOUNTER — Other Ambulatory Visit: Payer: Self-pay

## 2021-09-02 ENCOUNTER — Encounter (HOSPITAL_COMMUNITY): Payer: Self-pay

## 2021-09-02 ENCOUNTER — Telehealth (HOSPITAL_COMMUNITY): Payer: Self-pay | Admitting: Internal Medicine

## 2021-09-02 ENCOUNTER — Ambulatory Visit (HOSPITAL_COMMUNITY)
Admission: EM | Admit: 2021-09-02 | Discharge: 2021-09-02 | Disposition: A | Discharge status: Home / Self Care | Payer: BLUE CROSS/BLUE SHIELD | Attending: Student | Admitting: Student

## 2021-09-02 ENCOUNTER — Other Ambulatory Visit: Payer: Self-pay

## 2021-09-02 DIAGNOSIS — U071 COVID-19: Secondary | ICD-10-CM

## 2021-09-02 MED ORDER — PROMETHAZINE-DM 6.25-15 MG/5ML PO SYRP
5.0000 mL | ORAL_SOLUTION | Freq: Four times a day (QID) | ORAL | 0 refills | Status: DC | PRN
  Filled 2021-09-02: qty 118, 6d supply, fill #0

## 2021-09-02 MED ORDER — ALBUTEROL SULFATE HFA 108 (90 BASE) MCG/ACT IN AERS: 1.0000 | INHALATION_SPRAY | Freq: Four times a day (QID) | RESPIRATORY_TRACT | 0 refills | Status: DC | PRN

## 2021-09-02 MED ORDER — PROMETHAZINE-DM 6.25-15 MG/5ML PO SYRP: 5.0000 mL | ORAL_SOLUTION | Freq: Four times a day (QID) | ORAL | 0 refills | Status: DC | PRN

## 2021-09-02 MED ORDER — PREDNISONE 20 MG PO TABS: 40.0000 mg | ORAL_TABLET | Freq: Every day | ORAL | 0 refills | Status: AC

## 2021-09-02 MED ORDER — ALBUTEROL SULFATE HFA 108 (90 BASE) MCG/ACT IN AERS
1.0000 | INHALATION_SPRAY | Freq: Four times a day (QID) | RESPIRATORY_TRACT | 0 refills | Status: DC | PRN
  Filled 2021-09-02: qty 8.5, 25d supply, fill #0

## 2021-09-02 MED ORDER — PREDNISONE 20 MG PO TABS
40.0000 mg | ORAL_TABLET | Freq: Every day | ORAL | 0 refills | Status: DC
  Filled 2021-09-02: qty 10, 5d supply, fill #0

## 2021-09-02 NOTE — ED Triage Notes (Signed)
Pt reports testing positive for COVID last week. States she has not been able to eat or drink.   States her sxs have not improved.   Pt states the fever will not go away. States she has taken Tylenol and OTC medicine. States she has a headache.

## 2021-09-02 NOTE — Discharge Instructions (Addendum)
-  Promethazine DM cough syrup for congestion/cough. This could make you drowsy, so take at night before bed. -Prednisone, 2 pills taken at the same time for 5 days in a row.   -Albuterol inhaler as needed for cough, wheezing, shortness of breath, 1 to 2 puffs every 6 hours as needed. -You can continue tylenol (acetaminophen) up to 1000mg  3x daily. -Follow-up if symptoms getting worse: shortness of breath, fevers getting higher, chest pain, etc.

## 2021-09-02 NOTE — Telephone Encounter (Signed)
Medication sent to [facility] in [place] on 09/02/2021.

## 2021-09-02 NOTE — ED Provider Notes (Signed)
[facility] [facility]    CSN: [id number] Arrival date & time: 09/02/21  0962      History   Chief Complaint No chief complaint on file.   HPI Rebecca Marquez is a 60 y.o. female presenting with COVID-19 for 6-7 days.  Medical history hypertension, renal disorder.  Was last at our urgent care on 08/28/2021 on day 2 of symptoms, at that time she was diagnosed with COVID, antiviral was not sent.  States her symptoms have persisted since then, including low-grade fevers that reduce to 99.7 with Tylenol.  She is not able to take ibuprofen.  Minimal shortness of breath, no chest pain or dizziness.  Decreased appetite but tolerating fluids, denies nausea, vomiting, diarrhea.  Throbbing headaches.  Denies history of pulmonary disease.  Has taken relative's cough syrup with codeine which did help her sleep.  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      Home Medications    Prior to Admission medications   Medication Sig Start Date End Date Taking? Authorizing Provider  albuterol (VENTOLIN HFA) 108 (90 Base) MCG/ACT inhaler Inhale 1-2 puffs into the lungs every 6 (six) hours as needed for wheezing or shortness of breath. 09/02/21  Yes Hazel Sams, PA-C  predniSONE (DELTASONE) 20 MG tablet Take 2 tablets (40 mg total) by mouth daily for 5 days. Take with breakfast or lunch. Avoid NSAIDs  (ibuprofen, etc) while taking this medication. 09/02/21 09/07/21 Yes Hazel Sams, PA-C  promethazine-dextromethorphan (PROMETHAZINE-DM) 6.25-15 MG/5ML syrup Take 5 mLs by mouth 4 (four) times daily as needed for cough. 09/02/21  Yes Hazel Sams, PA-C  amLODipine (NORVASC) 10 MG tablet TAKE 1 TABLET (10 MG TOTAL) BY MOUTH DAILY. 03/06/21 03/06/22  Charlott Rakes, MD  chlorhexidine (HIBICLENS) 4 % external liquid Apply topically daily as needed. 10/26/20   Volney American, PA-C  cloNIDine (CATAPRES) 0.1 MG tablet TAKE 1 TABLET (0.1 MG TOTAL) BY MOUTH 3 (THREE) TIMES DAILY. 03/06/21 03/06/22  Charlott Rakes, MD  methocarbamol (ROBAXIN) 500 MG tablet Take 1 tablet (500 mg total) by mouth 4 (four) times daily. 02/22/20   Fransico Meadow, PA-C  mupirocin ointment (BACTROBAN) 2 % Apply 1 application topically 2 (two) times daily. 10/26/20   Volney American, PA-C  cetirizine (ZYRTEC) 5 MG tablet Take 1 tablet (5 mg total) by mouth daily. 06/27/18 02/27/19  Loura Halt A, NP  fluticasone (FLONASE) 50 MCG/ACT nasal spray Place 1 spray into both nostrils daily. 10/21/18 02/27/19  Raylene Everts, MD    Family History Family History  Problem Relation Age of Onset   Diabetes Mother    Arthritis Mother    Multiple sclerosis Daughter 20    Social History Social History   Tobacco Use   Smoking status: Former    Packs/day: 0.50  Years: 20.00    Pack years: 10.00    Types: Cigarettes    Quit date: 08/31/2001    Years since quitting: 20.0   Smokeless tobacco: Never  Substance Use Topics   Alcohol use: Yes    Alcohol/week: 1.0 standard drink    Types: 1 Cans of beer per week   Drug use: Yes    Types: Marijuana    Comment: last used 04/28/2015     Allergies   Nsaids and Losartan   Review of Systems Review of Systems  Constitutional:  Negative for appetite change, chills and fever.  HENT:  Positive for congestion. Negative for ear pain, rhinorrhea, sinus pressure, sinus pain and sore  throat.   Eyes:  Negative for redness and visual disturbance.  Respiratory:  Positive for cough. Negative for chest tightness, shortness of breath and wheezing.   Cardiovascular:  Negative for chest pain and palpitations.  Gastrointestinal:  Negative for abdominal pain, constipation, diarrhea, nausea and vomiting.  Genitourinary:  Negative for dysuria, frequency and urgency.  Musculoskeletal:  Negative for myalgias.  Neurological:  Negative for dizziness, weakness and headaches.  Psychiatric/Behavioral:  Negative for confusion.   All other systems reviewed and are negative.   Physical Exam Triage Vital Signs ED Triage Vitals  Enc Vitals Group     BP 09/02/21 0827 124/84     Pulse Rate 09/02/21 0827 100     Resp 09/02/21 0827 17     Temp 09/02/21 0827 100.2 F (37.9 C)     Temp Source 09/02/21 0827 Oral     SpO2 09/02/21 0827 96 %     Weight --      Height --      Head Circumference --      Peak Flow --      Pain Score 09/02/21 0825 4     Pain Loc --      Pain Edu? --      Excl. in [place]? --    No data found.  Updated Vital Signs BP 124/84 (BP Location: Right Arm)    Pulse 100    Temp 100.2 F (37.9 C) (Oral)    Resp 17    SpO2 96%   Visual Acuity Right Eye Distance:   Left Eye Distance:   Bilateral Distance:    Right Eye Near:   Left Eye Near:    Bilateral Near:     Physical Exam Vitals reviewed.  Constitutional:      General: She is not in acute distress.    Appearance: Normal appearance. She is ill-appearing.  HENT:     Head: Normocephalic and atraumatic.     Right Ear: Tympanic membrane, ear canal and external ear normal. No tenderness. No middle ear effusion. There is no impacted cerumen. Tympanic membrane is not perforated, erythematous, retracted or bulging.     Left Ear: Tympanic membrane, ear canal and external ear normal. No tenderness.  No middle ear effusion. There is no impacted cerumen. Tympanic membrane is not perforated, erythematous, retracted or  bulging.     Nose: Nose normal. No congestion.     Mouth/Throat:     Mouth: Mucous membranes are moist.     Pharynx: Uvula midline. No oropharyngeal exudate or posterior oropharyngeal erythema.  Eyes:     Extraocular Movements: Extraocular movements intact.     Pupils: Pupils are equal, round, and reactive to light.  Cardiovascular:     Rate and Rhythm: Normal rate and regular rhythm.  Heart sounds: Normal heart sounds.  Pulmonary:     Effort: Pulmonary effort is normal.     Breath sounds: Normal breath sounds. No decreased breath sounds, wheezing, rhonchi or rales.     Comments: Occ cough  Abdominal:     Palpations: Abdomen is soft.     Tenderness: There is no abdominal tenderness. There is no guarding or rebound.  Lymphadenopathy:     Cervical: No cervical adenopathy.     Right cervical: No superficial cervical adenopathy.    Left cervical: No superficial cervical adenopathy.  Neurological:     General: No focal deficit present.     Mental Status: She is alert and oriented to person, place, and time.  Psychiatric:        Mood and Affect: Mood normal.        Behavior: Behavior normal.        Thought Content: Thought content normal.        Judgment: Judgment normal.     UC Treatments / Results  Labs (all labs ordered are listed, but only abnormal results are displayed) Labs Reviewed - No data to display  EKG   Radiology No results found.  Procedures Procedures (including critical care time)  Medications Ordered in UC Medications - No data to display  Initial Impression / Assessment and Plan / UC Course  I have reviewed the triage vital signs and the nursing notes.  Pertinent labs & imaging results that were available during my care of the patient were reviewed by me and considered in my medical decision making (see chart for details).     This patient is a very pleasant 61 y.o. year old female presenting with covid-19. Borderline febrile and tachy. Last  antipyretic 14 hours ago. No history pulm ds per pt. positive COVID test at our clinic on 12/29, antiviral was not sent.  Symptoms for 6 days.  Low suspicion for pneumonia given lungs clear to auscultation, no shortness of breath, no history of pulm disease. Will manage with prednisone, albuterol, promethazine.  Strict return precautions discussed.  Additional work note provided.  Coding Level 4 for acute illness with systemic symptoms, and prescription drug management  Final Clinical Impressions(s) / UC Diagnoses   Final diagnoses:  COVID-19     Discharge Instructions      -Promethazine DM cough syrup for congestion/cough. This could make you drowsy, so take at night before bed. -Prednisone, 2 pills taken at the same time for 5 days in a row.   -Albuterol inhaler as needed for cough, wheezing, shortness of breath, 1 to 2 puffs every 6 hours as needed. -You can continue tylenol (acetaminophen) up to 1000mg  3x daily. -Follow-up if symptoms getting worse: shortness of breath, fevers getting higher, chest pain, etc.       ED Prescriptions     Medication Sig Dispense Auth. Provider   predniSONE (DELTASONE) 20 MG tablet Take 2 tablets (40 mg total) by mouth daily for 5 days. Take with breakfast or lunch. Avoid NSAIDs (ibuprofen, etc) while taking this medication. 10 tablet Hazel Sams, PA-C   albuterol (VENTOLIN HFA) 108 (90 Base) MCG/ACT inhaler Inhale 1-2 puffs into the lungs every 6 (six) hours as needed for wheezing or shortness of breath. 18 g Hazel Sams, PA-C   promethazine-dextromethorphan (PROMETHAZINE-DM) 6.25-15 MG/5ML syrup Take 5 mLs by mouth 4 (four) times daily as needed for cough. 118 mL Hazel Sams, PA-C      PDMP not reviewed this encounter.  Hazel Sams, PA-C 09/02/21 [phone]

## 2021-09-14 ENCOUNTER — Other Ambulatory Visit: Payer: Self-pay | Admitting: Family Medicine

## 2021-09-14 DIAGNOSIS — N183 Chronic kidney disease, stage 3 unspecified: Secondary | ICD-10-CM

## 2021-09-14 MED ORDER — AMLODIPINE BESYLATE 10 MG PO TABS
ORAL_TABLET | Freq: Every day | ORAL | 0 refills | Status: DC
  Filled 2021-09-14: qty 90, fill #0
  Filled 2021-09-15: qty 30, 30d supply, fill #0

## 2021-09-14 NOTE — Telephone Encounter (Signed)
Requested Prescriptions  Pending Prescriptions Disp Refills   amLODipine (NORVASC) 10 MG tablet 30 tablet 0    Sig: TAKE 1 TABLET (10 MG TOTAL) BY MOUTH DAILY.     Cardiovascular:  Calcium Channel Blockers Passed - 09/14/2021  6:25 PM      Passed - Last BP in normal range    BP Readings from Last 1 Encounters:  09/02/21 124/84         Passed - Valid encounter within last 6 months    Recent Outpatient Visits          4 months ago Annual physical exam   [place], [place], [place]   6 months ago CKD stage G3b/A1, GFR 30-44 and albumin creatinine ratio <30 mg/g [facility])   [place], [place], MD   10 months ago Pain, dental   [place], [place], MD   1 year ago Renovascular hypertension   [place], MD   1 year ago Renovascular hypertension   [place], MD      Future Appointments            In 1 month Charlott Rakes, MD [place]

## 2021-09-15 ENCOUNTER — Other Ambulatory Visit: Payer: Self-pay

## 2021-09-15 MED ORDER — AMLODIPINE BESYLATE 5 MG PO TABS
ORAL_TABLET | ORAL | 11 refills | Status: DC
  Filled 2021-09-15: qty 30, 30d supply, fill #0
  Filled 2021-10-14: qty 30, 30d supply, fill #1
  Filled 2021-11-11: qty 30, 30d supply, fill #2
  Filled 2021-12-15: qty 30, 30d supply, fill #3
  Filled 2022-01-13: qty 30, 30d supply, fill #4
  Filled 2022-02-16: qty 30, 30d supply, fill #5
  Filled 2022-03-13: qty 30, 30d supply, fill #6
  Filled 2022-04-11: qty 30, 30d supply, fill #7
  Filled 2022-05-20: qty 30, 30d supply, fill #8
  Filled 2022-06-21: qty 30, 30d supply, fill #9

## 2021-09-17 ENCOUNTER — Encounter: Payer: Self-pay | Admitting: Gastroenterology

## 2021-09-17 ENCOUNTER — Other Ambulatory Visit: Payer: Self-pay | Admitting: Nephrology

## 2021-09-17 DIAGNOSIS — N184 Chronic kidney disease, stage 4 (severe): Secondary | ICD-10-CM

## 2021-09-25 ENCOUNTER — Ambulatory Visit
Admission: RE | Admit: 2021-09-25 | Discharge: 2021-09-25 | Disposition: A | Discharge status: Home / Self Care | Payer: BLUE CROSS/BLUE SHIELD | Source: Ambulatory Visit | Attending: Nephrology | Admitting: Nephrology

## 2021-09-25 DIAGNOSIS — N184 Chronic kidney disease, stage 4 (severe): Secondary | ICD-10-CM

## 2021-10-02 ENCOUNTER — Other Ambulatory Visit: Payer: Self-pay | Admitting: Family Medicine

## 2021-10-02 ENCOUNTER — Other Ambulatory Visit: Payer: Self-pay

## 2021-10-02 DIAGNOSIS — N183 Chronic kidney disease, stage 3 unspecified: Secondary | ICD-10-CM

## 2021-10-02 MED ORDER — CLONIDINE HCL 0.1 MG PO TABS
ORAL_TABLET | ORAL | 1 refills | Status: DC
  Filled 2021-10-02: qty 90, 30d supply, fill #0
  Filled 2021-10-02: qty 270, fill #0
  Filled 2021-11-11: qty 90, 30d supply, fill #1
  Filled 2021-12-15: qty 90, 30d supply, fill #2
  Filled 2022-01-13: qty 90, 30d supply, fill #3
  Filled 2022-02-16: qty 90, 30d supply, fill #4
  Filled 2022-03-13: qty 90, 30d supply, fill #5

## 2021-10-02 NOTE — Telephone Encounter (Signed)
Requested Prescriptions  Pending Prescriptions Disp Refills   cloNIDine (CATAPRES) 0.1 MG tablet 270 tablet 1    Sig: TAKE 1 TABLET (0.1 MG TOTAL) BY MOUTH 3 (THREE) TIMES DAILY.     Cardiovascular:  Alpha-2 Agonists Passed - 10/02/2021  1:24 PM      Passed - Last BP in normal range    BP Readings from Last 1 Encounters:  09/02/21 124/84         Passed - Last Heart Rate in normal range    Pulse Readings from Last 1 Encounters:  09/02/21 100         Passed - Valid encounter within last 6 months    Recent Outpatient Visits          5 months ago Annual physical exam   [place], [place], MD   7 months ago CKD stage G3b/A1, GFR 30-44 and albumin creatinine ratio <30 mg/g [facility])   [place], [place], MD   10 months ago Pain, dental   [place], [place], MD   1 year ago Renovascular hypertension   [place], MD   1 year ago Renovascular hypertension   [place], MD      Future Appointments            In 3 weeks Charlott Rakes, MD [place]

## 2021-10-03 ENCOUNTER — Other Ambulatory Visit: Payer: Self-pay

## 2021-10-07 ENCOUNTER — Other Ambulatory Visit: Payer: Self-pay

## 2021-10-14 ENCOUNTER — Other Ambulatory Visit: Payer: Self-pay

## 2021-10-15 ENCOUNTER — Ambulatory Visit (AMBULATORY_SURGERY_CENTER): Payer: 59 | Admitting: *Deleted

## 2021-10-15 ENCOUNTER — Other Ambulatory Visit: Payer: Self-pay

## 2021-10-15 VITALS — Ht 63.5 in | Wt 107.0 lb

## 2021-10-15 DIAGNOSIS — Z1211 Encounter for screening for malignant neoplasm of colon: Secondary | ICD-10-CM

## 2021-10-15 NOTE — Progress Notes (Signed)

## 2021-10-24 ENCOUNTER — Encounter: Payer: Self-pay | Admitting: Gastroenterology

## 2021-10-28 ENCOUNTER — Ambulatory Visit: Payer: 59 | Attending: Family Medicine | Admitting: Family Medicine

## 2021-10-28 ENCOUNTER — Other Ambulatory Visit: Payer: Self-pay

## 2021-10-28 ENCOUNTER — Encounter: Payer: Self-pay | Admitting: Family Medicine

## 2021-10-28 VITALS — BP 139/90 | HR 67 | Ht 63.0 in | Wt 109.0 lb

## 2021-10-28 DIAGNOSIS — N184 Chronic kidney disease, stage 4 (severe): Secondary | ICD-10-CM

## 2021-10-28 DIAGNOSIS — K648 Other hemorrhoids: Secondary | ICD-10-CM

## 2021-10-28 DIAGNOSIS — I129 Hypertensive chronic kidney disease with stage 1 through stage 4 chronic kidney disease, or unspecified chronic kidney disease: Secondary | ICD-10-CM | POA: Diagnosis not present

## 2021-10-28 NOTE — Progress Notes (Signed)
No concerns today 

## 2021-10-28 NOTE — Patient Instructions (Signed)

## 2021-10-28 NOTE — Progress Notes (Signed)
Subjective:  Patient ID: Rebecca Marquez, female    DOB: 07-30-1961  Age: 61 y.o. MRN: [id number]  CC: Hypertension   HPI Rebecca Marquez is a 61 y.o. year old female with a history of hypertension, stage III chronic kidney disease (followed by Nephrology)  Interval History: Nephrology visit was last month and per patient she was informed everything was stable.  She also had blood work.  She is doing well on her antihypertensive and has no adverse effects from her medication. She just took her Amlodipine a few minutes before this appointment. She endorses some internal hemorrhoids which sometimes bleeds.  She has used over-the-counter hemorrhoid suppositories with no relief in symptoms. Past Medical History:  Diagnosis Date   Hypertension Dx March 2015   Kidney disease    Renal disorder    R/T HTN   Seasonal allergies     Past Surgical History:  Procedure Laterality Date   TUBAL LIGATION      Family History  Problem Relation Age of Onset   Diabetes Mother    Arthritis Mother    Multiple sclerosis Daughter 19   Colon cancer Neg Hx    Colon polyps Neg Hx    Esophageal cancer Neg Hx    Stomach cancer Neg Hx    Rectal cancer Neg Hx     Allergies  Allergen Reactions   Nsaids Other (See Comments)    ckd   Losartan Tinitus    Outpatient Medications Prior to Visit  Medication Sig Dispense Refill   amLODipine (NORVASC) 5 MG tablet Take 1 tablet by mouth daily. 30 tablet 11   cloNIDine (CATAPRES) 0.1 MG tablet TAKE 1 TABLET (0.1 MG TOTAL) BY MOUTH 3 (THREE) TIMES DAILY. 270 tablet 1   albuterol (VENTOLIN HFA) 108 (90 Base) MCG/ACT inhaler Inhale 1-2 puffs into the lungs every 6 (six) hours as needed for wheezing or shortness of breath. (Patient not taking: Reported on 10/15/2021) 8.5 g 0   amLODipine (NORVASC) 10 MG tablet TAKE 1 TABLET (10 MG TOTAL) BY MOUTH DAILY. 90 tablet 0   chlorhexidine (HIBICLENS) 4 % external liquid Apply topically daily as needed. (Patient not  taking: Reported on 10/15/2021) 120 mL 0   methocarbamol (ROBAXIN) 500 MG tablet Take 1 tablet (500 mg total) by mouth 4 (four) times daily. (Patient not taking: Reported on 10/15/2021) 20 tablet 0   mupirocin ointment (BACTROBAN) 2 % Apply 1 application topically 2 (two) times daily. (Patient not taking: Reported on 10/15/2021) 22 g 0   promethazine-dextromethorphan (PROMETHAZINE-DM) 6.25-15 MG/5ML syrup Take 5 mLs by mouth 4 (four) times daily as needed for cough. (Patient not taking: Reported on 10/15/2021) 118 mL 0   No facility-administered medications prior to visit.     ROS Review of Systems  Constitutional:  Negative for activity change, appetite change and fatigue.  HENT:  Negative for congestion, sinus pressure and sore throat.   Eyes:  Negative for visual disturbance.  Respiratory:  Negative for cough, chest tightness, shortness of breath and wheezing.   Cardiovascular:  Negative for chest pain and palpitations.  Gastrointestinal:  Positive for blood in stool. Negative for abdominal distention, abdominal pain and constipation.  Endocrine: Negative for polydipsia.  Genitourinary:  Negative for dysuria and frequency.  Musculoskeletal:  Negative for arthralgias and back pain.  Skin:  Negative for rash.  Neurological:  Negative for tremors, light-headedness and numbness.  Hematological:  Does not bruise/bleed easily.  Psychiatric/Behavioral:  Negative for agitation and behavioral problems.  Objective:  BP 139/90    Pulse 67    Ht '5\' 3"'  (1.6 m)    Wt 109 lb (49.4 kg)    SpO2 100%    BMI 19.31 kg/m   BP/Weight 10/28/2021 2/83/6629 12/05/6544  Systolic BP 503 - 546  Diastolic BP 90 - 84  Wt. (Lbs) 109 107 -  BMI 19.31 18.66 -      Physical Exam Constitutional:      Appearance: She is well-developed.  Cardiovascular:     Rate and Rhythm: Normal rate.     Heart sounds: Normal heart sounds. No murmur heard. Pulmonary:     Effort: Pulmonary effort is normal.     Breath  sounds: Normal breath sounds. No wheezing or rales.  Chest:     Chest wall: No tenderness.  Abdominal:     General: Bowel sounds are normal. There is no distension.     Palpations: Abdomen is soft. There is no mass.     Tenderness: There is no abdominal tenderness.  Musculoskeletal:        General: Normal range of motion.     Right lower leg: No edema.     Left lower leg: No edema.  Neurological:     Mental Status: She is alert and oriented to person, place, and time.  Psychiatric:        Mood and Affect: Mood normal.    CMP Latest Ref Rng & Units 04/24/2021 11/14/2020 05/08/2020  Glucose 65 - 99 mg/dL 83 93 98  BUN 6 - 24 mg/dL 29(H) 28(H) 30(H)  Creatinine 0.57 - 1.00 mg/dL 2.08(H) 2.11(H) 2.10(H)  Sodium 134 - 144 mmol/L 139 139 143  Potassium 3.5 - 5.2 mmol/L 3.9 4.4 4.5  Chloride 96 - 106 mmol/L 102 104 107(H)  CO2 20 - 29 mmol/L 21 18(L) 20  Calcium 8.7 - 10.2 mg/dL 9.4 9.4 9.1  Total Protein 6.0 - 8.5 g/dL - 7.6 -  Total Bilirubin 0.0 - 1.2 mg/dL - 0.2 -  Alkaline Phos 44 - 121 IU/L - 134(H) -  AST 0 - 40 IU/L - 29 -  ALT 0 - 32 IU/L - 21 -    Lipid Panel     Component Value Date/Time   CHOL 206 (H) 01/03/2020 1055   TRIG 120 01/03/2020 1055   HDL 64 01/03/2020 1055   CHOLHDL 3.2 01/03/2020 1055   CHOLHDL 4.0 09/25/2016 0949   VLDL 17 09/25/2016 0949   LDLCALC 121 (H) 01/03/2020 1055    CBC    Component Value Date/Time   WBC 4.0 05/08/2020 1000   WBC 5.0 01/19/2017 2118   RBC 4.38 05/08/2020 1000   RBC 4.41 01/19/2017 2118   HGB 15.0 05/08/2020 1000   HCT 43.3 05/08/2020 1000   PLT 222 05/08/2020 1000   MCV 99 (H) 05/08/2020 1000   MCH 34.2 (H) 05/08/2020 1000   MCH 31.5 01/19/2017 2118   MCHC 34.6 05/08/2020 1000   MCHC 33.4 01/19/2017 2118   RDW 14.0 05/08/2020 1000   LYMPHSABS 2.0 01/19/2017 2118   MONOABS 0.3 01/19/2017 2118   EOSABS 0.3 01/19/2017 2118   BASOSABS 0.1 01/19/2017 2118    Lab Results  Component Value Date   HGBA1C 5.6  01/03/2020    Assessment & Plan:  1. Benign hypertension with chronic kidney disease, stage IV (HCC) Slight diastolic elevation No regimen change due to the fact that she just took her antihypertensive a few minutes ago She does have hypertensive nephropathy Avoid  nephrotoxins and continue to follow-up with nephrology for chronic kidney disease Counseled on blood pressure goal of less than 130/80, low-sodium, DASH diet, medication compliance, 150 minutes of moderate intensity exercise per week. Discussed medication compliance, adverse effects. - CMP14+EGFR  2. Internal hemorrhoid Counseled on measures to prevent constipation by increasing fiber intake Would love to prescribe Analpram but she states it has been ineffective Continue to use sitz bath's She would like to proceed with colonoscopy and after procedure she will reach out to me for a possible referral for surgical management if this is not addressed during her colonoscopy.   Health Care Maintenance: Colonoscopy comes up tomorrow No orders of the defined types were placed in this encounter.   Follow-up: Return in about 6 months (around 04/27/2022) for Chronic medical conditions.       Charlott Rakes, MD, FAAFP. [facility] and [facility], [place]   10/28/2021, 10:13 AM

## 2021-10-29 ENCOUNTER — Ambulatory Visit (AMBULATORY_SURGERY_CENTER): Payer: 59 | Admitting: Gastroenterology

## 2021-10-29 ENCOUNTER — Other Ambulatory Visit: Payer: Self-pay | Admitting: Family Medicine

## 2021-10-29 ENCOUNTER — Encounter: Payer: Self-pay | Admitting: Gastroenterology

## 2021-10-29 VITALS — BP 128/93 | HR 55 | Temp 97.6°F | Resp 13 | Ht 63.0 in | Wt 102.0 lb

## 2021-10-29 DIAGNOSIS — Z1211 Encounter for screening for malignant neoplasm of colon: Secondary | ICD-10-CM | POA: Diagnosis present

## 2021-10-29 DIAGNOSIS — D125 Benign neoplasm of sigmoid colon: Secondary | ICD-10-CM | POA: Diagnosis not present

## 2021-10-29 LAB — CMP14+EGFR
ALT: 17 IU/L (ref 0–32)
AST: 26 IU/L (ref 0–40)
Albumin/Globulin Ratio: 1.5 (ref 1.2–2.2)
Albumin: 4.1 g/dL (ref 3.8–4.9)
Alkaline Phosphatase: 128 IU/L — ABNORMAL HIGH (ref 44–121)
BUN/Creatinine Ratio: 17 (ref 9–23)
BUN: 36 mg/dL — ABNORMAL HIGH (ref 6–24)
Bilirubin Total: 0.3 mg/dL (ref 0.0–1.2)
CO2: 20 mmol/L (ref 20–29)
Calcium: 9.6 mg/dL (ref 8.7–10.2)
Chloride: 105 mmol/L (ref 96–106)
Creatinine, Ser: 2.06 mg/dL — ABNORMAL HIGH (ref 0.57–1.00)
Globulin, Total: 2.8 g/dL (ref 1.5–4.5)
Glucose: 72 mg/dL (ref 70–99)
Potassium: 4.2 mmol/L (ref 3.5–5.2)
Sodium: 139 mmol/L (ref 134–144)
Total Protein: 6.9 g/dL (ref 6.0–8.5)
eGFR: 27 mL/min/{1.73_m2} — ABNORMAL LOW (ref >59)

## 2021-10-29 NOTE — Patient Instructions (Signed)
Discharge instructions given. ?Handouts on polyps and hemorrhoids. ?No NSAIDS for 2 weeks. ?Use FiberCon 1-2 tablets by mouth everyday. ?Resume previous medications. ?Card for clips given. ?YOU HAD AN ENDOSCOPIC PROCEDURE TODAY AT [facility]:   Refer to the procedure report that was given to you for any specific questions about what was found during the examination.  If the procedure report does not answer your questions, please call your gastroenterologist to clarify.  If you requested that your care partner not be given the details of your procedure findings, then the procedure report has been included in a sealed envelope for you to review at your convenience later. ? ?YOU SHOULD EXPECT: Some feelings of bloating in the abdomen. Passage of more gas than usual.  Walking can help get rid of the air that was put into your GI tract during the procedure and reduce the bloating. If you had a lower endoscopy (such as a colonoscopy or flexible sigmoidoscopy) you may notice spotting of blood in your stool or on the toilet paper. If you underwent a bowel prep for your procedure, you may not have a normal bowel movement for a few days. ? ?Please Note:  You might notice some irritation and congestion in your nose or some drainage.  This is from the oxygen used during your procedure.  There is no need for concern and it should clear up in a day or so. ? ?SYMPTOMS TO REPORT IMMEDIATELY: ? ?Following lower endoscopy (colonoscopy or flexible sigmoidoscopy): ? Excessive amounts of blood in the stool ? Significant tenderness or worsening of abdominal pains ? Swelling of the abdomen that is new, acute ? Fever of 100?F or higher ? ? ?For urgent or emergent issues, a gastroenterologist can be reached at any hour by calling [phone]. ?Do not use MyChart messaging for urgent concerns.  ? ? ?DIET:  We do recommend a small meal at first, but then you may proceed to your regular diet.  Drink plenty of fluids but  you should avoid alcoholic beverages for 24 hours. ? ?ACTIVITY:  You should plan to take it easy for the rest of today and you should NOT DRIVE or use heavy machinery until tomorrow (because of the sedation medicines used during the test).   ? ?FOLLOW UP: ?Our staff will call the number listed on your records 48-72 hours following your procedure to check on you and address any questions or concerns that you may have regarding the information given to you following your procedure. If we do not reach you, we will leave a message.  We will attempt to reach you two times.  During this call, we will ask if you have developed any symptoms of COVID 19. If you develop any symptoms (ie: fever, flu-like symptoms, shortness of breath, cough etc.) before then, please call [phone].  If you test positive for Covid 19 in the 2 weeks post procedure, please call and report this information to [place].   ? ?If any biopsies were taken you will be contacted by phone or by letter within the next 1-3 weeks.  Please call us at [phone] if you have not heard about the biopsies in 3 weeks.  ? ? ?SIGNATURES/CONFIDENTIALITY: ?You and/or your care partner have signed paperwork which will be entered into your electronic medical record.  These signatures attest to the fact that that the information above on your After Visit Summary has been reviewed and is understood.  Full responsibility of the confidentiality of this  discharge information lies with you and/or your care-partner.  ? ?

## 2021-10-29 NOTE — Progress Notes (Signed)
? ?GASTROENTEROLOGY PROCEDURE H&P NOTE  ? ?Primary Care Physician: ?Charlott Rakes, MD ? ?HPI: ?Rebecca Marquez is a 61 y.o. female who presents for Colonoscopy for screening. ? ?Past Medical History:  ?Diagnosis Date  ? Hypertension Dx March 2015  ? Kidney disease   ? Renal disorder   ? R/T HTN  ? Seasonal allergies   ? ?Past Surgical History:  ?Procedure Laterality Date  ? TUBAL LIGATION    ? ?Current Outpatient Medications  ?Medication Sig Dispense Refill  ? amLODipine (NORVASC) 5 MG tablet Take 1 tablet by mouth daily. 30 tablet 11  ? cloNIDine (CATAPRES) 0.1 MG tablet TAKE 1 TABLET (0.1 MG TOTAL) BY MOUTH 3 (THREE) TIMES DAILY. 270 tablet 1  ? ?No current facility-administered medications for this visit.  ? ? ?Current Outpatient Medications:  ?  amLODipine (NORVASC) 5 MG tablet, Take 1 tablet by mouth daily., Disp: 30 tablet, Rfl: 11 ?  cloNIDine (CATAPRES) 0.1 MG tablet, TAKE 1 TABLET (0.1 MG TOTAL) BY MOUTH 3 (THREE) TIMES DAILY., Disp: 270 tablet, Rfl: 1 ?Allergies  ?Allergen Reactions  ? Nsaids Other (See Comments)  ?  ckd  ? Losartan Tinitus  ? ?Family History  ?Problem Relation Age of Onset  ? Diabetes Mother   ? Arthritis Mother   ? Multiple sclerosis Daughter 69  ? Colon cancer Neg Hx   ? Colon polyps Neg Hx   ? Esophageal cancer Neg Hx   ? Stomach cancer Neg Hx   ? Rectal cancer Neg Hx   ? ?Social History  ? ?Socioeconomic History  ? Marital status: Legally Separated  ?  Spouse name: Not on file  ? Number of children: Not on file  ? Years of education: Not on file  ? Highest education level: Not on file  ?Occupational History  ? Not on file  ?Tobacco Use  ? Smoking status: Former  ?  Packs/day: 0.50  ?  Years: 20.00  ?  Pack years: 10.00  ?  Types: Cigarettes  ?  Quit date: 08/31/2001  ?  Years since quitting: 20.1  ? Smokeless tobacco: Never  ?Vaping Use  ? Vaping Use: Never used  ?Substance and Sexual Activity  ? Alcohol use: Yes  ?  Alcohol/week: 1.0 standard drink  ?  Types: 1 Cans of beer per  week  ?  Comment: occasional  ? Drug use: Yes  ?  Types: Marijuana  ?  Comment: once a day  ? Sexual activity: Not on file  ?Other Topics Concern  ? Not on file  ?Social History Narrative  ? Not on file  ? ?Social Determinants of Health  ? ?Financial Resource Strain: Not on file  ?Food Insecurity: Not on file  ?Transportation Needs: Not on file  ?Physical Activity: Not on file  ?Stress: Not on file  ?Social Connections: Not on file  ?Intimate Partner Violence: Not on file  ? ? ?Physical Exam: ?Today's Vitals  ? 10/29/21 8088 10/29/21 1103  ?BP: (!) 162/103   ?Pulse: 60   ?Temp: 97.6 ?F (36.4 ?C) 97.6 ?F (36.4 ?C)  ?SpO2: 100%   ?Weight: 102 lb (46.3 kg)   ?Height: 5\' 3"  (1.6 m)   ?PainSc: 0-No pain   ? ?Body mass index is 18.07 kg/m?. ?GEN: NAD ?EYE: Sclerae anicteric ?ENT: MMM ?CV: Non-tachycardic ?GI: Soft, NT/ND ?NEURO:  Alert & Oriented x 3 ? ?Lab Results: ?No results for input(s): WBC, HGB, HCT, PLT in the last 72 hours. ?BMET ?Recent Labs  ?  10/28/21 ?0940  ?NA 139  ?K 4.2  ?CL 105  ?CO2 20  ?GLUCOSE 72  ?BUN 36*  ?CREATININE 2.06*  ?CALCIUM 9.6  ? ?LFT ?Recent Labs  ?  10/28/21 ?0940  ?PROT 6.9  ?ALBUMIN 4.1  ?AST 26  ?ALT 17  ?ALKPHOS 128*  ?BILITOT 0.3  ? ?PT/INR ?No results for input(s): LABPROT, INR in the last 72 hours. ? ? ?Impression / Plan: ?This is a 61 y.o.female who presents for Colonoscopy for screening. ? ?The risks and benefits of endoscopic evaluation/treatment were discussed with the patient and/or family; these include but are not limited to the risk of perforation, infection, bleeding, missed lesions, lack of diagnosis, severe illness requiring hospitalization, as well as anesthesia and sedation related illnesses.  The patient's history has been reviewed, patient examined, no change in status, and deemed stable for procedure.  The patient and/or family is agreeable to proceed.  ? ? ?Justice Britain, MD ?[facility] ?Advanced Endoscopy ?Office # [phone] ? ?

## 2021-10-29 NOTE — Progress Notes (Signed)
To Pacu, VSS. Report to [email or web address] 

## 2021-10-29 NOTE — Op Note (Signed)
[place] ?Patient Name: Rebecca Marquez ?Procedure Date: 10/29/2021 9:42 AM ?MRN: [id number] ?Endoscopist: Justice Britain , MD ?Age: 61 ?Referring MD:  ?Date of Birth: Nov 16, 1960 ?Gender: Female ?Account #: [id number] ?Procedure:                Colonoscopy ?Indications:              Screening for colorectal malignant neoplasm ?Medicines:                Monitored Anesthesia Care ?Procedure:                Pre-Anesthesia Assessment: ?                          - Prior to the procedure, a History and Physical  ?                          was performed, and patient medications and  ?                          allergies were reviewed. The patient's tolerance of  ?                          previous anesthesia was also reviewed. The risks  ?                          and benefits of the procedure and the sedation  ?                          options and risks were discussed with the patient.  ?                          All questions were answered, and informed consent  ?                          was obtained. Prior Anticoagulants: The patient has  ?                          taken no previous anticoagulant or antiplatelet  ?                          agents. ASA Grade Assessment: II - A patient with  ?                          mild systemic disease. After reviewing the risks  ?                          and benefits, the patient was deemed in  ?                          satisfactory condition to undergo the procedure. ?                          After obtaining informed consent, the colonoscope  ?  was passed under direct vision. Throughout the  ?                          procedure, the patient's blood pressure, pulse, and  ?                          oxygen saturations were monitored continuously. The  ?                          PCF-HQ190L Colonoscope was introduced through the  ?                          anus and advanced to the 5 cm into the ileum. The  ?                          colonoscopy was  performed without difficulty. The  ?                          patient tolerated the procedure. The quality of the  ?                          bowel preparation was adequate. The terminal ileum,  ?                          ileocecal valve, appendiceal orifice, and rectum  ?                          were photographed. ?Scope In: 10:01:07 AM ?Scope Out: 10:42:18 AM ?Scope Withdrawal Time: 0 hours 38 minutes 3 seconds  ?Total Procedure Duration: 0 hours 41 minutes 11 seconds  ?Findings:                 The digital rectal exam findings include  ?                          hemorrhoids. Pertinent negatives include no  ?                          palpable rectal lesions. ?                          The terminal ileum and ileocecal valve appeared  ?                          normal. ?                          A 30 mm polyp was found in the sigmoid colon. The  ?                          polyp was semi-pedunculated. Preparations were made  ?                          for mucosal resection. NBI imaging and White-light  ?  endoscopy was done to demarcate the borders of the  ?                          lesion. A 1:10,000 solution of epinephrine was  ?                          injected to raise the lesion (total of 1.5 mL).  ?                          Olympus polyloop was placed over the polyp with  ?                          care. Attempt at enbloc resection was performed,  ?                          however a very small piece of tissue above the  ?                          polylooop was present so a second pice was removed  ?                          using mucosal resection technique. Resection and  ?                          retrieval were complete. To prevent bleeding after  ?                          mucosal resection, one hemostatic clip was  ?                          successfully placed (MR conditional) over the  ?                          polyloop. There was no bleeding during, or at the  ?                           end, of the procedure. Area was tattooed with an  ?                          injection of Spot (carbon black) on contralateral  ?                          side. ?                          A 40 mm polyp was found in the sigmoid colon. The  ?                          polyp was pedunculated. Preparations were made for  ?                          mucosal resection. NBI imaging and White-light  ?  endoscopy was done to demarcate the borders of the  ?                          lesion. A 1:10,000 solution of epinephrine was  ?                          injected to raise the lesion (total of 2.0 mL).  ?                          Olympus polyloop was placed over teh polyp with  ?                          care. Snare mucosal resection was performed.  ?                          Resection and retrieval were complete. To prevent  ?                          bleeding after mucosal resection, one hemostatic  ?                          clip was successfully placed (MR conditional) on  ?                          the stalk. There was no bleeding during, or at the  ?                          end, of the procedure. Area was tattooed with an  ?                          injection of Spot (carbon black) on contralateral  ?                          side. ?                          Normal mucosa was found in the entire colon  ?                          otherwise. ?                          Non-bleeding non-thrombosed internal hemorrhoids  ?                          were found during retroflexion, during perianal  ?                          exam and during digital exam. The hemorrhoids were  ?                          Grade II (internal hemorrhoids that prolapse but  ?  reduce spontaneously). ?Complications:            No immediate complications. ?Estimated Blood Loss:     Estimated blood loss was minimal. ?Impression:               - Hemorrhoids found on digital rectal exam. ?                           - The examined portion of the ileum was normal. ?                          - One 30 mm polyp in the sigmoid colon, removed  ?                          with piecemeal mucosal resection after polyloop.  ?                          Resected and retrieved. Clip (MR conditional) was  ?                          placed. ?                          - One 40 mm polyp in the sigmoid colon, removed  ?                          with mucosal resection after polyloop. Resected and  ?                          retrieved. Clip (MR conditional) was placed. ?                          - Normal mucosa in the entire examined colon  ?                          otherwise. ?                          - Non-bleeding non-thrombosed internal hemorrhoids. ?Recommendation:           - The patient will be observed post-procedure,  ?                          until all discharge criteria are met. ?                          - Discharge patient to home. ?                          - Patient has a contact number available for  ?                          emergencies. The signs and symptoms of potential  ?                          delayed complications were discussed with the  ?  patient. Return to normal activities tomorrow.  ?                          Written discharge instructions were provided to the  ?                          patient. ?                          - High fiber diet. ?                          - Use FiberCon 1-2 tablets PO daily. ?                          - No NSAIDs for 2-weeks. ?                          - Repeat colonoscopy in 1 year for surveillance due  ?                          to piecemeal resection of large polyp (but always  ?                          pending final pathology). ?                          - The findings and recommendations were discussed  ?                          with the patient. ?                          - The findings and recommendations were discussed  ?                           with the designated responsible adult. ?Justice Britain, MD ?10/29/2021 10:51:48 AM ?

## 2021-10-29 NOTE — Progress Notes (Signed)
Rebecca Marquez vitals ?

## 2021-10-29 NOTE — Progress Notes (Signed)
Called to room to assist during endoscopic procedure.  Patient ID and intended procedure confirmed with present staff. Received instructions for my participation in the procedure from the performing physician.  

## 2021-10-31 ENCOUNTER — Telehealth: Payer: Self-pay | Admitting: *Deleted

## 2021-10-31 ENCOUNTER — Telehealth: Payer: Self-pay

## 2021-10-31 NOTE — Telephone Encounter (Signed)
?  Follow up Call- ? ?Call back number 10/29/2021  ?Post procedure Call Back phone  # [phone]  ?Permission to leave phone message Yes  ?Some recent data might be hidden  ?  ? ?Patient questions: ? ? ?Message left to call if necessary. ?

## 2021-10-31 NOTE — Telephone Encounter (Signed)
?  Follow up Call- ? ?Call back number 10/29/2021  ?Post procedure Call Back phone  # [phone]  ?Permission to leave phone message Yes  ?Some recent data might be hidden  ?  ? ?Patient questions: ? ?Do you have a fever, pain , or abdominal swelling? No. ?Pain Score  0 * ? ?Have you tolerated food without any problems? Yes.   ? ?Have you been able to return to your normal activities? Yes.   ? ?Do you have any questions about your discharge instructions: ?Diet   No. ?Medications  No. ?Follow up visit  No. ? ?Do you have questions or concerns about your Care? No. ? ?Actions: ?* If pain score is 4 or above: ?No action needed, pain <4. ? ? ?

## 2021-11-03 ENCOUNTER — Encounter: Payer: Self-pay | Admitting: Gastroenterology

## 2021-11-11 ENCOUNTER — Other Ambulatory Visit: Payer: Self-pay

## 2021-11-12 ENCOUNTER — Other Ambulatory Visit: Payer: Self-pay

## 2021-12-15 ENCOUNTER — Other Ambulatory Visit: Payer: Self-pay

## 2021-12-23 ENCOUNTER — Encounter (HOSPITAL_COMMUNITY): Payer: Self-pay

## 2021-12-23 ENCOUNTER — Ambulatory Visit (HOSPITAL_COMMUNITY): Payer: 59

## 2021-12-23 ENCOUNTER — Ambulatory Visit (HOSPITAL_COMMUNITY)
Admission: EM | Admit: 2021-12-23 | Discharge: 2021-12-23 | Disposition: A | Discharge status: Home / Self Care | Payer: 59 | Attending: Family Medicine | Admitting: Family Medicine

## 2021-12-23 DIAGNOSIS — J0100 Acute maxillary sinusitis, unspecified: Secondary | ICD-10-CM | POA: Diagnosis not present

## 2021-12-23 MED ORDER — AMOXICILLIN 875 MG PO TABS
875.0000 mg | ORAL_TABLET | Freq: Two times a day (BID) | ORAL | 0 refills | Status: AC
  Filled 2021-12-23: qty 14, 7d supply, fill #0

## 2021-12-23 NOTE — ED Triage Notes (Signed)
Pt presents today with right ear pain, nasal congestion, and chest congestion that's been going on for a week.  ?

## 2021-12-23 NOTE — Discharge Instructions (Addendum)
Take amoxicillin 875 mg--1 tab twice daily for 7 days  ? ?You can continue to use the inhaler as needed. ? ?Tylenol 500 mg--2 every 6 hours as needed for pain or fever ? ? ?

## 2021-12-23 NOTE — ED Provider Notes (Signed)
?[facility] ? ? ? ?CSN: [id number] ?Arrival date & time: 12/23/21  1817 ? ? ?  ? ?History   ?Chief Complaint ?Chief Complaint  ?Patient presents with  ? Otalgia  ? Nasal Congestion  ? ? ?HPI ?Rebecca Marquez is a 61 y.o. female.  ? ? ?Otalgia ?Here for 8 day h/o cough/congestion. Also had some vomiting and throat pain the first day, but that went away. Has also had some ear pain for a few weeks on the right. ?No wheezing, though did feel short of breath a couple of times, and inhaler helped ? ?Past Medical History:  ?Diagnosis Date  ? Hypertension Dx March 2015  ? Kidney disease   ? Renal disorder   ? R/T HTN  ? Seasonal allergies   ? ? ?Patient Active Problem List  ? Diagnosis Date Noted  ? Lumbar degenerative disc disease 10/07/2019  ? 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  ? Transient vision disturbance of both eyes 09/25/2016  ? Poor dentition 02/06/2016  ? Glossitis 04/29/2015  ? HTN (hypertension) 12/11/2013  ? HLD (hyperlipidemia) 11/16/2013  ? CKD (chronic kidney disease) stage 4, GFR 15-29 ml/min (HCC) 11/16/2013  ? ? ?Past Surgical History:  ?Procedure Laterality Date  ? TUBAL LIGATION    ? ? ?OB History   ?No obstetric history on file. ?  ? ? ? ?Home Medications   ? ?Prior to Admission medications   ?Medication Sig Start Date End Date Taking? Authorizing Provider  ?amoxicillin (AMOXIL) 875 MG tablet Take 1 tablet (875 mg total) by mouth 2 (two) times daily for 7 days. 12/23/21 12/30/21 Yes Barrett Henle, MD  ?amLODipine (NORVASC) 5 MG tablet Take 1 tablet by mouth daily. 09/15/21   Claudia Desanctis, MD  ?cloNIDine (CATAPRES) 0.1 MG tablet TAKE 1 TABLET (0.1 MG TOTAL) BY MOUTH 3 (THREE) TIMES DAILY. 10/02/21 10/02/22  Charlott Rakes, MD  ?cetirizine (ZYRTEC) 5 MG tablet Take 1 tablet (5 mg total) by mouth daily. 06/27/18 02/27/19  Loura Halt A, NP  ?fluticasone (FLONASE) 50 MCG/ACT nasal spray Place 1 spray into both nostrils daily. 10/21/18 02/27/19  Raylene Everts, MD  ? ? ?Family History ?Family History   ?Problem Relation Age of Onset  ? Diabetes Mother   ? Arthritis Mother   ? Multiple sclerosis Daughter 48  ? Colon cancer Neg Hx   ? Colon polyps Neg Hx   ? Esophageal cancer Neg Hx   ? Stomach cancer Neg Hx   ? Rectal cancer Neg Hx   ? ? ?Social History ?Social History  ? ?Tobacco Use  ? Smoking status: Former  ?  Packs/day: 0.50  ?  Years: 20.00  ?  Pack years: 10.00  ?  Types: Cigarettes  ?  Quit date: 08/31/2001  ?  Years since quitting: 20.3  ? Smokeless tobacco: Never  ?Vaping Use  ? Vaping Use: Never used  ?Substance Use Topics  ? Alcohol use: Yes  ?  Alcohol/week: 1.0 standard drink  ?  Types: 1 Cans of beer per week  ?  Comment: occasional  ? Drug use: Yes  ?  Types: Marijuana  ?  Comment: once a day  ? ? ? ?Allergies   ?Nsaids and Losartan ? ? ?Review of Systems ?Review of Systems  ?HENT:  Positive for ear pain.   ? ? ?Physical Exam ?Triage Vital Signs ?ED Triage Vitals  ?Enc Vitals Group  ?   BP 12/23/21 1853 (!) 145/87  ?   Pulse Rate 12/23/21  1853 64  ?   Resp 12/23/21 1853 18  ?   Temp 12/23/21 1853 99 ?F (37.2 ?C)  ?   Temp Source 12/23/21 1853 Oral  ?   SpO2 12/23/21 1853 95 %  ?   Weight --   ?   Height --   ?   Head Circumference --   ?   Peak Flow --   ?   Pain Score 12/23/21 1849 0  ?   Pain Loc --   ?   Pain Edu? --   ?   Excl. in [place]? --   ? ?No data found. ? ?Updated Vital Signs ?BP (!) 145/87 (BP Location: Left Arm)   Pulse 64   Temp 99 ?F (37.2 ?C) (Oral)   Resp 18   SpO2 95%  ? ?Visual Acuity ?Right Eye Distance:   ?Left Eye Distance:   ?Bilateral Distance:   ? ?Right Eye Near:   ?Left Eye Near:    ?Bilateral Near:    ? ?Physical Exam ?Vitals reviewed.  ?Constitutional:   ?   General: She is not in acute distress. ?   Appearance: She is not toxic-appearing.  ?HENT:  ?   Ears:  ?   Comments: Left TM is obscured by cerumen. Right TM is partially obscured by cerumen; what is visible is dull and pink ?   Nose: Nose normal.  ?   Mouth/Throat:  ?   Mouth: Mucous membranes are moist.  ?    Pharynx: No oropharyngeal exudate or posterior oropharyngeal erythema.  ?Eyes:  ?   Extraocular Movements: Extraocular movements intact.  ?   Conjunctiva/sclera: Conjunctivae normal.  ?   Pupils: Pupils are equal, round, and reactive to light.  ?Cardiovascular:  ?   Rate and Rhythm: Normal rate and regular rhythm.  ?   Heart sounds: No murmur heard. ?Pulmonary:  ?   Effort: Pulmonary effort is normal. No respiratory distress.  ?   Breath sounds: No stridor. No wheezing, rhonchi or rales.  ?Chest:  ?   Chest wall: No tenderness.  ?Musculoskeletal:  ?   Cervical back: Neck supple.  ?Lymphadenopathy:  ?   Cervical: No cervical adenopathy.  ?Skin: ?   Capillary Refill: Capillary refill takes less than 2 seconds.  ?   Coloration: Skin is not jaundiced or pale.  ?Neurological:  ?   General: No focal deficit present.  ?   Mental Status: She is alert and oriented to person, place, and time.  ?Psychiatric:     ?   Behavior: Behavior normal.  ? ? ? ?UC Treatments / Results  ?Labs ?(all labs ordered are listed, but only abnormal results are displayed) ?Labs Reviewed - No data to display ? ?EKG ? ? ?Radiology ?No results found. ? ?Procedures ?Procedures (including critical care time) ? ?Medications Ordered in UC ?Medications - No data to display ? ?Initial Impression / Assessment and Plan / UC Course  ?I have reviewed the triage vital signs and the nursing notes. ? ?Pertinent labs & imaging results that were available during my care of the patient were reviewed by me and considered in my medical decision making (see chart for details). ? ?  ? ?I will treat for possible sinus infection ?Final Clinical Impressions(s) / UC Diagnoses  ? ?Final diagnoses:  ?Acute maxillary sinusitis, recurrence not specified  ? ? ? ?Discharge Instructions   ? ?  ?Take amoxicillin 875 mg--1 tab twice daily for 7 days  ? ? ? ? ? ? ?  ED Prescriptions   ? ? Medication Sig Dispense Auth. Provider  ? amoxicillin (AMOXIL) 875 MG tablet Take 1 tablet (875 mg  total) by mouth 2 (two) times daily for 7 days. 14 tablet Haniel Fix, Gwenlyn Perking, MD  ? ?  ? ?PDMP not reviewed this encounter. ?  ?Barrett Henle, MD ?12/23/21 1950 ? ?

## 2021-12-24 ENCOUNTER — Other Ambulatory Visit: Payer: Self-pay

## 2022-01-13 ENCOUNTER — Other Ambulatory Visit: Payer: Self-pay

## 2022-02-06 ENCOUNTER — Other Ambulatory Visit: Payer: Self-pay

## 2022-02-06 MED ORDER — AMOXICILLIN 500 MG PO CAPS
500.0000 mg | ORAL_CAPSULE | Freq: Three times a day (TID) | ORAL | 0 refills | Status: AC
  Filled 2022-02-06: qty 21, 7d supply, fill #0

## 2022-02-16 ENCOUNTER — Other Ambulatory Visit: Payer: Self-pay

## 2022-03-16 ENCOUNTER — Other Ambulatory Visit: Payer: Self-pay

## 2022-04-06 ENCOUNTER — Other Ambulatory Visit: Payer: Self-pay

## 2022-04-06 ENCOUNTER — Ambulatory Visit (HOSPITAL_COMMUNITY)
Admission: RE | Admit: 2022-04-06 | Discharge: 2022-04-06 | Disposition: A | Discharge status: Home / Self Care | Payer: Self-pay | Source: Ambulatory Visit | Attending: Physician Assistant | Admitting: Physician Assistant

## 2022-04-06 ENCOUNTER — Ambulatory Visit (INDEPENDENT_AMBULATORY_CARE_PROVIDER_SITE_OTHER): Payer: Self-pay

## 2022-04-06 ENCOUNTER — Encounter (HOSPITAL_COMMUNITY): Payer: Self-pay

## 2022-04-06 VITALS — BP 142/90 | HR 89 | Temp 98.9°F | Resp 18

## 2022-04-06 DIAGNOSIS — M79672 Pain in left foot: Secondary | ICD-10-CM

## 2022-04-06 DIAGNOSIS — N764 Abscess of vulva: Secondary | ICD-10-CM

## 2022-04-06 MED ORDER — CEFTRIAXONE SODIUM 1 G IJ SOLR
1.0000 g | Freq: Once | INTRAMUSCULAR | Status: AC
  Administered 2022-04-06: 1 g via INTRAMUSCULAR

## 2022-04-06 MED ORDER — SULFAMETHOXAZOLE-TRIMETHOPRIM 400-80 MG PO TABS
1.0000 | ORAL_TABLET | Freq: Two times a day (BID) | ORAL | 0 refills | Status: DC
  Filled 2022-04-06: qty 14, 7d supply, fill #0

## 2022-04-06 MED ORDER — LIDOCAINE HCL (PF) 1 % IJ SOLN
INTRAMUSCULAR | Status: AC
  Filled 2022-04-06: qty 2

## 2022-04-06 MED ORDER — CEFTRIAXONE SODIUM 1 G IJ SOLR
INTRAMUSCULAR | Status: AC
  Filled 2022-04-06: qty 10

## 2022-04-06 NOTE — ED Triage Notes (Signed)
PT reports she has foot pain because she stands a lot at work. Pt also reports having a bump on vaginal area. Marland Kitchen

## 2022-04-06 NOTE — Discharge Instructions (Addendum)
Take antibiotics twice daily for 7 days.  Follow-up with OB/GYN; call to schedule an appointment.  Continue with warm compresses and use Tylenol ibuprofen for pain.  If you have any worsening symptoms you need to be seen immediately.  Your x-ray was normal.  Make sure that you are wearing supportive footwear and insoles.  Use Tylenol for pain.  If your symptoms persist please follow-up with podiatry; call to schedule an appointment.  If anything worsens please return for reevaluation.

## 2022-04-06 NOTE — ED Provider Notes (Addendum)
[facility]    CSN: [id number] Arrival date & time: 04/06/22  1347      History   Chief Complaint Chief Complaint  Patient presents with   Foot Pain    I have a painful swelling on my vagina. I'm also having problems with my foot - Entered by patient   vaginal proplem    HPI Rebecca Marquez is a 61 y.o. female.   Patient presents today with several concerns.  Her primary concern today is a large abscess on her right labia majora.  Reports this has been there for several days and has gradually been enlarging in size and becoming more painful.  She denies history of recurrent skin infections.  Denies history of MRSA.  Denies any recent antibiotics.  She has not been taking any over-the-counter medication for symptom management.  Denies any fever, nausea, vomiting.  Denies history of HIV, diabetes, immunosuppression.  Pain is rated 10 on a 0-10 pain scale, described as throbbing, no aggravating or alleviating factors identified.  In addition, she reports a weeklong history of intermittent pain in her left MTP joint.  She denies any known injury or increase in activity but does have a physically demanding job that requires her to be on her feet for several hours per day.  She denies any change in her footwear or changes to insoles.  She denies history of gout or rheumatoid arthritis but does have osteoarthritis.  Pain is minimal at rest but increases with prolonged ambulation.    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      Home Medications    Prior to Admission medications   Medication Sig Start Date End Date Taking? Authorizing Provider  sulfamethoxazole-trimethoprim (BACTRIM) 400-80 MG tablet Take 1 tablet by mouth 2 (two) times daily. 04/06/22  Yes Chloeann Alfred K, PA-C  amLODipine (NORVASC) 5 MG tablet Take 1 tablet by mouth daily. 09/15/21   Claudia Desanctis, MD  cloNIDine (CATAPRES) 0.1 MG tablet TAKE 1 TABLET (0.1 MG TOTAL) BY MOUTH 3 (THREE) TIMES DAILY. 10/02/21 10/02/22  Charlott Rakes, MD  cetirizine (ZYRTEC) 5 MG tablet Take 1 tablet (5 mg total) by mouth daily. 06/27/18 02/27/19  Loura Halt A, NP  fluticasone (FLONASE) 50 MCG/ACT nasal spray Place 1 spray into both nostrils daily. 10/21/18 02/27/19  Raylene Everts, MD    Family History Family History  Problem Relation Age of Onset   Diabetes Mother    Arthritis Mother    Multiple sclerosis Daughter 31   Colon cancer Neg Hx    Colon polyps Neg Hx    Esophageal cancer Neg Hx    Stomach cancer Neg Hx    Rectal cancer Neg Hx     Social History Social History   Tobacco Use   Smoking status: Former  Packs/day: 0.50    Years: 20.00    Total pack years: 10.00    Types: Cigarettes    Quit date: 08/31/2001    Years since quitting: 20.6   Smokeless tobacco: Never  Vaping Use   Vaping Use: Never used  Substance Use Topics   Alcohol use: Yes    Alcohol/week: 1.0 standard drink of alcohol    Types: 1 Cans of beer per week    Comment: occasional   Drug use: Yes    Types: Marijuana    Comment: once a day     Allergies   Nsaids and Losartan   Review of Systems Review of Systems  Constitutional:  Positive for activity change. Negative for appetite change, fatigue and fever.  Gastrointestinal:  Negative for abdominal pain, diarrhea, nausea and vomiting.  Genitourinary:  Positive for vaginal  pain. Negative for pelvic pain, vaginal bleeding and vaginal discharge.  Musculoskeletal:  Positive for arthralgias. Negative for gait problem, joint swelling and myalgias.  Skin:  Positive for wound. Negative for color change.     Physical Exam Triage Vital Signs ED Triage Vitals  Enc Vitals Group     BP 04/06/22 1405 (!) 142/90     Pulse Rate 04/06/22 1405 89     Resp 04/06/22 1405 18     Temp 04/06/22 1405 98.9 F (37.2 C)     Temp src --      SpO2 04/06/22 1405 99 %     Weight --      Height --      Head Circumference --      Peak Flow --      Pain Score 04/06/22 1403 10     Pain Loc --      Pain Edu? --      Excl. in [place]? --    No data found.  Updated Vital Signs BP (!) 142/90   Pulse 89   Temp 98.9 F (37.2 C)   Resp 18   SpO2 99%   Visual Acuity Right Eye Distance:   Left Eye Distance:   Bilateral Distance:    Right Eye Near:   Left Eye Near:    Bilateral Near:     Physical Exam Vitals reviewed.  Constitutional:      General: She is awake. She is not in acute distress.    Appearance: Normal appearance. She is well-developed. She is not ill-appearing.     Comments: Very pleasant female appears stated age in no acute distress sitting comfortably in exam room  HENT:     Head: Normocephalic and atraumatic.  Cardiovascular:     Rate and Rhythm: Normal rate and regular rhythm.     Heart sounds: Normal heart sounds, S1 normal and S2 normal. No murmur heard. Pulmonary:     Effort: Pulmonary effort is normal.     Breath sounds: Normal breath sounds. No wheezing, rhonchi or rales.     Comments: Clear to auscultation bilaterally Abdominal:     General: Bowel sounds are normal.     Palpations: Abdomen is soft.     Tenderness: There is no abdominal tenderness. There is no right CVA tenderness, left CVA tenderness, guarding or rebound.     Comments: Benign abdominal exam  Genitourinary:    Labia:        Right: Lesion present. No rash or tenderness.         Left: No rash, tenderness or lesion.        Comments: 4  cm x 3 cm abscess noted right labia majora with central fluctuance.  No active bleeding or drainage noted. Musculoskeletal:     Left foot: Normal range of motion. Bunion present. No deformity.  Feet:     Left foot:     Protective Sensation: 10 sites tested.  10 sites sensed.     Skin integrity: No ulcer, blister or skin breakdown.     Comments: Foot neurovascularly intact.  Mild tenderness palpation over left MTP joint.  No erythema or swelling noted. Psychiatric:        Behavior: Behavior is cooperative.      UC Treatments / Results  Labs (all labs ordered are listed, but only abnormal results are displayed) Labs Reviewed - No data to display  EKG   Radiology DG Foot Complete Left  Result Date: 04/06/2022 CLINICAL DATA:  PT reports she has foot pain because she stands a lot at work. EXAM: LEFT FOOT - COMPLETE 3+ VIEW COMPARISON:  None Available. FINDINGS: There is no evidence of fracture or dislocation. Hallux valgus deformity. There is no evidence of arthropathy or other focal bone abnormality. Soft tissues are unremarkable. IMPRESSION: No acute finding in the left foot. Electronically Signed   By: Audie Pinto M.D.   On: 04/06/2022 16:02    Procedures Incision and Drainage  Date/Time: 04/06/2022 4:07 PM  Performed by: Terrilee Croak, PA-C Authorized by: Terrilee Croak, PA-C   Consent:    Consent obtained:  Verbal   Consent given by:  Patient   Risks discussed:  Incomplete drainage, infection and pain   Alternatives discussed:  Referral, observation and alternative treatment Universal protocol:    Procedure explained and questions answered to patient or proxy's satisfaction: yes     Patient identity confirmed:  Verbally with patient Location:    Type:  Abscess   Size:  6 cm x 3 cm   Location:  Anogenital   Anogenital location:  [place] Pre-procedure details:    Skin preparation:  Chlorhexidine with  alcohol Sedation:    Sedation type:  None Anesthesia:    Anesthesia method:  Local infiltration   Local anesthetic:  Lidocaine 1% WITH epi Procedure type:    Complexity:  Complex Procedure details:    Ultrasound guidance: no     Needle aspiration: no     Incision types:  Single straight   Incision depth:  Dermal   Wound management:  Probed and deloculated and irrigated with saline   Drainage:  Bloody and purulent   Drainage amount:  Moderate   Wound treatment:  Wound left open   Packing materials:  None Post-procedure details:    Procedure completion:  Tolerated with difficulty  (including critical care time)  Medications Ordered in UC Medications  cefTRIAXone (ROCEPHIN) injection 1 g (1 g Intramuscular Given 04/06/22 1604)    Initial Impression / Assessment and Plan / UC Course  I have reviewed the triage vital signs and the nursing notes.  Pertinent labs & imaging results that were available during my care of the patient were reviewed by me and considered in my medical decision making (see chart for details).     Abscess was drained in clinic today.  See procedure note above.  She was given 1 g of Rocephin in clinic today.  We will start Bactrim single strength twice daily for 7 days.  Patient has a history of chronic kidney disease with last CMP obtained February 2023 with creatinine of 2.06 and calculated creatinine clearance of  21.74 mL/min.  Discussed that she should use warm compresses and keep area clean with soap and water.  Encouraged her to follow-up with OB/GYN and was given contact information for local provider with instruction to call to schedule an appointment.  She can use Tylenol ibuprofen for pain relief.  Discussed that if she has any worsening symptoms including enlarging area, increased drainage, fever, nausea, vomiting she needs to be seen immediately to which she expressed understanding.  X-ray obtained of the foot showed no acute osseous abnormality.  She  does have a bunion on exam discussed this could be contributing to her symptoms.  Recommended that she wear supportive footwear and insoles.  She can use Tylenol for pain.  Recommended that she keep her feet elevated is much as possible.  If her symptoms persist she is to follow-up with podiatry and was given contact information for local provider with instruction to call to schedule an appointment.  Discussed that if she has any worsening symptoms she needs to be seen immediately.  Strict return precautions given.  Work excuse note provided.  Final Clinical Impressions(s) / UC Diagnoses   Final diagnoses:  Vulvar abscess  Left foot pain     Discharge Instructions      Take antibiotics twice daily for 7 days.  Follow-up with OB/GYN; call to schedule an appointment.  Continue with warm compresses and use Tylenol ibuprofen for pain.  If you have any worsening symptoms you need to be seen immediately.  Your x-ray was normal.  Make sure that you are wearing supportive footwear and insoles.  Use Tylenol for pain.  If your symptoms persist please follow-up with podiatry; call to schedule an appointment.  If anything worsens please return for reevaluation.       ED Prescriptions     Medication Sig Dispense Auth. Provider   sulfamethoxazole-trimethoprim (BACTRIM) 400-80 MG tablet Take 1 tablet by mouth 2 (two) times daily. 14 tablet Elverta Dimiceli, Derry Skill, PA-C      PDMP not reviewed this encounter.   Terrilee Croak, PA-C 04/06/22 1608    Oluwasemilore Bahl, Derry Skill, PA-C 04/06/22 1609

## 2022-04-11 ENCOUNTER — Other Ambulatory Visit: Payer: Self-pay | Admitting: Family Medicine

## 2022-04-11 DIAGNOSIS — N183 Chronic kidney disease, stage 3 unspecified: Secondary | ICD-10-CM

## 2022-04-13 ENCOUNTER — Other Ambulatory Visit: Payer: Self-pay

## 2022-04-13 MED ORDER — CLONIDINE HCL 0.1 MG PO TABS
ORAL_TABLET | ORAL | 1 refills | Status: DC
  Filled 2022-04-13: qty 90, 30d supply, fill #0
  Filled 2022-05-20: qty 90, 30d supply, fill #1
  Filled 2022-06-21: qty 90, 30d supply, fill #2
  Filled 2022-07-20: qty 90, 30d supply, fill #3

## 2022-04-13 NOTE — Telephone Encounter (Signed)
Requested Prescriptions  Pending Prescriptions Disp Refills  . cloNIDine (CATAPRES) 0.1 MG tablet 270 tablet 1    Sig: TAKE 1 TABLET (0.1 MG TOTAL) BY MOUTH 3 (THREE) TIMES DAILY.     Cardiovascular:  Alpha-2 Agonists Failed - 04/11/2022  3:08 PM      Failed - Last BP in normal range    BP Readings from Last 1 Encounters:  04/06/22 (!) 142/90         Passed - Last Heart Rate in normal range    Pulse Readings from Last 1 Encounters:  04/06/22 89         Passed - Valid encounter within last 6 months    Recent Outpatient Visits          5 months ago Benign hypertension with chronic kidney disease, stage IV ([place])   [place], Charlane Ferretti, MD   11 months ago Annual physical exam   [place], [place], MD   1 year ago CKD stage G3b/A1, GFR 30-44 and albumin creatinine ratio <30 mg/g [facility])   [place] Charlott Rakes, MD   1 year ago Pain, dental   [place], [place], MD   1 year ago Renovascular hypertension   [place], MD      Future Appointments            In 2 weeks Charlott Rakes, MD [place]

## 2022-04-17 ENCOUNTER — Other Ambulatory Visit: Payer: Self-pay

## 2022-04-28 ENCOUNTER — Encounter: Payer: Self-pay | Admitting: Family Medicine

## 2022-04-28 ENCOUNTER — Ambulatory Visit: Payer: Commercial Managed Care - HMO | Attending: Family Medicine | Admitting: Family Medicine

## 2022-04-28 VITALS — BP 156/98 | HR 76 | Ht 63.0 in | Wt 99.0 lb

## 2022-04-28 DIAGNOSIS — M2012 Hallux valgus (acquired), left foot: Secondary | ICD-10-CM | POA: Diagnosis not present

## 2022-04-28 DIAGNOSIS — N184 Chronic kidney disease, stage 4 (severe): Secondary | ICD-10-CM | POA: Diagnosis not present

## 2022-04-28 DIAGNOSIS — I129 Hypertensive chronic kidney disease with stage 1 through stage 4 chronic kidney disease, or unspecified chronic kidney disease: Secondary | ICD-10-CM | POA: Diagnosis not present

## 2022-04-28 NOTE — Progress Notes (Signed)
Subjective:  Patient ID: Rebecca Marquez, female    DOB: 04-19-1961  Age: 61 y.o. MRN: [id number]  CC: Hypertension   HPI Rebecca Marquez is a 61 y.o. year old female with a history of hypertension, stage IV chronic kidney disease (followed by Nephrology)  Interval History: She noticed the tip of her nails curving down ward over the last few months and she has no pain or change in coloration. Her left foot hurts on the medial aspect of her great toe and it radiates along the medial border of her left foot. She works at the [employer] and is on her feet a lot which causes a lot of pain.  Her manager requested she obtained a note from work to take her out for the next couple of days until Monday, 05/04/2022. Foot x-ray from 04/06/2022 revealed no acute finding, presence of hallux valgus deformity.   Her BP is elevated and she states she just took her antihypertensive prior to coming here. Past Medical History:  Diagnosis Date   Hypertension Dx March 2015   Kidney disease    Renal disorder    R/T HTN   Seasonal allergies     Past Surgical History:  Procedure Laterality Date   TUBAL LIGATION      Family History  Problem Relation Age of Onset   Diabetes Mother    Arthritis Mother    Multiple sclerosis Daughter 7   Colon cancer Neg Hx    Colon polyps Neg Hx    Esophageal cancer Neg Hx    Stomach cancer Neg Hx    Rectal cancer Neg Hx     Social History   Socioeconomic History   Marital status: Legally Separated    Spouse name: Not on file   Number of children: Not on file   Years of education: Not on file   Highest education level: Not on file  Occupational History   Not on file  Tobacco Use   Smoking status: Former    Packs/day: 0.50    Years: 20.00    Total pack years: 10.00    Types: Cigarettes    Quit date: 08/31/2001    Years since quitting: 20.6   Smokeless tobacco: Never  Vaping Use   Vaping Use: Never used  Substance and Sexual Activity   Alcohol use:  Yes    Alcohol/week: 1.0 standard drink of alcohol    Types: 1 Cans of beer per week    Comment: occasional   Drug use: Yes    Types: Marijuana    Comment: once a day   Sexual activity: Not on file  Other Topics Concern   Not on file  Social History Narrative   Not on file   Social Determinants of Health   Financial Resource Strain: Not on file  Food Insecurity: Not on file  Transportation Needs: Not on file  Physical Activity: Not on file  Stress: Not on file  Social Connections: Not on file    Allergies  Allergen Reactions   Nsaids Other (See Comments)    ckd   Losartan Tinitus    Outpatient Medications Prior to Visit  Medication Sig Dispense Refill   amLODipine (NORVASC) 5 MG tablet Take 1 tablet by mouth daily. 30 tablet 11   cloNIDine (CATAPRES) 0.1 MG tablet TAKE 1 TABLET (0.1 MG TOTAL) BY MOUTH 3 (THREE) TIMES DAILY. 270 tablet 1   sulfamethoxazole-trimethoprim (BACTRIM) 400-80 MG tablet Take 1 tablet by mouth 2 (two) times  daily. (Patient not taking: Reported on 04/28/2022) 14 tablet 0   No facility-administered medications prior to visit.     ROS Review of Systems  Constitutional:  Negative for activity change and appetite change.  HENT:  Negative for sinus pressure and sore throat.   Respiratory:  Negative for chest tightness, shortness of breath and wheezing.   Cardiovascular:  Negative for chest pain and palpitations.  Gastrointestinal:  Negative for abdominal distention, abdominal pain and constipation.  Genitourinary: Negative.   Musculoskeletal:        See HPI  Skin:        No nail abnormalities noted  Psychiatric/Behavioral:  Negative for behavioral problems and dysphoric mood.     Objective:  BP (!) 156/98   Pulse 76   Ht '5\' 3"'$  (1.6 m)   Wt 99 lb (44.9 kg)   SpO2 94%   BMI 17.54 kg/m      04/28/2022    8:40 AM 04/06/2022    2:05 PM 12/23/2021    6:53 PM  BP/Weight  Systolic BP 834 196 222  Diastolic BP 98 90 87  Wt. (Lbs) 99    BMI  17.54 kg/m2        Physical Exam Constitutional:      Appearance: She is well-developed.  Cardiovascular:     Rate and Rhythm: Normal rate.     Heart sounds: Normal heart sounds. No murmur heard. Pulmonary:     Effort: Pulmonary effort is normal.     Breath sounds: Normal breath sounds. No wheezing or rales.  Chest:     Chest wall: No tenderness.  Abdominal:     General: Bowel sounds are normal. There is no distension.     Palpations: Abdomen is soft. There is no mass.     Tenderness: There is no abdominal tenderness.  Musculoskeletal:        General: Normal range of motion.     Right lower leg: No edema.     Left lower leg: No edema.     Comments: Valgus deformity of left big toe with associated bunion  Neurological:     Mental Status: She is alert and oriented to person, place, and time.  Psychiatric:        Mood and Affect: Mood normal.        Latest Ref Rng & Units 10/28/2021    9:40 AM 04/24/2021   11:33 AM 11/14/2020    9:06 AM  CMP  Glucose 70 - 99 mg/dL 72  83  93   BUN 6 - 24 mg/dL 36  29  28   Creatinine 0.57 - 1.00 mg/dL 2.06  2.08  2.11   Sodium 134 - 144 mmol/L 139  139  139   Potassium 3.5 - 5.2 mmol/L 4.2  3.9  4.4   Chloride 96 - 106 mmol/L 105  102  104   CO2 20 - 29 mmol/L '20  21  18   '$ Calcium 8.7 - 10.2 mg/dL 9.6  9.4  9.4   Total Protein 6.0 - 8.5 g/dL 6.9   7.6   Total Bilirubin 0.0 - 1.2 mg/dL 0.3   0.2   Alkaline Phos 44 - 121 IU/L 128   134   AST 0 - 40 IU/L 26   29   ALT 0 - 32 IU/L 17   21     Lipid Panel     Component Value Date/Time   CHOL 206 (H) 01/03/2020 1055   TRIG 120  01/03/2020 1055   HDL 64 01/03/2020 1055   CHOLHDL 3.2 01/03/2020 1055   CHOLHDL 4.0 09/25/2016 0949   VLDL 17 09/25/2016 0949   LDLCALC 121 (H) 01/03/2020 1055    CBC    Component Value Date/Time   WBC 4.0 05/08/2020 1000   WBC 5.0 01/19/2017 2118   RBC 4.38 05/08/2020 1000   RBC 4.41 01/19/2017 2118   HGB 15.0 05/08/2020 1000   HCT 43.3 05/08/2020  1000   PLT 222 05/08/2020 1000   MCV 99 (H) 05/08/2020 1000   MCH 34.2 (H) 05/08/2020 1000   MCH 31.5 01/19/2017 2118   MCHC 34.6 05/08/2020 1000   MCHC 33.4 01/19/2017 2118   RDW 14.0 05/08/2020 1000   LYMPHSABS 2.0 01/19/2017 2118   MONOABS 0.3 01/19/2017 2118   EOSABS 0.3 01/19/2017 2118   BASOSABS 0.1 01/19/2017 2118    Lab Results  Component Value Date   HGBA1C 5.6 01/03/2020    Assessment & Plan:  1. Hallux valgus of left foot With pain worse on prolonged standing I do not think she needs to be out of the office for prolonged period of time and have informed her I will not be completing any short-term disability paperwork is sent to me by her human resources office She states her manager is trying to 'cover her' hence the request to be out of work and so I have provided an office note to keep her out for the rest of the week. - Ambulatory referral to Podiatry  2. Benign hypertension with chronic kidney disease, stage IV (HCC) Uncontrolled She just took her antihypertensive hence I will make no regimen change today Advised to take antihypertensive prior to her coming into the office for better evaluation of her blood pressure Counseled on blood pressure goal of less than 130/80, low-sodium, DASH diet, medication compliance, 150 minutes of moderate intensity exercise per week. Discussed medication compliance, adverse effects. - LP+Non-HDL Cholesterol - Basic Metabolic Panel   Health Care Maintenance: Up-to-date on mammogram, colonoscopy and Pap smear No orders of the defined types were placed in this encounter.   Follow-up: Return in about 3 months (around 07/29/2022).       Charlott Rakes, MD, FAAFP. [facility] and [place], [place]   04/28/2022, 1:01 PM

## 2022-04-28 NOTE — Patient Instructions (Signed)

## 2022-04-29 ENCOUNTER — Other Ambulatory Visit: Payer: Self-pay

## 2022-04-29 ENCOUNTER — Other Ambulatory Visit: Payer: Self-pay | Admitting: Family Medicine

## 2022-04-29 DIAGNOSIS — E782 Mixed hyperlipidemia: Secondary | ICD-10-CM

## 2022-04-29 LAB — BASIC METABOLIC PANEL
BUN/Creatinine Ratio: 15 (ref 9–23)
BUN: 37 mg/dL — ABNORMAL HIGH (ref 6–24)
CO2: 19 mmol/L — ABNORMAL LOW (ref 20–29)
Calcium: 9.5 mg/dL (ref 8.7–10.2)
Chloride: 103 mmol/L (ref 96–106)
Creatinine, Ser: 2.46 mg/dL — ABNORMAL HIGH (ref 0.57–1.00)
Glucose: 87 mg/dL (ref 70–99)
Potassium: 4.5 mmol/L (ref 3.5–5.2)
Sodium: 140 mmol/L (ref 134–144)
eGFR: 22 mL/min/{1.73_m2} — ABNORMAL LOW (ref >59)

## 2022-04-29 LAB — LP+NON-HDL CHOLESTEROL
Cholesterol, Total: 223 mg/dL — ABNORMAL HIGH (ref 100–199)
HDL: 75 mg/dL (ref >39)
LDL Chol Calc (NIH): 129 mg/dL — ABNORMAL HIGH (ref 0–99)
Total Non-HDL-Chol (LDL+VLDL): 148 mg/dL — ABNORMAL HIGH (ref 0–129)
Triglycerides: 109 mg/dL (ref 0–149)
VLDL Cholesterol Cal: 19 mg/dL (ref 5–40)

## 2022-04-29 MED ORDER — ATORVASTATIN CALCIUM 20 MG PO TABS
20.0000 mg | ORAL_TABLET | Freq: Every day | ORAL | 3 refills | Status: DC
Start: 2022-04-29 — End: 2022-11-05
  Filled 2022-04-29: qty 30, 30d supply, fill #0
  Filled 2022-05-31: qty 30, 30d supply, fill #1
  Filled 2022-07-02: qty 30, 30d supply, fill #2
  Filled 2022-08-14 – 2022-08-21 (×2): qty 30, 30d supply, fill #3

## 2022-04-30 ENCOUNTER — Other Ambulatory Visit: Payer: Self-pay

## 2022-05-11 ENCOUNTER — Ambulatory Visit (INDEPENDENT_AMBULATORY_CARE_PROVIDER_SITE_OTHER): Payer: Commercial Managed Care - HMO

## 2022-05-11 ENCOUNTER — Ambulatory Visit: Payer: Commercial Managed Care - HMO | Admitting: Podiatry

## 2022-05-11 DIAGNOSIS — M2012 Hallux valgus (acquired), left foot: Secondary | ICD-10-CM

## 2022-05-11 NOTE — Progress Notes (Signed)
   Chief Complaint  Patient presents with   Bunions    Patient is here for left foot pain.the patient stands for long periods on her feet.    Subjective: 61 y.o. female presents today as a new patient for evaluation of a mildly symptomatic bunion to the left foot.  Patient states that there are certain shoes that irritate her foot.  She works on her feet all day long.  She does have a pair of new balance shoes that she states do not elicit any pain or tenderness to the bunion site.  Patient is not interested in surgery.  Past Medical History:  Diagnosis Date   Hypertension Dx March 2015   Kidney disease    Renal disorder    R/T HTN   Seasonal allergies     Past Surgical History:  Procedure Laterality Date   TUBAL LIGATION      Allergies  Allergen Reactions   Nsaids Other (See Comments)    ckd   Losartan Tinitus     Objective: Physical Exam General: The patient is alert and oriented x3 in no acute distress.  Dermatology: Skin is cool, dry and supple bilateral lower extremities. Negative for open lesions or macerations.  Vascular: Palpable pedal pulses bilaterally. No edema or erythema noted. Capillary refill within normal limits.  Neurological: Epicritic and protective threshold grossly intact bilaterally.   Musculoskeletal Exam: Clinical evidence of bunion deformity noted to the respective foot. There is moderate pain on palpation range of motion of the first MPJ. Lateral deviation of the hallux noted consistent with hallux abductovalgus.  Radiographic Exam: Normal osseous mineralization.  No acute fractures identified.  Increased intermetatarsal angle greater than 15 with a hallux abductus angle greater than 30 noted on AP view.   Assessment: 1.  Hallux valgus left   Plan of Care:  1. Patient was evaluated. X-Rays reviewed. 2.  Today we discussed different treatment options both conservative and surgical.  The patient is not interested in surgical intervention at  this time. 3.  Continue wearing wide fitting shoes that do not constrict the toebox area or irritate the bunion 4.  Return to clinic as needed   Edrick Kins, DPM [facility]  Dr. Edrick Kins, DPM    [address], [place] [address]                Office [phone]  Fax [phone]

## 2022-05-20 ENCOUNTER — Other Ambulatory Visit: Payer: Self-pay

## 2022-06-01 ENCOUNTER — Other Ambulatory Visit: Payer: Self-pay

## 2022-06-11 ENCOUNTER — Ambulatory Visit (HOSPITAL_COMMUNITY): Payer: Commercial Managed Care - HMO

## 2022-06-22 ENCOUNTER — Other Ambulatory Visit: Payer: Self-pay

## 2022-07-02 ENCOUNTER — Other Ambulatory Visit: Payer: Self-pay

## 2022-07-03 ENCOUNTER — Encounter (HOSPITAL_COMMUNITY): Payer: Self-pay

## 2022-07-03 ENCOUNTER — Other Ambulatory Visit: Payer: Self-pay

## 2022-07-03 ENCOUNTER — Ambulatory Visit (HOSPITAL_COMMUNITY)
Admission: EM | Admit: 2022-07-03 | Discharge: 2022-07-03 | Disposition: A | Discharge status: Home / Self Care | Payer: Commercial Managed Care - HMO | Attending: Physician Assistant | Admitting: Physician Assistant

## 2022-07-03 DIAGNOSIS — K529 Noninfective gastroenteritis and colitis, unspecified: Secondary | ICD-10-CM | POA: Diagnosis not present

## 2022-07-03 MED ORDER — ONDANSETRON 4 MG PO TBDP
4.0000 mg | ORAL_TABLET | Freq: Three times a day (TID) | ORAL | 0 refills | Status: AC | PRN
  Filled 2022-07-03: qty 20, 7d supply, fill #0

## 2022-07-03 NOTE — ED Provider Notes (Signed)
[place]    CSN: [id number] Arrival date & time: 07/03/22  0801      History   Chief Complaint Chief Complaint  Patient presents with   Abdominal Pain   Emesis    HPI Rebecca Marquez is a 61 y.o. female.   Patient here today for evaluation of generalized abdominal pain and vomiting that started earlier this morning.  She reports that she has had 3 episodes of vomiting.  She has not had any diarrhea.  She reports that she has had more epigastric pain than other abdominal pain.  She denies any blood in her stool or dark tarry stools.  She has not had any blood in her emesis or dark vomit.  She denies any fever, congestion or cough.  She does not report treatment for symptoms.  The history is provided by the patient.  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      Home Medications    Prior to Admission medications   Medication Sig Start Date End Date Taking? Authorizing Provider  ondansetron (ZOFRAN-ODT) 4 MG disintegrating tablet Take 1 tablet (4 mg total) by mouth every 8 (eight) hours as needed for  nausea or vomiting. 07/03/22  Yes Francene Finders, PA-C  amLODipine (NORVASC) 5 MG tablet Take 1 tablet by mouth daily. 09/15/21   Claudia Desanctis, MD  atorvastatin (LIPITOR) 20 MG tablet Take 1 tablet (20 mg total) by mouth daily. 04/29/22   Charlott Rakes, MD  cloNIDine (CATAPRES) 0.1 MG tablet TAKE 1 TABLET (0.1 MG TOTAL) BY MOUTH 3 (THREE) TIMES DAILY. 04/13/22 04/13/23  Charlott Rakes, MD  sulfamethoxazole-trimethoprim (BACTRIM) 400-80 MG tablet Take 1 tablet by mouth 2 (two) times daily. 04/06/22   Raspet, Derry Skill, PA-C  cetirizine (ZYRTEC) 5 MG tablet Take 1 tablet (5 mg total) by mouth daily. 06/27/18 02/27/19  Loura Halt A, NP  fluticasone (FLONASE) 50 MCG/ACT nasal spray Place 1 spray into both nostrils daily. 10/21/18 02/27/19  Raylene Everts, MD    Family History Family History  Problem Relation Age of Onset   Diabetes Mother    Arthritis Mother    Multiple sclerosis Daughter 12   Colon cancer Neg Hx    Colon polyps Neg Hx    Esophageal cancer Neg Hx    Stomach cancer Neg Hx    Rectal cancer Neg Hx     Social History Social History   Tobacco Use   Smoking status: Former    Packs/day: 0.50    Years: 20.00  Total pack years: 10.00    Types: Cigarettes    Quit date: 08/31/2001    Years since quitting: 20.8   Smokeless tobacco: Never  Vaping Use   Vaping Use: Never used  Substance Use Topics   Alcohol use: Yes    Alcohol/week: 1.0 standard drink of alcohol    Types: 1 Cans of beer per week    Comment: occasional   Drug use: Yes    Types: Marijuana    Comment: once a day     Allergies   Nsaids and Losartan   Review of Systems Review of Systems  Constitutional:  Negative for chills and fever.  HENT:  Negative for congestion and rhinorrhea.   Eyes:  Negative for discharge and redness.  Respiratory:  Negative for cough and shortness of breath.   Gastrointestinal:  Positive for abdominal pain, nausea and vomiting. Negative for abdominal distention, blood in  stool and diarrhea.     Physical Exam Triage Vital Signs ED Triage Vitals [07/03/22 0815]  Enc Vitals Group     BP      Pulse      Resp      Temp      Temp src      SpO2      Weight      Height      Head Circumference      Peak Flow      Pain Score 9     Pain Loc      Pain Edu?      Excl. in [place]?    No data found.  Updated Vital Signs BP (!) 148/109 (BP Location: Left Wrist)   Pulse 67   Temp 97.7 F (36.5 C)   Resp 16   SpO2 98%      Physical Exam Vitals and nursing note reviewed.  Constitutional:      General: She is not in acute distress.    Appearance: Normal appearance. She is not ill-appearing.  HENT:     Head: Normocephalic and atraumatic.  Eyes:     Conjunctiva/sclera: Conjunctivae normal.  Cardiovascular:     Rate and Rhythm: Normal rate and regular rhythm.  Pulmonary:     Effort: Pulmonary effort is normal. No respiratory distress.     Breath sounds: Normal breath sounds. No wheezing, rhonchi or rales.  Abdominal:     General: Abdomen is flat. Bowel sounds are normal. There is no distension.     Palpations: Abdomen is soft.     Tenderness: There is abdominal tenderness (mild TTP diffusely with somewhat worse TTP to epigastric region). There is no guarding or rebound.  Neurological:     Mental Status: She is alert.  Psychiatric:        Mood and Affect: Mood normal.        Behavior: Behavior normal.        Thought Content: Thought content normal.      UC Treatments / Results  Labs (all labs ordered are listed, but only abnormal results are displayed) Labs Reviewed - No data to display  EKG   Radiology No results found.  Procedures Procedures (including critical care time)  Medications Ordered in UC Medications - No data to display  Initial Impression / Assessment and Plan / UC Course  I have reviewed the triage vital signs and the nursing notes.  Pertinent labs & imaging results that were available during my care of the patient  were reviewed by me and considered in  my medical decision making (see chart for details).    I suspect symptoms are likely related to gastroenteritis as we have seen this in the community recently.  I did recommend if pain were to worsen or with any other concerns that patient be seen in the emergency department for stat imaging and labs.  Zofran prescribed for nausea.  Recommended increase fluids and bland diet when appetite returns.  Patient expresses understanding and has no further questions or concerns.   Final Clinical Impressions(s) / UC Diagnoses   Final diagnoses:  Gastroenteritis     Discharge Instructions      Drink plenty of fluids, try to incorporate some electrolyte replacement with gatorade, powerade, propel, etc.  Follow bland diet for the next few days.   Follow up with any concerns, report to ED with any worsening abdominal pain.      ED Prescriptions     Medication Sig Dispense Auth. Provider   ondansetron (ZOFRAN-ODT) 4 MG disintegrating tablet Take 1 tablet (4 mg total) by mouth every 8 (eight) hours as needed for nausea or vomiting. 20 tablet Francene Finders, PA-C      PDMP not reviewed this encounter.   Francene Finders, PA-C 07/03/22 [phone]

## 2022-07-03 NOTE — ED Triage Notes (Signed)
Pt is here for stomach pain and vomiting  since today

## 2022-07-03 NOTE — Discharge Instructions (Addendum)
Drink plenty of fluids, try to incorporate some electrolyte replacement with gatorade, powerade, propel, etc.  Follow bland diet for the next few days.   Follow up with any concerns, report to ED with any worsening abdominal pain.

## 2022-07-08 ENCOUNTER — Other Ambulatory Visit: Payer: Self-pay

## 2022-07-08 MED ORDER — ACETAMINOPHEN-CODEINE 300-30 MG PO TABS
1.0000 | ORAL_TABLET | Freq: Four times a day (QID) | ORAL | 0 refills | Status: DC
Start: 2022-07-08 — End: 2022-07-29
  Filled 2022-07-08: qty 12, 3d supply, fill #0

## 2022-07-08 MED ORDER — AMOXICILLIN 500 MG PO CAPS
500.0000 mg | ORAL_CAPSULE | Freq: Three times a day (TID) | ORAL | 0 refills | Status: DC
  Filled 2022-07-08: qty 21, 7d supply, fill #0

## 2022-07-16 ENCOUNTER — Ambulatory Visit
Admission: RE | Admit: 2022-07-16 | Discharge: 2022-07-16 | Disposition: A | Discharge status: Home / Self Care | Payer: Commercial Managed Care - HMO | Source: Ambulatory Visit | Attending: Urgent Care | Admitting: Urgent Care

## 2022-07-16 VITALS — BP 132/88 | HR 70 | Temp 98.0°F | Resp 12

## 2022-07-16 DIAGNOSIS — H6123 Impacted cerumen, bilateral: Secondary | ICD-10-CM

## 2022-07-16 NOTE — ED Triage Notes (Signed)
Pt presents with l;eft ear fullness x 3 days

## 2022-07-16 NOTE — ED Provider Notes (Signed)
[facility]  Note:  This document was prepared using Systems analyst and may include unintentional dictation errors.  MRN: [id number] DOB: 1961/02/03  Subjective:   Rebecca Marquez is a 61 y.o. female presenting for 3 day history of acute onset persistent left ear fullness. No fever, pain, drainage, dizziness. Has a history of issues with ear wax. Has tried over-the-counter Debrox.  No current facility-administered medications for this encounter.  Current Outpatient Medications:    acetaminophen-codeine (TYLENOL #3) 300-30 MG tablet, Take 1 tablet by mouth every 6 (six) hours., Disp: 12 tablet, Rfl: 0   amLODipine (NORVASC) 5 MG tablet, Take 1 tablet by mouth daily., Disp: 30 tablet, Rfl: 11   amoxicillin (AMOXIL) 500 MG capsule, Take 1 capsule (500 mg total) by mouth every 8 (eight) hours for 7 days., Disp: 21 capsule, Rfl: 0   atorvastatin (LIPITOR) 20 MG tablet, Take 1 tablet (20 mg total) by mouth daily., Disp: 30 tablet, Rfl: 3   cloNIDine (CATAPRES) 0.1 MG tablet, TAKE 1 TABLET (0.1 MG TOTAL) BY MOUTH 3 (THREE) TIMES DAILY., Disp: 270 tablet, Rfl: 1   ondansetron (ZOFRAN-ODT) 4 MG disintegrating tablet, Take 1 tablet (4 mg total) by mouth every 8 (eight) hours as needed for nausea or vomiting., Disp: 20 tablet, Rfl: 0   sulfamethoxazole-trimethoprim (BACTRIM) 400-80 MG tablet, Take 1 tablet by mouth 2 (two) times daily., Disp: 14 tablet, Rfl: 0   Allergies  Allergen Reactions   Nsaids Other (See Comments)    ckd   Losartan Tinitus    Past Medical History:  Diagnosis Date   Hypertension Dx March 2015   Kidney disease    Renal disorder    R/T HTN   Seasonal allergies      Past Surgical History:  Procedure Laterality Date   TUBAL LIGATION      Family History  Problem Relation Age of Onset   Diabetes Mother    Arthritis Mother    Multiple sclerosis Daughter 29   Colon cancer Neg Hx    Colon polyps Neg Hx    Esophageal  cancer Neg Hx    Stomach cancer Neg Hx    Rectal cancer Neg Hx     Social History   Tobacco Use   Smoking status: Former    Packs/day: 0.50    Years: 20.00    Total pack years: 10.00    Types: Cigarettes    Quit date: 08/31/2001    Years since quitting: 20.8   Smokeless tobacco: Never  Vaping Use   Vaping Use: Never used  Substance Use Topics   Alcohol use: Yes    Alcohol/week: 1.0 standard drink of alcohol    Types: 1 Cans of beer per week    Comment: occasional   Drug use: Yes    Types: Marijuana    Comment: once a day    ROS   Objective:   Vitals: BP 132/88 (BP Location: Right Arm)   Pulse 70   Temp 98 F (36.7 C) (Oral)   Resp 12   SpO2 96%   Physical Exam Constitutional:      General: She is not in acute distress.    Appearance: Normal appearance. She is well-developed. She is not ill-appearing, toxic-appearing or diaphoretic.  HENT:     Head: Normocephalic and atraumatic.     Right Ear: Tympanic membrane, ear canal and external ear normal. No tenderness. There is impacted cerumen. Tympanic membrane is not injected, perforated, erythematous  or bulging.     Left Ear: Tympanic membrane, ear canal and external ear normal. No tenderness. There is impacted cerumen. Tympanic membrane is not injected, perforated, erythematous or bulging.     Ears:     Comments: TMs clear following ear lavage.     Nose: Nose normal.     Mouth/Throat:     Mouth: Mucous membranes are moist.  Eyes:     General: No scleral icterus.       Right eye: No discharge.        Left eye: No discharge.     Extraocular Movements: Extraocular movements intact.  Cardiovascular:     Rate and Rhythm: Normal rate.  Pulmonary:     Effort: Pulmonary effort is normal.  Skin:    General: Skin is warm and dry.  Neurological:     General: No focal deficit present.     Mental Status: She is alert and oriented to person, place, and time.  Psychiatric:        Mood and Affect: Mood normal.         Behavior: Behavior normal.    Ear lavage performed using mixture of peroxide and water.  Pressure irrigation performed using a bottle and a thin ear tube.  Bilateral ear lavage.  No curette was used.   Assessment and Plan :   PDMP not reviewed this encounter.  1. Bilateral impacted cerumen     Successful bilateral ear lavage.  General management of cerumen impaction reviewed with patient.  Anticipatory guidance provided. Counseled patient on potential for adverse effects with medications prescribed/recommended today, ER and return-to-clinic precautions discussed, patient verbalized understanding.    Jaynee Eagles, PA-C 07/16/22 1108

## 2022-07-17 ENCOUNTER — Other Ambulatory Visit: Payer: Self-pay

## 2022-07-17 ENCOUNTER — Ambulatory Visit: Payer: Self-pay | Admitting: *Deleted

## 2022-07-17 MED ORDER — AMLODIPINE BESYLATE 10 MG PO TABS
10.0000 mg | ORAL_TABLET | Freq: Every day | ORAL | 1 refills | Status: DC
Start: 2022-07-17 — End: 2022-11-05
  Filled 2022-07-17: qty 30, 30d supply, fill #0
  Filled 2022-08-14 – 2022-08-21 (×2): qty 30, 30d supply, fill #1
  Filled 2022-09-13 – 2022-09-22 (×2): qty 30, 30d supply, fill #2
  Filled 2022-10-18: qty 30, 30d supply, fill #3

## 2022-07-17 NOTE — Addendum Note (Signed)
Addended by: Charlott Rakes on: 07/17/2022 03:43 PM   Modules accepted: Orders

## 2022-07-17 NOTE — Telephone Encounter (Signed)
VM left informing pt of medication change.

## 2022-07-17 NOTE — Telephone Encounter (Signed)
Summary: bp med ?   Patient called in states bp was going up, so she doubled up on Amlodipine '5mg'$  so, she is asking if Dr. Margarita Rana can increase the Amlodipine back up to '10mg'$  as previously. She didn't want an appt, just asking can this be done.         Reason for Disposition  [1] Caller has NON-URGENT medicine question about med that PCP prescribed AND [2] triager unable to answer question  Answer Assessment - Initial Assessment Questions 1. NAME of MEDICINE: "What medicine(s) are you calling about?"     Amlodipine- wants to start 10 mg ( she has been taking this dose for several days and checking BP at home) 2. QUESTION: "What is your question?" (e.g., double dose of medicine, side effect)     Patient has had recent dental work and she has had elevated BP- last visit 153/104- would not come down. Patient restarted 10 mg dose and BP has returned to normal range. BP- 130/80 range- unable to chack BP while on phone- does have appointment in 10 days 3. PRESCRIBER: "Who prescribed the medicine?" Reason: if prescribed by specialist, call should be referred to that group.     PCP 4. SYMPTOMS: "Do you have any symptoms?" If Yes, ask: "What symptoms are you having?"  "How bad are the symptoms (e.g., mild, moderate, severe)     No symptoms  Protocols used: Medication Question Call-A-AH

## 2022-07-17 NOTE — Telephone Encounter (Signed)
I have sent prescription for increased dose of amlodipine 10 mg to her pharmacy.

## 2022-07-17 NOTE — Telephone Encounter (Signed)
Routing to PCP for review.

## 2022-07-20 ENCOUNTER — Other Ambulatory Visit: Payer: Self-pay

## 2022-07-27 ENCOUNTER — Other Ambulatory Visit: Payer: Self-pay

## 2022-07-27 ENCOUNTER — Encounter: Payer: Self-pay | Admitting: Family Medicine

## 2022-07-27 ENCOUNTER — Ambulatory Visit: Payer: Commercial Managed Care - HMO | Attending: Family Medicine | Admitting: Family Medicine

## 2022-07-27 VITALS — BP 141/87 | HR 74 | Temp 98.0°F | Ht 63.0 in | Wt 107.6 lb

## 2022-07-27 DIAGNOSIS — I129 Hypertensive chronic kidney disease with stage 1 through stage 4 chronic kidney disease, or unspecified chronic kidney disease: Secondary | ICD-10-CM

## 2022-07-27 DIAGNOSIS — N184 Chronic kidney disease, stage 4 (severe): Secondary | ICD-10-CM

## 2022-07-27 DIAGNOSIS — M542 Cervicalgia: Secondary | ICD-10-CM | POA: Diagnosis not present

## 2022-07-27 DIAGNOSIS — E782 Mixed hyperlipidemia: Secondary | ICD-10-CM | POA: Diagnosis not present

## 2022-07-27 MED ORDER — CLONIDINE HCL 0.1 MG PO TABS
0.1000 mg | ORAL_TABLET | Freq: Three times a day (TID) | ORAL | 1 refills | Status: DC
  Filled 2022-07-27: qty 270, fill #0
  Filled 2022-08-14: qty 90, 30d supply, fill #0
  Filled 2022-08-21: qty 70, 24d supply, fill #0
  Filled 2022-08-21: qty 20, 6d supply, fill #0
  Filled 2022-08-21: qty 90, 30d supply, fill #0
  Filled 2022-09-13 – 2022-09-22 (×2): qty 90, 30d supply, fill #1
  Filled 2022-10-18: qty 90, 30d supply, fill #2

## 2022-07-27 MED ORDER — CYCLOBENZAPRINE HCL 5 MG PO TABS
5.0000 mg | ORAL_TABLET | Freq: Every day | ORAL | 1 refills | Status: DC
  Filled 2022-07-27: qty 30, 30d supply, fill #0
  Filled 2022-08-14 – 2022-10-18 (×2): qty 30, 30d supply, fill #1

## 2022-07-27 NOTE — Progress Notes (Signed)
Subjective:  Patient ID: Rebecca Marquez, female    DOB: [birth year]  Age: 61 y.o. MRN: [id number]  CC: Hypertension   HPI Rebecca Marquez is a 61 y.o. year old female with a history of  hypertension, hyperlipidemia stage IV chronic kidney disease (followed by Nephrology)   Interval History:  Her right sided neck pain which radiates from her neck to her right shoulder which has been present for a while usually a 6-7/10 and nothing provides relief. She has no numbness or tingling.  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  She is right-hand dominant. Endorses adherence with her antihypertensive and she was initiated on a statin after her last set of labs revealed hypercholesterolemia. She has not seen her Nephrologist in a while but will be scheduling a follow-up appointment. Past Medical History:  Diagnosis Date   Hypertension Dx March 2015   Kidney disease    Renal disorder    R/T HTN   Seasonal allergies     Past Surgical History:  Procedure Laterality Date   TUBAL LIGATION      Family History  Problem Relation Age of Onset   Diabetes Mother    Arthritis Mother    Multiple sclerosis Daughter 4   Colon cancer Neg Hx    Colon polyps Neg Hx    Esophageal cancer Neg Hx    Stomach cancer Neg Hx    Rectal cancer Neg Hx     Social History   Socioeconomic History   Marital status: Legally Separated    Spouse name: Not on file   Number of children: Not on file   Years of education: Not on file   Highest education level: Not on file  Occupational History   Not on file  Tobacco Use   Smoking status: Former    Packs/day: 0.50    Years: 20.00    Total pack years: 10.00    Types: Cigarettes    Quit date: 08/31/2001    Years since quitting: 20.9   Smokeless tobacco: Never  Vaping Use   Vaping Use: Never used  Substance and Sexual Activity   Alcohol use: Yes    Alcohol/week: 1.0 standard drink of alcohol    Types: 1 Cans of  beer per week    Comment: occasional   Drug use: Yes    Types: Marijuana    Comment: once a day   Sexual activity: Not on file  Other Topics Concern   Not on file  Social History Narrative   Not on file   Social Determinants of Health   Financial Resource Strain: Not on file  Food Insecurity: Not on file  Transportation Needs: Not on file  Physical Activity: Not on file  Stress: Not on file  Social Connections: Not on file    Allergies  Allergen Reactions   Nsaids Other (See Comments)    ckd   Losartan Tinitus    Outpatient Medications Prior to Visit  Medication Sig Dispense Refill   acetaminophen-codeine (TYLENOL #3) 300-30 MG tablet Take 1 tablet by mouth every 6 (six) hours. 12 tablet 0   amLODipine (NORVASC) 10 MG tablet Take 1 tablet (10 mg total) by mouth daily. 90 tablet 1   atorvastatin (LIPITOR) 20 MG tablet Take 1 tablet (20 mg total) by mouth daily. 30 tablet 3   ondansetron (ZOFRAN-ODT) 4 MG disintegrating tablet Take 1 tablet (4 mg total) by mouth every 8 (eight)  hours as needed for nausea or vomiting. 20 tablet 0   cloNIDine (CATAPRES) 0.1 MG tablet TAKE 1 TABLET (0.1 MG TOTAL) BY MOUTH 3 (THREE) TIMES DAILY. 270 tablet 1   amoxicillin (AMOXIL) 500 MG capsule Take 1 capsule (500 mg total) by mouth every 8 (eight) hours for 7 days. (Patient not taking: Reported on 07/27/2022) 21 capsule 0   sulfamethoxazole-trimethoprim (BACTRIM) 400-80 MG tablet Take 1 tablet by mouth 2 (two) times daily. (Patient not taking: Reported on 07/27/2022) 14 tablet 0   No facility-administered medications prior to visit.     ROS Review of Systems  Constitutional:  Negative for activity change and appetite change.  HENT:  Negative for sinus pressure and sore throat.   Respiratory:  Negative for chest tightness, shortness of breath and wheezing.   Cardiovascular:  Negative for chest pain and palpitations.  Gastrointestinal:  Negative for abdominal distention, abdominal pain and  constipation.  Genitourinary: Negative.   Musculoskeletal:        See HPI  Psychiatric/Behavioral:  Negative for behavioral problems and dysphoric mood.     Objective:  BP (!) 141/87   Pulse 74   Temp 98 F (36.7 C) (Oral)   Ht _0  (1.6 m)   Wt 107 lb 9.6 oz (48.8 kg)   SpO2 100%   BMI 19.06 kg/m      07/27/2022    3:02 PM 07/16/2022   10:39 AM 07/03/2022    8:29 AM  BP/Weight  Systolic BP 572 620 355  Diastolic BP 87 88 974  Wt. (Lbs) 107.6    BMI 19.06 kg/m2        Physical Exam Constitutional:      Appearance: She is well-developed.  Cardiovascular:     Rate and Rhythm: Normal rate.     Heart sounds: Normal heart sounds. No murmur heard. Pulmonary:     Effort: Pulmonary effort is normal.     Breath sounds: Normal breath sounds. No wheezing or rales.  Chest:     Chest wall: No tenderness.  Abdominal:     General: Bowel sounds are normal. There is no distension.     Palpations: Abdomen is soft. There is no mass.     Tenderness: There is no abdominal tenderness.  Musculoskeletal:        General: Normal range of motion.     Cervical back: Tenderness (on palpation of right trapezius muscle) present.     Right lower leg: No edema.     Left lower leg: No edema.     Comments: Normal appearance of both shoulders with no tenderness on palpation or range of motion of shoulders Negative Hawkins and Neer signs Normal handgrip  Neurological:     Mental Status: She is alert and oriented to person, place, and time.  Psychiatric:        Mood and Affect: Mood normal.        Latest Ref Rng & Units 04/28/2022    9:21 AM 10/28/2021    9:40 AM 04/24/2021   11:33 AM  CMP  Glucose 70 - 99 mg/dL 87  72  83   Marquez 6 - 24 mg/dL 37  36  29   Creatinine 0.57 - 1.00 mg/dL 2.46  2.06  2.08   Sodium 134 - 144 mmol/L 140  139  139   Potassium 3.5 - 5.2 mmol/L 4.5  4.2  3.9   Chloride 96 - 106 mmol/L 103  105  102   CO2 20 -  29 mmol/L _0 Calcium 8.7 - 10.2 mg/dL 9.5   9.6  9.4   Total Protein 6.0 - 8.5 g/dL  6.9    Total Bilirubin 0.0 - 1.2 mg/dL  0.3    Alkaline Phos 44 - 121 IU/L  128    AST 0 - 40 IU/L  26    ALT 0 - 32 IU/L  17      Lipid Panel     Component Value Date/Time   CHOL 223 (H) 04/28/2022 0921   TRIG 109 04/28/2022 0921   HDL 75 04/28/2022 0921   CHOLHDL 3.2 01/03/2020 1055   CHOLHDL 4.0 09/25/2016 0949   VLDL 17 09/25/2016 0949   LDLCALC 129 (H) 04/28/2022 0921    CBC    Component Value Date/Time   WBC 4.0 05/08/2020 1000   WBC 5.0 01/19/2017 2118   RBC 4.38 05/08/2020 1000   RBC 4.41 01/19/2017 2118   HGB 15.0 05/08/2020 1000   HCT 43.3 05/08/2020 1000   PLT 222 05/08/2020 1000   MCV 99 (H) 05/08/2020 1000   MCH 34.2 (H) 05/08/2020 1000   MCH 31.5 01/19/2017 2118   MCHC 34.6 05/08/2020 1000   MCHC 33.4 01/19/2017 2118   RDW 14.0 05/08/2020 1000   LYMPHSABS 2.0 01/19/2017 2118   MONOABS 0.3 01/19/2017 2118   EOSABS 0.3 01/19/2017 2118   BASOSABS 0.1 01/19/2017 2118    Lab Results  Component Value Date   HGBA1C 5.6 01/03/2020    Assessment & Plan:  1. Benign hypertension with chronic kidney disease, stage IV (HCC) Slightly above goal BP was normal at last office visit hence I will make no regimen change today Hypertensive nephropathy- avoid nephrotoxins Keep appointment with nephrologist Counseled on blood pressure goal of less than 130/80, low-sodium, DASH diet, medication compliance, 150 minutes of moderate intensity exercise per week. Discussed medication compliance, adverse effects.  - CMP14+EGFR; Future - cloNIDine (CATAPRES) 0.1 MG tablet; TAKE 1 TABLET (0.1 MG TOTAL) BY MOUTH 3 (THREE) TIMES DAILY.  Dispense: 270 tablet; Refill: 1  2. Neck pain Likely muscle strain Advised to apply heat Will place on low-dose muscle relaxant - cyclobenzaprine (FLEXERIL) 5 MG tablet; Take 1 tablet (5 mg total) by mouth at bedtime.  Dispense: 30 tablet; Refill: 1 - Ambulatory referral to Physical Therapy  3.  Mixed hyperlipidemia Uncontrolled She has been on Lipitor Low-cholesterol diet Will check lipid panel - LP+Non-HDL Cholesterol; Future    Meds ordered this encounter  Medications   cloNIDine (CATAPRES) 0.1 MG tablet    Sig: TAKE 1 TABLET (0.1 MG TOTAL) BY MOUTH 3 (THREE) TIMES DAILY.    Dispense:  270 tablet    Refill:  1   cyclobenzaprine (FLEXERIL) 5 MG tablet    Sig: Take 1 tablet (5 mg total) by mouth at bedtime.    Dispense:  30 tablet    Refill:  1    Follow-up: Return in about 3 months (around 10/27/2022) for Chronic medical conditions.       Charlott Rakes, MD, FAAFP. [facility] [place], [place]   07/27/2022, 3:37 PM

## 2022-07-27 NOTE — Progress Notes (Signed)
Pain in right shoulder.

## 2022-07-27 NOTE — Patient Instructions (Signed)
Cervical Sprain A cervical sprain is a stretch or tear in one or more of the ligaments in the neck. Ligaments are the tissues that connect bones. Cervical sprains can range from mild to severe. Severe cervical sprains can cause the spinal bones (vertebrae) in the neck to be unstable. This can result in spinal cord damage and in serious nervous system problems. The time that it takes for a cervical sprain to heal depends on the cause and extent of the injury. Most cervical sprains heal in 4-6 weeks. What are the causes? Cervical sprains may be caused by trauma, such as an injury from a motor vehicle accident, a fall, or a sudden forward and backward whipping movement of the head and neck (whiplash injury). Mild cervical sprains may be caused by wear and tear over time. What increases the risk? The following factors may make you more likely to develop this condition: Participating in activities that have a high risk of trauma to the neck. These include contact sports, auto racing, gymnastics, and diving. Taking risks when driving or riding in a motor vehicle. Osteoarthritis of the spine. Poor strength and flexibility of the neck. A previous neck injury. Poor posture. Spending long periods in certain positions that put stress on the neck, such as sitting at a computer for a long time. What are the signs or symptoms? Symptoms of this condition include: Pain, soreness, stiffness, tenderness, swelling, or a burning sensation in the front, back, or sides of the neck, shoulders, or upper back. Sudden tightening of neck muscles (spasms). Limited ability to move the neck. Headache. Dizziness. Nausea or vomiting. Weakness, numbness, or tingling in a hand or an arm. Symptoms may develop right away after injury, or they may develop over a few days. In some cases, symptoms may go away with treatment and return (recur) over time. How is this diagnosed? This condition may be diagnosed based on: Your  medical history. Your symptoms. Any recent injuries or known neck problems that you have, such as arthritis in the neck. A physical exam. Imaging tests, such as X-rays, MRI, and CT scan. How is this treated? This condition is treated by resting and icing the injured area and doing physical therapy exercises. Heat therapy may be used 2-3 days after the injury occurred if there is no swelling. Depending on the severity of your condition, treatment may also include: Keeping your neck in place (immobilized) for periods of time. This may be done using: A cervical collar. This supports your chin and the back of your head. A cervical traction device. This is a sling that holds up your head. The device removes weight and pressure from your neck, and it may help to relieve pain. Medicines that help to relieve pain and inflammation. Medicines that help to relax your muscles (muscle relaxants). Surgery. This is rare. Follow these instructions at home: Medicines  Take over-the-counter and prescription medicines only as told by your health care provider. Ask your health care provider if the medicine prescribed to you: Requires you to avoid driving or using heavy machinery. Can cause constipation. You may need to take these actions to prevent or treat constipation: Drink enough fluid to keep your urine pale yellow. Take over-the-counter or prescription medicines. Eat foods that are high in fiber, such as beans, whole grains, and fresh fruits and vegetables. Limit foods that are high in fat and processed sugars, such as fried or sweet foods. If you have a cervical collar: Wear the collar as told by your  health care provider. Do not remove it unless told. Ask before making any adjustments to your collar. If you have long hair, keep it outside of the collar. Ask your health care provider if you may remove the collar for cleaning and bathing. If so: Follow instructions about how to remove it  safely. Clean it by hand with mild soap and water and air-dry it completely. If your collar has removable pads, remove them every 1-2 days and wash them by hand with soap and water. Let them air-dry completely before putting them back in the collar. Tell your health care provider if your skin under the collar has irritation or sores. Managing pain, stiffness, and swelling     If directed, use a cervical traction device as told. If directed, put ice on the affected area. To do this: Put ice in a plastic bag. Place a towel between your skin and the bag. Leave the ice on for 20 minutes, 2-3 times a day. If directed, apply heat to the affected area before you do your physical therapy or as often as told by your health care provider. Use the heat source that your health care provider recommends, such as a moist heat pack or a heating pad. Place a towel between your skin and the heat source. Leave the heat on for 20-30 minutes. Remove the heat if your skin turns bright red. This is especially important if you are unable to feel pain, heat, or cold. You may have a greater risk of getting burned. Activity Do not drive while wearing a cervical collar. If you do not have a cervical collar, ask if it is safe to drive while your neck heals. Do not lift anything that is heavier than 10 lb (4.5 kg), or the limit that you are told, until your health care provider says that it is safe. Rest as told by your health care provider. If physical therapy was prescribed, do exercises as told by your health care provider or physical therapist. Return to your normal activities as told by your health care provider. Avoid positions and activities that make your symptoms worse. Ask your health care provider what activities are safe for you. General instructions Do not use any products that contain nicotine or tobacco, such as cigarettes, e-cigarettes, and chewing tobacco. These can delay healing. If you need help  quitting, ask your health care provider. Keep all follow-up visits as told by your health care provider or physical therapist. This is important. How is this prevented? To prevent a cervical sprain from happening again: Use and maintain good posture. Make any needed adjustments to your workstation to help you do this. Exercise regularly as told by your health care provider or physical therapist. Avoid risky activities that may cause a cervical sprain. Contact a health care provider if you have: Symptoms that get worse or do not get better after 2 weeks of treatment. Pain that gets worse or does not get better with medicine. New, unexplained symptoms. Sores or irritated skin on your neck from wearing your cervical collar. Get help right away if: You have severe pain. You develop numbness, tingling, or weakness in any part of your body. You cannot move a part of your body (you have paralysis). You have neck pain along with severe dizziness or headache. Summary A cervical sprain is a stretch or tear in one or more of the ligaments in the neck. Cervical sprains may be caused by trauma, such as an injury from a motor vehicle  accident, a fall, or a sudden forward and backward whipping movement of the head and neck (whiplash injury). Symptoms may develop right away after injury, or they may develop over a few days. This condition may be treated with rest, ice, heat, medicines, physical therapy, and surgery. This information is not intended to replace advice given to you by your health care provider. Make sure you discuss any questions you have with your health care provider. Document Revised: 11/24/2021 Document Reviewed: 04/26/2019 Elsevier Patient Education  [place].

## 2022-07-29 ENCOUNTER — Other Ambulatory Visit: Payer: Self-pay

## 2022-07-29 MED ORDER — ACETAMINOPHEN-CODEINE 300-30 MG PO TABS
1.0000 | ORAL_TABLET | Freq: Four times a day (QID) | ORAL | 0 refills | Status: DC
  Filled 2022-07-29: qty 12, 3d supply, fill #0

## 2022-07-31 ENCOUNTER — Other Ambulatory Visit: Payer: Commercial Managed Care - HMO

## 2022-08-03 ENCOUNTER — Ambulatory Visit: Payer: Commercial Managed Care - HMO | Attending: Family Medicine

## 2022-08-03 DIAGNOSIS — E782 Mixed hyperlipidemia: Secondary | ICD-10-CM

## 2022-08-03 DIAGNOSIS — I129 Hypertensive chronic kidney disease with stage 1 through stage 4 chronic kidney disease, or unspecified chronic kidney disease: Secondary | ICD-10-CM

## 2022-08-04 LAB — CMP14+EGFR
ALT: 19 IU/L (ref 0–32)
AST: 26 IU/L (ref 0–40)
Albumin/Globulin Ratio: 1.4 (ref 1.2–2.2)
Albumin: 4 g/dL (ref 3.8–4.9)
Alkaline Phosphatase: 112 IU/L (ref 44–121)
BUN/Creatinine Ratio: 13 (ref 9–23)
BUN: 28 mg/dL — ABNORMAL HIGH (ref 6–24)
Bilirubin Total: <0.2 mg/dL (ref 0.0–1.2)
CO2: 22 mmol/L (ref 20–29)
Calcium: 9.1 mg/dL (ref 8.7–10.2)
Chloride: 105 mmol/L (ref 96–106)
Creatinine, Ser: 2.23 mg/dL — ABNORMAL HIGH (ref 0.57–1.00)
Globulin, Total: 2.9 g/dL (ref 1.5–4.5)
Glucose: 91 mg/dL (ref 70–99)
Potassium: 4.3 mmol/L (ref 3.5–5.2)
Sodium: 142 mmol/L (ref 134–144)
Total Protein: 6.9 g/dL (ref 6.0–8.5)
eGFR: 25 mL/min/{1.73_m2} — ABNORMAL LOW (ref >59)

## 2022-08-04 LAB — LP+NON-HDL CHOLESTEROL
Cholesterol, Total: 180 mg/dL (ref 100–199)
HDL: 66 mg/dL (ref >39)
LDL Chol Calc (NIH): 96 mg/dL (ref 0–99)
Total Non-HDL-Chol (LDL+VLDL): 114 mg/dL (ref 0–129)
Triglycerides: 103 mg/dL (ref 0–149)
VLDL Cholesterol Cal: 18 mg/dL (ref 5–40)

## 2022-08-10 ENCOUNTER — Other Ambulatory Visit: Payer: Self-pay

## 2022-08-10 MED ORDER — METHYLPREDNISOLONE 4 MG PO TBPK
ORAL_TABLET | ORAL | 0 refills | Status: DC
  Filled 2022-08-10: qty 21, 6d supply, fill #0

## 2022-08-11 ENCOUNTER — Other Ambulatory Visit: Payer: Self-pay

## 2022-08-14 ENCOUNTER — Other Ambulatory Visit: Payer: Self-pay

## 2022-08-20 ENCOUNTER — Other Ambulatory Visit: Payer: Self-pay

## 2022-08-21 ENCOUNTER — Other Ambulatory Visit: Payer: Self-pay

## 2022-08-23 IMAGING — MG MM DIGITAL SCREENING BILAT W/ TOMO AND CAD
8 series · 9 of 24 positions shown · non-contrast
Comparison: None.

CLINICAL DATA: Screening.

EXAM:
DIGITAL SCREENING BILATERAL MAMMOGRAM WITH TOMOSYNTHESIS AND CAD
TECHNIQUE: Bilateral screening digital craniocaudal and mediolateral oblique
mammograms were obtained. Bilateral screening digital breast
tomosynthesis was performed. The images were evaluated with
computer-aided detection.

[L CC synth-2D]
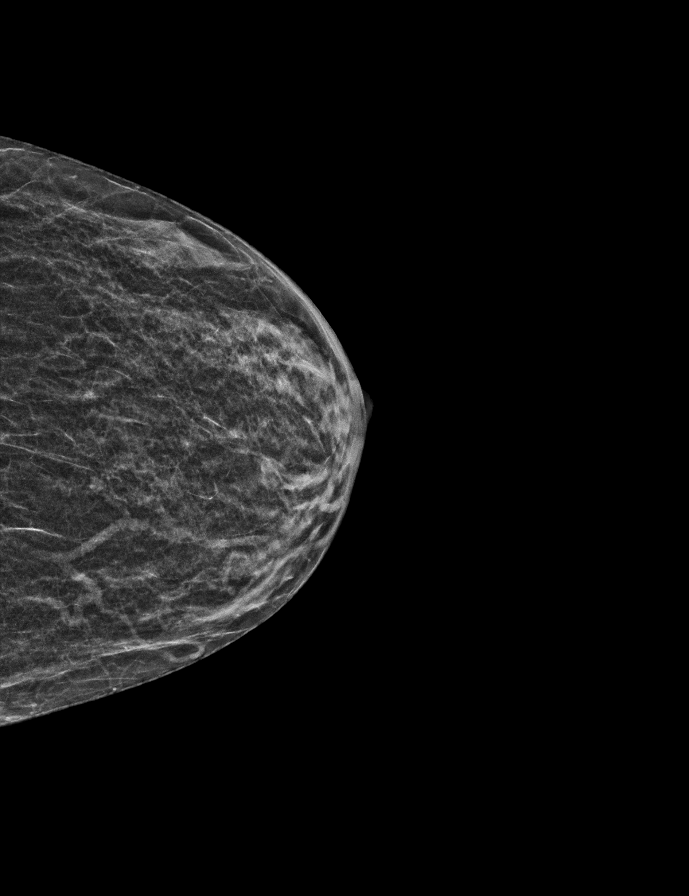

[R MLO synth-2D]
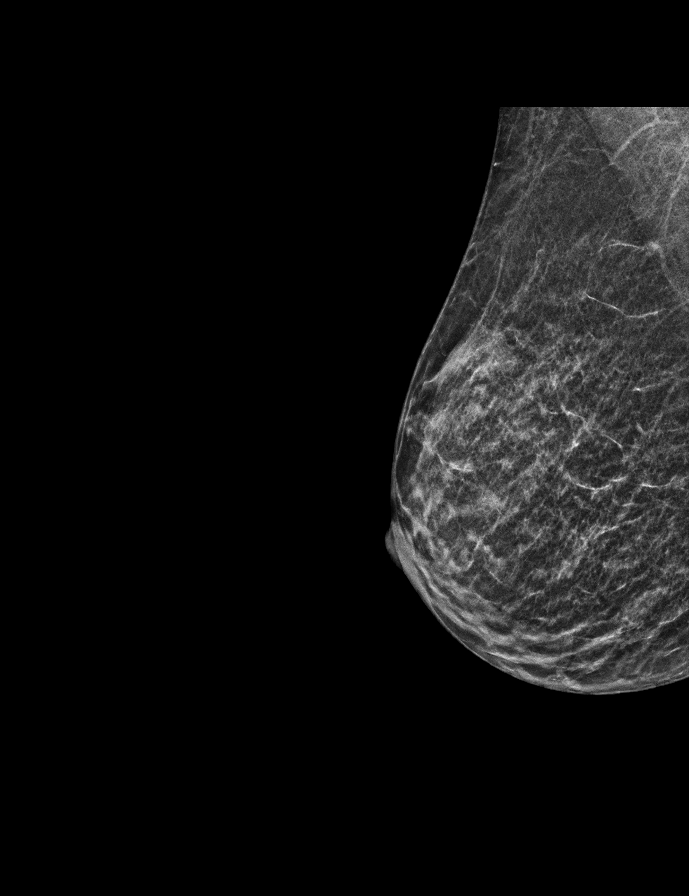

[L MLO synth-2D]
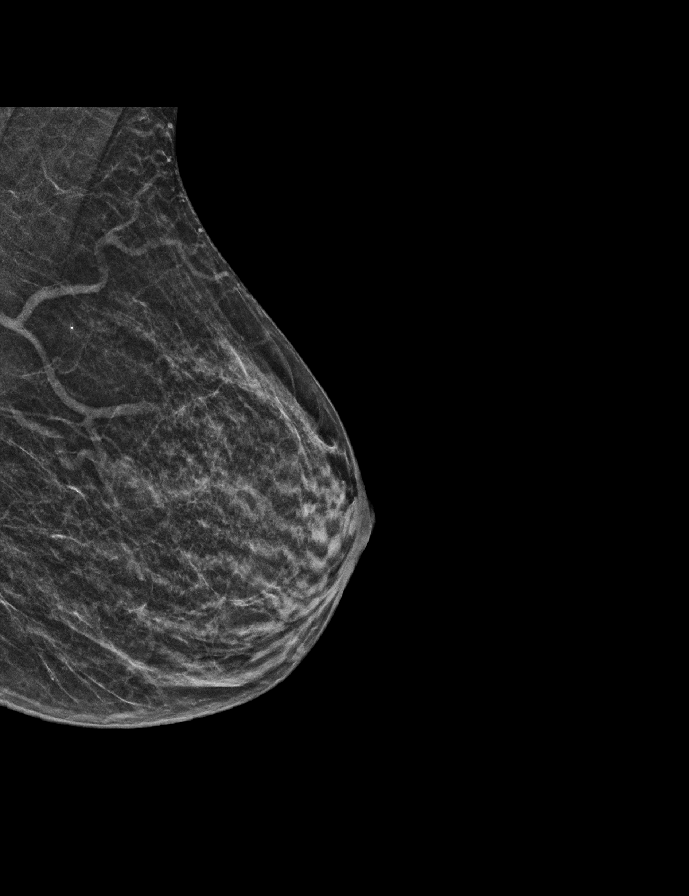

[R CC synth-2D]
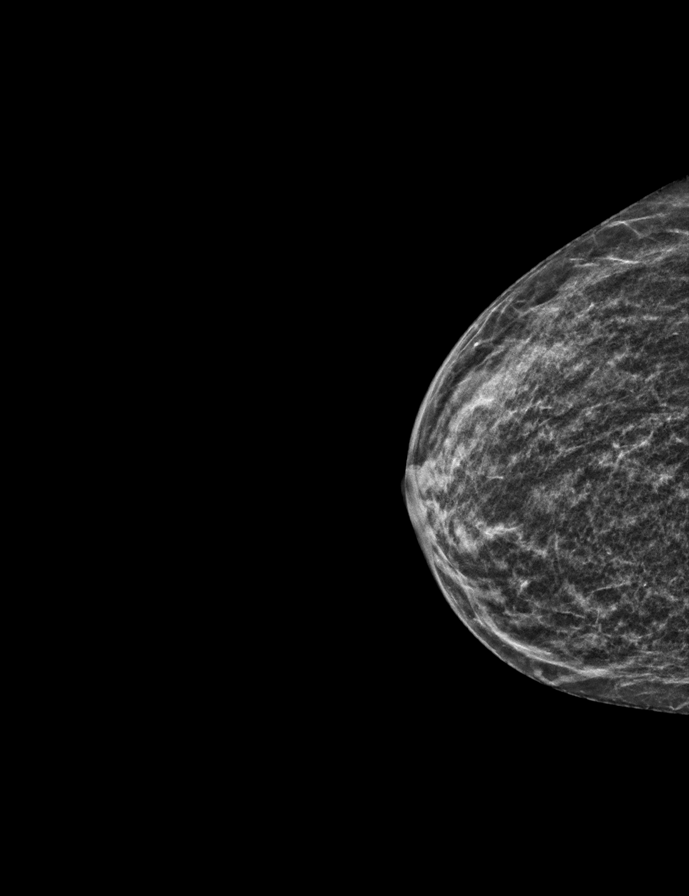

[R CC tomo · 2 of 29 frames shown]
[frame 10/29]
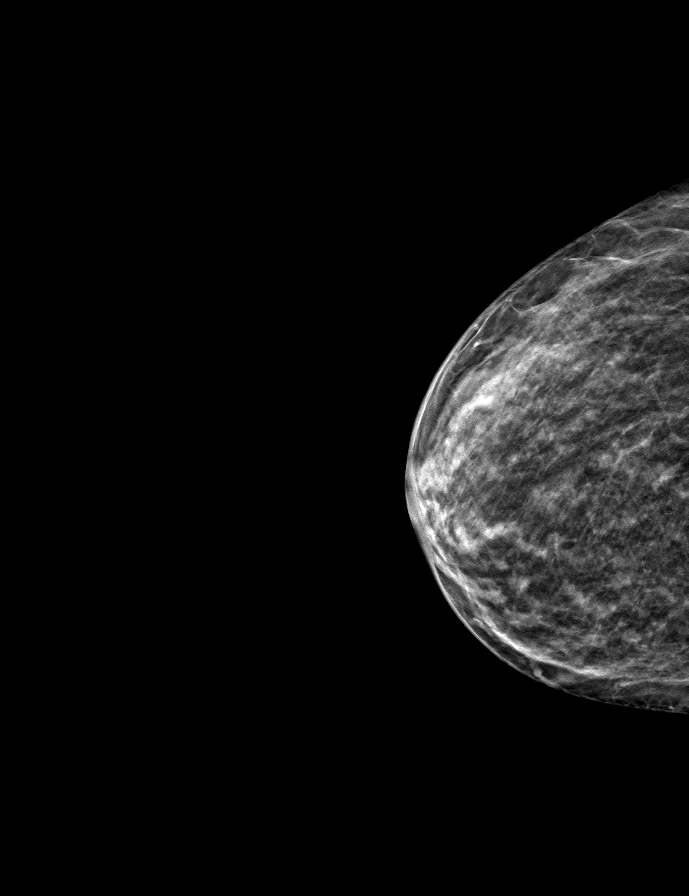
[frame 15/29]
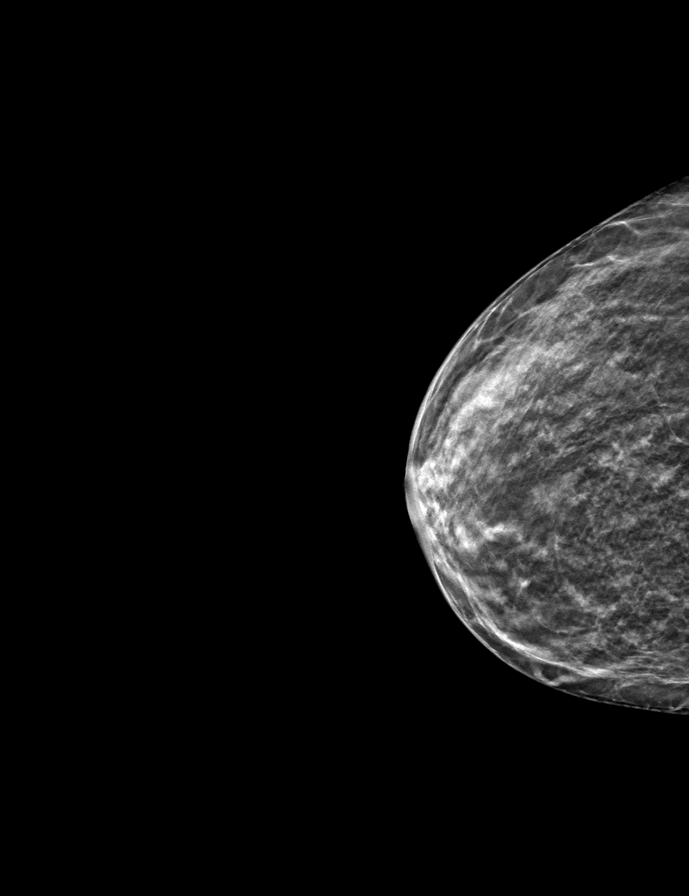

[R MLO tomo · tomo slice 15/28.0]
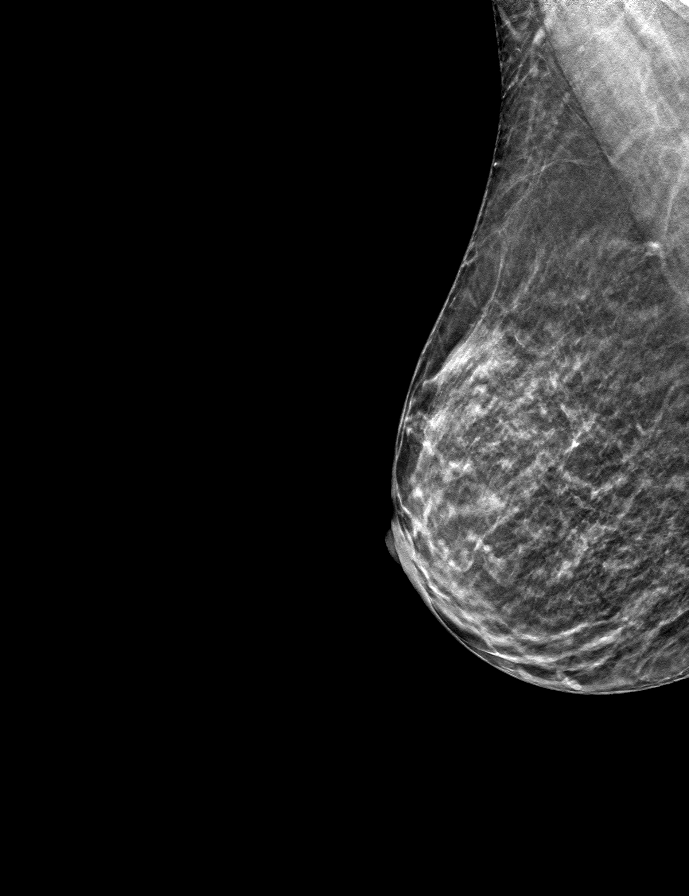

[L CC tomo · tomo slice 16/31.0]
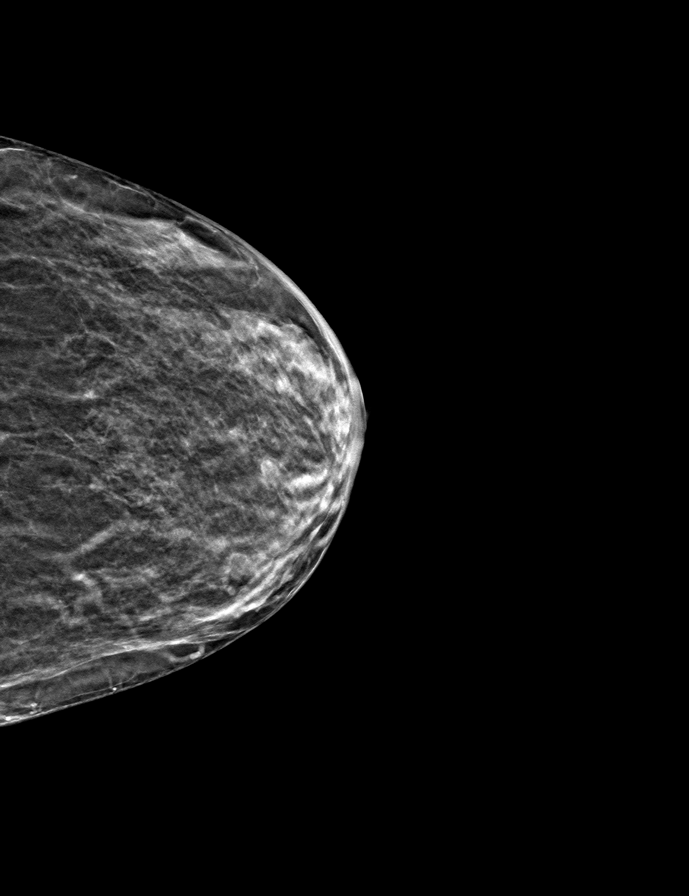

[L MLO tomo · tomo slice 17/33.0]
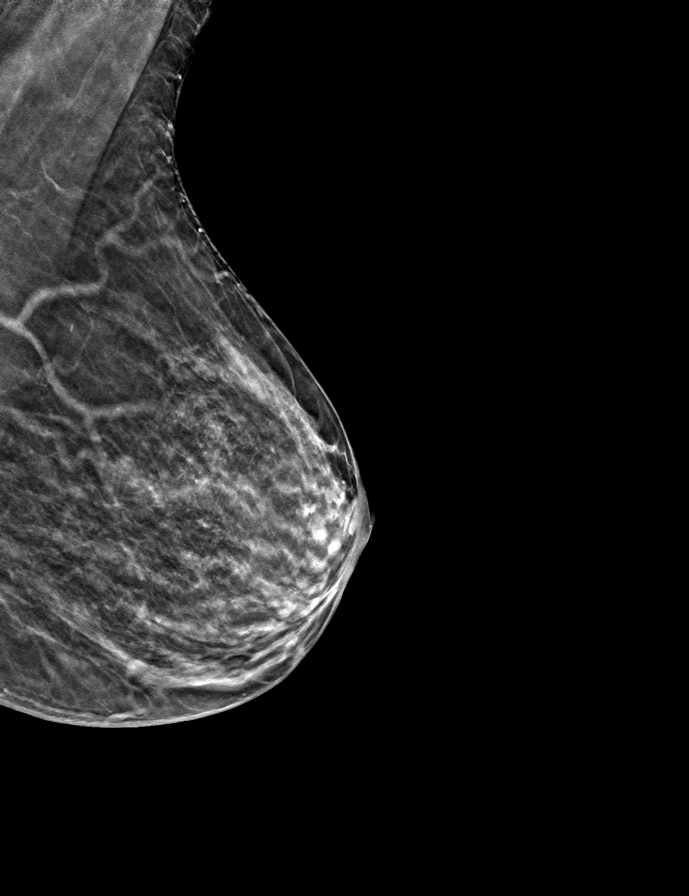

[9 of 24 positions shown; findings below may reference images not displayed]

ACR Breast Density Category c: The breast tissue is heterogeneously
dense, which may obscure small masses.
FINDINGS: In the right breast, a possible mass warrants further evaluation. In
the left breast, no findings suspicious for malignancy.
IMPRESSION: Further evaluation is suggested for a possible mass in the right
breast.

RECOMMENDATION:
Diagnostic mammogram and possibly ultrasound of the right breast.
(Code:VQ-[id number])

The patient will be contacted regarding the findings, and additional
imaging will be scheduled.

BI-RADS CATEGORY  0: Incomplete. Need additional imaging evaluation
and/or prior mammograms for comparison.

## 2022-08-27 NOTE — Therapy (Signed)
OUTPATIENT PHYSICAL THERAPY EVALUATION   Patient Name: Rebecca Marquez MRN: [id number] DOB:[birth year], 61 y.o., female Today's Date: 08/28/2022  END OF SESSION:  PT End of Session - 08/28/22 0752     Visit Number 1    Number of Visits 7    Date for PT Re-Evaluation 10/09/22    Authorization Type [employer]    PT Start Time 0800    PT Stop Time 0845    PT Time Calculation (min) 45 min    Activity Tolerance Patient tolerated treatment well    Behavior During Therapy [facility] for tasks assessed/performed             Past Medical History:  Diagnosis Date   Hypertension Dx March 2015   Kidney disease    Renal disorder    R/T HTN   Seasonal allergies    Past Surgical History:  Procedure Laterality Date   TUBAL LIGATION     Patient Active Problem List   Diagnosis Date Noted   Lumbar degenerative disc disease 10/07/2019   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   Transient vision disturbance of both eyes 09/25/2016   Poor dentition 02/06/2016   Glossitis 04/29/2015   HTN (hypertension) 12/11/2013   HLD (hyperlipidemia) 11/16/2013   CKD (chronic kidney disease) stage 4, GFR 15-29 ml/min ([place]) 11/16/2013    PCP: Charlott Rakes, MD  REFERRING PROVIDER: Charlott Rakes, MD  REFERRING DIAG: Neck pain  THERAPY DIAG:  Cervicalgia  Muscle weakness (generalized)  Rationale for Evaluation and Treatment: Rehabilitation  ONSET DATE: Ongoing for months   SUBJECTIVE:                                                                                                                                                                                                        SUBJECTIVE STATEMENT: Patient reports right neck and shoulder pain if she works a lot or when she is sleeping. Ongoing for "some months." No specific mechanism, she works at the [employer] so could be from lifting a lot. Denies any specific activities to aggravate her pain, pain is intermittent. States she hasn't really tried anything  to make if feel better, she just deals with it. Denies any pain in the right arm, denies any numbness or tingling. Patient is right handed.   PERTINENT HISTORY:  None  PAIN:  Are you having pain? Yes:  NPRS scale: 0/10 (6-7/10 when pain occurs) Pain location: Neck, right Pain description: "sore muscle", burning Aggravating factors: Lifting, sleeping, can come on randomly Relieving factors: Nothing  PRECAUTIONS: None  WEIGHT BEARING RESTRICTIONS: No  FALLS:  Has patient fallen in last 6 months? No  OCCUPATION: Works at post office  PLOF: Independent  PATIENT GOALS: Pain relief   OBJECTIVE:  PATIENT SURVEYS:  FOTO 55% functional status  COGNITION: Overall cognitive status: Within functional limits for tasks assessed  SENSATION: WFL  POSTURE:   Rounded shoulder, forward head  PALPATION: Tender to palpation right upper trap with increased muscle tension   CERVICAL ROM:   Active ROM A/PROM (deg) eval  Flexion 65  Extension 40  Right lateral flexion 35  Left lateral flexion 25  Right rotation 60  Left rotation 60    Patient reports right neck tightness with all cervical motion  UPPER EXTREMITY ROM:   UE AROM grossly WFL and non-painful  UPPER EXTREMITY MMT:  MMT Right eval Left eval  Shoulder flexion 4 5  Shoulder extension 5 5  Shoulder abduction 5 5  Shoulder internal rotation 5 5  Shoulder external rotation 4 5  Middle trapezius 4- 4  Lower trapezius 4- 4  Elbow flexion 5 5  Elbow extension 5 5   CERVICAL SPECIAL TESTS:  Radicular testing negative  FUNCTIONAL TESTS:  DNF endurance: 4 seconds   TODAY'S TREATMENT:         [facility] Adult PT Treatment:                                                DATE: 08/28/2022 Therapeutic Exercise: Supine chin tuck with lift x 10 Supine horizontal abduction with red x 10 Upper trap stretch 2 x 15 sec each Double ER and scap retraction with red x 10  PATIENT EDUCATION:  Education details: Exam  findings, POC, HEP Person educated: Patient Education method: Explanation, Demonstration, Tactile cues, Verbal cues, and Handouts Education comprehension: verbalized understanding, returned demonstration, verbal cues required, tactile cues required, and needs further education  HOME EXERCISE PROGRAM: Access Code: [email or web address]    ASSESSMENT: CLINICAL IMPRESSION: Patient is a 61 y.o. female who was seen today for physical therapy evaluation and treatment for chronic neck pain. Her pain seems to be muscular in nature and related to poor postural and cervical strength and endurance leading to compensation and reduced tolerance for activities such as lifting.  OBJECTIVE IMPAIRMENTS: decreased activity tolerance, decreased ROM, decreased strength, increased muscle spasms, impaired flexibility, postural dysfunction, and pain.   ACTIVITY LIMITATIONS: carrying, lifting, and sleeping  PARTICIPATION LIMITATIONS: meal prep, cleaning, driving, and occupation  PERSONAL FACTORS: Past/current experiences and Time since onset of injury/illness/exacerbation are also affecting patient's functional outcome.   REHAB POTENTIAL: Good  CLINICAL DECISION MAKING: Stable/uncomplicated  EVALUATION COMPLEXITY: Low   GOALS: Goals reviewed with patient? Yes  SHORT TERM GOALS: Target date: 09/18/2022  Patient will be I with initial HEP in order to progress with therapy. Baseline: HEP provided at eval Goal status: INITIAL  2.  PT will review FOTO with patient by 3rd visit in order to understand expected progress and outcome with therapy. Baseline: FOTO assessed at eval Goal status: INITIAL  LONG TERM GOALS: Target date: 10/09/2022  Patient will be I with final HEP to maintain progress from PT. Baseline: HEP provided at eval Goal status: INITIAL  2.  Patient will report >/= 72% status on FOTO to indicate improved functional ability. Baseline: 55% Goal status: INITIAL  3.  Patient will report pain level  with activity </= 2/10 in order  to reduce functional limitations. Baseline: 6-7/10 Goal status: INITIAL  4.  Patient will demonstrate cervical rotation >/= 70 deg in order to improve turning head while driving Baseline: 60 deg Goal status: INITIAL  5. Patient will demonstrate DNF endurance >/= 20 sec and parascapular musculature strength >/= 4-/5 MMT in order to improve postural control and reduce pain with lifting. Baseline: DNF endurance 4 sec, parascapular musculature strength 4/5 MMT  Goal status: INITIAL   PLAN: PT FREQUENCY: 1x/week  PT DURATION: 6 weeks  PLANNED INTERVENTIONS: Therapeutic exercises, Therapeutic activity, Neuromuscular re-education, Balance training, Gait training, Patient/Family education, Self Care, Joint mobilization, Joint manipulation, Aquatic Therapy, Dry Needling, Electrical stimulation, Spinal manipulation, Spinal mobilization, Cryotherapy, Moist heat, Taping, Manual therapy, and Re-evaluation  PLAN FOR NEXT SESSION: Review HEP and progress PRN, manual/STM/stretching for right upper trap region (dry needling as last resort as patient scared of needles), postural control and strengthening   Hilda Blades, PT, DPT, LAT, ATC 08/28/22  9:48 AM Phone: [phone] Fax: [phone]

## 2022-08-28 ENCOUNTER — Ambulatory Visit: Payer: Commercial Managed Care - HMO | Attending: Family Medicine | Admitting: Physical Therapy

## 2022-08-28 ENCOUNTER — Encounter: Payer: Self-pay | Admitting: Physical Therapy

## 2022-08-28 ENCOUNTER — Other Ambulatory Visit: Payer: Self-pay

## 2022-08-28 DIAGNOSIS — M542 Cervicalgia: Secondary | ICD-10-CM | POA: Diagnosis present

## 2022-08-28 DIAGNOSIS — M6281 Muscle weakness (generalized): Secondary | ICD-10-CM | POA: Diagnosis present

## 2022-08-28 NOTE — Patient Instructions (Signed)
Access Code: [email or web address] URL: [email or web address] Date: 08/28/2022 Prepared by: Hilda Blades  Exercises - Supine Deep Neck Flexor Training - Repetitions  - 1 x daily - 10 reps - 5 seconds hold - Supine Shoulder Horizontal Abduction with Resistance  - 1 x daily - 20 reps - Seated Cervical Sidebending Stretch  - 1 x daily - 3 reps - 15 seconds hold - Shoulder External Rotation and Scapular Retraction with Resistance  - 1 x daily - 20 reps

## 2022-09-09 NOTE — Therapy (Signed)
OUTPATIENT PHYSICAL THERAPY TREATMENT NOTE   Patient Name: Rebecca Marquez MRN: [id number] DOB:Dec 27, 1960, 62 y.o., female Today's Date: 09/11/2022  PCP: Charlott Rakes, MD REFERRING PROVIDER: Charlott Rakes, MD   END OF SESSION:   PT End of Session - 09/11/22 1044     Visit Number 2    Number of Visits 7    Date for PT Re-Evaluation 10/09/22    Authorization Type [employer]    PT Start Time 1016    PT Stop Time 1058    PT Time Calculation (min) 42 min    Activity Tolerance Patient tolerated treatment well    Behavior During Therapy [facility] for tasks assessed/performed             Past Medical History:  Diagnosis Date   Hypertension Dx March 2015   Kidney disease    Renal disorder    R/T HTN   Seasonal allergies    Past Surgical History:  Procedure Laterality Date   TUBAL LIGATION     Patient Active Problem List   Diagnosis Date Noted   Lumbar degenerative disc disease 10/07/2019   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   Transient vision disturbance of both eyes 09/25/2016   Poor dentition 02/06/2016   Glossitis 04/29/2015   HTN (hypertension) 12/11/2013   HLD (hyperlipidemia) 11/16/2013   CKD (chronic kidney disease) stage 4, GFR 15-29 ml/min ([place]) 11/16/2013    REFERRING DIAG: Neck pain   THERAPY DIAG:  Cervicalgia  Muscle weakness (generalized)  Rationale for Evaluation and Treatment Rehabilitation  PERTINENT HISTORY: None   PRECAUTIONS: None    SUBJECTIVE:       SUBJECTIVE STATEMENT:  Patient reports she is doing well. She continues to have the right sided neck tightness and soreness. She has been working on the exercises at home.  PAIN:  Are you having pain? Yes:  NPRS scale: 2/10 (6-7/10 when pain occurs) Pain location: Neck, right Pain description: "sore muscle", burning Aggravating factors: Lifting, sleeping, can come on randomly Relieving factors: Nothing   OBJECTIVE: (objective measures completed at initial evaluation unless otherwise  dated) PATIENT SURVEYS:  FOTO 55% functional status   POSTURE:             Rounded shoulder, forward head   PALPATION: Tender to palpation right upper trap with increased muscle tension      CERVICAL ROM:    Active ROM A/PROM (deg) eval   09/11/2022  Flexion 65   Extension 40   Right lateral flexion 35   Left lateral flexion 25   Right rotation 60 65  Left rotation 60 60                        Patient reports right neck tightness with all cervical motion   UPPER EXTREMITY ROM:                       UE AROM grossly WFL and non-painful   UPPER EXTREMITY MMT:   MMT Right eval Left eval  Shoulder flexion 4 5  Shoulder extension 5 5  Shoulder abduction 5 5  Shoulder internal rotation 5 5  Shoulder external rotation 4 5  Middle trapezius 4- 4  Lower trapezius 4- 4  Elbow flexion 5 5  Elbow extension 5 5    FUNCTIONAL TESTS:  DNF endurance: 4 seconds     TODAY'S TREATMENT:         [facility] Adult PT Treatment:  DATE: 09/11/2022 Therapeutic Exercise: UBE L1 x 4 min (2 fwd/bwd) while taking subjective Sidelying thoracic rotation x 10 each Supine horizontal abduction with red 2 x 10 Supine chin tuck 2 x 10 Upper trap stretch 2 x 15 sec each Double ER and scap retraction with red 2 x 10 Row with green 2 x 20 Extension with red 2 x 10 Manual: Suboccipital release with gentle manual traction Passive upper trap and levator scap stretching   [facility] Adult PT Treatment:                                                DATE: 08/28/2022 Therapeutic Exercise: Supine chin tuck with lift x 10 Supine horizontal abduction with red x 10 Upper trap stretch 2 x 15 sec each Double ER and scap retraction with red x 10   PATIENT EDUCATION:  Education details: HEP Person educated: Patient Education method: Consulting civil engineer, Demonstration, Corporate treasurer cues, Verbal cues Education comprehension: verbalized understanding, returned demonstration, verbal  cues required, tactile cues required, and needs further education   HOME EXERCISE PROGRAM: Access Code: [email or web address]      ASSESSMENT: CLINICAL IMPRESSION: Patient tolerated therapy well with no adverse effects. Therapy focused on progressing postural strengthening and control with good tolerance. She does exhibit improved cervical rotation to the right but does report right sided neck tightness with movement. She requires consistent cueing to avoid shrug and upper trap dominance with movement. Updated HEP to progress neck and thoracic mobility. Patient would benefit from continued skilled PT to progress her mobility and strength in order to reduce pain and maximize functional ability.    OBJECTIVE IMPAIRMENTS: decreased activity tolerance, decreased ROM, decreased strength, increased muscle spasms, impaired flexibility, postural dysfunction, and pain.    ACTIVITY LIMITATIONS: carrying, lifting, and sleeping   PARTICIPATION LIMITATIONS: meal prep, cleaning, driving, and occupation   PERSONAL FACTORS: Past/current experiences and Time since onset of injury/illness/exacerbation are also affecting patient's functional outcome.      GOALS: Goals reviewed with patient? Yes   SHORT TERM GOALS: Target date: 09/18/2022   Patient will be I with initial HEP in order to progress with therapy. Baseline: HEP provided at eval Goal status: INITIAL   2.  PT will review FOTO with patient by 3rd visit in order to understand expected progress and outcome with therapy. Baseline: FOTO assessed at eval Goal status: INITIAL   LONG TERM GOALS: Target date: 10/09/2022   Patient will be I with final HEP to maintain progress from PT. Baseline: HEP provided at eval Goal status: INITIAL   2.  Patient will report >/= 72% status on FOTO to indicate improved functional ability. Baseline: 55% Goal status: INITIAL   3.  Patient will report pain level with activity </= 2/10 in order to reduce functional  limitations. Baseline: 6-7/10 Goal status: INITIAL   4.  Patient will demonstrate cervical rotation >/= 70 deg in order to improve turning head while driving Baseline: 60 deg Goal status: INITIAL   5. Patient will demonstrate DNF endurance >/= 20 sec and parascapular musculature strength >/= 4-/5 MMT in order to improve postural control and reduce pain with lifting. Baseline: DNF endurance 4 sec, parascapular musculature strength 4/5 MMT            Goal status: INITIAL     PLAN: PT FREQUENCY: 1x/week   PT DURATION: 6 weeks  PLANNED INTERVENTIONS: Therapeutic exercises, Therapeutic activity, Neuromuscular re-education, Balance training, Gait training, Patient/Family education, Self Care, Joint mobilization, Joint manipulation, Aquatic Therapy, Dry Needling, Electrical stimulation, Spinal manipulation, Spinal mobilization, Cryotherapy, Moist heat, Taping, Manual therapy, and Re-evaluation   PLAN FOR NEXT SESSION: Review HEP and progress PRN, manual/STM/stretching for right upper trap region (dry needling as last resort as patient scared of needles), postural control and strengthening   Hilda Blades, PT, DPT, LAT, ATC 09/11/22  11:01 AM Phone: [phone] Fax: [phone]

## 2022-09-11 ENCOUNTER — Other Ambulatory Visit: Payer: Self-pay

## 2022-09-11 ENCOUNTER — Encounter: Payer: Self-pay | Admitting: Physical Therapy

## 2022-09-11 ENCOUNTER — Ambulatory Visit: Payer: Commercial Managed Care - HMO | Attending: Family Medicine | Admitting: Physical Therapy

## 2022-09-11 DIAGNOSIS — M6281 Muscle weakness (generalized): Secondary | ICD-10-CM | POA: Insufficient documentation

## 2022-09-11 DIAGNOSIS — M542 Cervicalgia: Secondary | ICD-10-CM | POA: Insufficient documentation

## 2022-09-11 NOTE — Patient Instructions (Signed)
Access Code: [email or web address] URL: [email or web address] Date: 09/11/2022 Prepared by: Hilda Blades  Exercises - Sidelying Thoracic Lumbar Rotation  - 1 x daily - 10 reps - 5 seconds hold - Supine Deep Neck Flexor Training - Repetitions  - 1 x daily - 10 reps - 5 seconds hold - Supine Shoulder Horizontal Abduction with Resistance  - 1 x daily - 20 reps - Seated Cervical Sidebending Stretch  - 1 x daily - 3 reps - 15 seconds hold - Shoulder External Rotation and Scapular Retraction with Resistance  - 1 x daily - 20 reps

## 2022-09-18 ENCOUNTER — Ambulatory Visit: Payer: Commercial Managed Care - HMO | Admitting: Physical Therapy

## 2022-09-21 ENCOUNTER — Other Ambulatory Visit: Payer: Self-pay

## 2022-09-22 ENCOUNTER — Encounter: Payer: Self-pay | Admitting: Physical Therapy

## 2022-09-22 ENCOUNTER — Ambulatory Visit: Payer: Commercial Managed Care - HMO | Admitting: Physical Therapy

## 2022-09-22 ENCOUNTER — Other Ambulatory Visit: Payer: Self-pay

## 2022-09-22 DIAGNOSIS — M542 Cervicalgia: Secondary | ICD-10-CM | POA: Diagnosis not present

## 2022-09-22 DIAGNOSIS — M6281 Muscle weakness (generalized): Secondary | ICD-10-CM

## 2022-09-22 NOTE — Patient Instructions (Signed)
Access Code: [email or web address] URL: [email or web address] Date: 09/22/2022 Prepared by: Hilda Blades  Exercises - Sidelying Thoracic Lumbar Rotation  - 1 x daily - 10 reps - 5 seconds hold - Thoracic Extension Mobilization with Noodle  - 1 x daily - 10 reps - Supine Deep Neck Flexor Training - Repetitions  - 1 x daily - 10 reps - 5 seconds hold - Supine Shoulder Horizontal Abduction with Resistance  - 1 x daily - 20 reps - Seated Cervical Sidebending Stretch  - 1 x daily - 3 reps - 15 seconds hold - Shoulder External Rotation and Scapular Retraction with Resistance  - 1 x daily - 20 reps

## 2022-09-22 NOTE — Therapy (Signed)
OUTPATIENT PHYSICAL THERAPY TREATMENT NOTE   Patient Name: Rebecca Marquez MRN: [id number] DOB:[birth year], 62 y.o., female Today's Date: 09/22/2022  PCP: Charlott Rakes, MD REFERRING PROVIDER: Charlott Rakes, MD   END OF SESSION:   PT End of Session - 09/22/22 1631     Visit Number 3    Number of Visits 7    Date for PT Re-Evaluation 10/09/22    Authorization Type [employer]    PT Start Time 1628    PT Stop Time 1700    PT Time Calculation (min) 32 min    Activity Tolerance Patient tolerated treatment well    Behavior During Therapy [facility] for tasks assessed/performed              Past Medical History:  Diagnosis Date   Hypertension Dx March 2015   Kidney disease    Renal disorder    R/T HTN   Seasonal allergies    Past Surgical History:  Procedure Laterality Date   TUBAL LIGATION     Patient Active Problem List   Diagnosis Date Noted   Lumbar degenerative disc disease 10/07/2019   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   Transient vision disturbance of both eyes 09/25/2016   Poor dentition 02/06/2016   Glossitis 04/29/2015   HTN (hypertension) 12/11/2013   HLD (hyperlipidemia) 11/16/2013   CKD (chronic kidney disease) stage 4, GFR 15-29 ml/min ([place]) 11/16/2013    REFERRING DIAG: Neck pain   THERAPY DIAG:  Cervicalgia  Muscle weakness (generalized)  Rationale for Evaluation and Treatment Rehabilitation  PERTINENT HISTORY: None   PRECAUTIONS: None    SUBJECTIVE:       SUBJECTIVE STATEMENT:  Patient reports she is doing well. Her neck is feeling better, but still does have pain on the right side.  PAIN:  Are you having pain? Yes:  NPRS scale: 2/10 (6-7/10 when pain occurs) Pain location: Neck, right Pain description: "sore muscle", burning Aggravating factors: Lifting, sleeping, can come on randomly Relieving factors: Nothing   OBJECTIVE: (objective measures completed at initial evaluation unless otherwise dated) PATIENT SURVEYS:  FOTO 55% functional  status   POSTURE:             Rounded shoulder, forward head   PALPATION: Tender to palpation right upper trap with increased muscle tension      CERVICAL ROM:    Active ROM A/PROM (deg) eval   09/11/2022  Flexion 65   Extension 40   Right lateral flexion 35   Left lateral flexion 25   Right rotation 60 65  Left rotation 60 60                        Patient reports right neck tightness with all cervical motion   UPPER EXTREMITY ROM:                       UE AROM grossly WFL and non-painful   UPPER EXTREMITY MMT:   MMT Right eval Left eval  Shoulder flexion 4 5  Shoulder extension 5 5  Shoulder abduction 5 5  Shoulder internal rotation 5 5  Shoulder external rotation 4 5  Middle trapezius 4- 4  Lower trapezius 4- 4  Elbow flexion 5 5  Elbow extension 5 5    FUNCTIONAL TESTS:  DNF endurance: 4 seconds     TODAY'S TREATMENT:         [facility] Adult PT Treatment:  DATE: 09/22/2022 Therapeutic Exercise: UBE L3 x 4 min (2 fwd/bwd) while taking subjective Sidelying thoracic rotation x 10 each Supine thoracic extension over FR at various ranges Corner pec stretch 3 x 20 sec Upper trap stretch 2 x 15 sec each Supine horizontal abduction with green 2 x 10 Seated chin tuck 2 x 10 Double ER and scap retraction with green 2 x 10 Row machine 25# x 10, 20# x 10, 15# x 10   [facility] Adult PT Treatment:                                                DATE: 09/11/2022 Therapeutic Exercise: UBE L1 x 4 min (2 fwd/bwd) while taking subjective Sidelying thoracic rotation x 10 each Supine horizontal abduction with red 2 x 10 Supine chin tuck 2 x 10 Upper trap stretch 2 x 15 sec each Double ER and scap retraction with red 2 x 10 Row with green 2 x 20 Extension with red 2 x 10 Manual: Suboccipital release with gentle manual traction Passive upper trap and levator scap stretching  [facility] Adult PT Treatment:                                                 DATE: 08/28/2022 Therapeutic Exercise: Supine chin tuck with lift x 10 Supine horizontal abduction with red x 10 Upper trap stretch 2 x 15 sec each Double ER and scap retraction with red x 10   PATIENT EDUCATION:  Education details: HEP update Person educated: Patient Education method: Explanation, Demonstration, Tactile cues, Verbal cues, Handout Education comprehension: verbalized understanding, returned demonstration, verbal cues required, tactile cues required, and needs further education   HOME EXERCISE PROGRAM: Access Code: [email or web address]      ASSESSMENT: CLINICAL IMPRESSION: Patient tolerated therapy well with no adverse effects. Patient arrived late so therapy limited on time. Therapy continues to focus on postural control with good tolerance. She was able to progress with resistance and incorporated machine strengthening for periscapular muscles. Also progressed thoracic mobility exercises with good tolerance and updated HEP. Patient would benefit from continued skilled PT to progress her mobility and strength in order to reduce pain and maximize functional ability.    OBJECTIVE IMPAIRMENTS: decreased activity tolerance, decreased ROM, decreased strength, increased muscle spasms, impaired flexibility, postural dysfunction, and pain.    ACTIVITY LIMITATIONS: carrying, lifting, and sleeping   PARTICIPATION LIMITATIONS: meal prep, cleaning, driving, and occupation   PERSONAL FACTORS: Past/current experiences and Time since onset of injury/illness/exacerbation are also affecting patient's functional outcome.      GOALS: Goals reviewed with patient? Yes   SHORT TERM GOALS: Target date: 09/18/2022   Patient will be I with initial HEP in order to progress with therapy. Baseline: HEP provided at eval 09/22/2022: progressing Goal status: ONGOING   2.  PT will review FOTO with patient by 3rd visit in order to understand expected progress and outcome with  therapy. Baseline: FOTO assessed at eval 09/22/2022: reviewed Goal status: MET   LONG TERM GOALS: Target date: 10/09/2022   Patient will be I with final HEP to maintain progress from PT. Baseline: HEP provided at eval Goal status: INITIAL   2.  Patient will report >/= 72% status on  FOTO to indicate improved functional ability. Baseline: 55% Goal status: INITIAL   3.  Patient will report pain level with activity </= 2/10 in order to reduce functional limitations. Baseline: 6-7/10 Goal status: INITIAL   4.  Patient will demonstrate cervical rotation >/= 70 deg in order to improve turning head while driving Baseline: 60 deg Goal status: INITIAL   5. Patient will demonstrate DNF endurance >/= 20 sec and parascapular musculature strength >/= 4-/5 MMT in order to improve postural control and reduce pain with lifting. Baseline: DNF endurance 4 sec, parascapular musculature strength 4/5 MMT            Goal status: INITIAL     PLAN: PT FREQUENCY: 1x/week   PT DURATION: 6 weeks   PLANNED INTERVENTIONS: Therapeutic exercises, Therapeutic activity, Neuromuscular re-education, Balance training, Gait training, Patient/Family education, Self Care, Joint mobilization, Joint manipulation, Aquatic Therapy, Dry Needling, Electrical stimulation, Spinal manipulation, Spinal mobilization, Cryotherapy, Moist heat, Taping, Manual therapy, and Re-evaluation   PLAN FOR NEXT SESSION: Review HEP and progress PRN, manual/STM/stretching for right upper trap region (dry needling as last resort as patient scared of needles), postural control and strengthening   Hilda Blades, PT, DPT, LAT, ATC 09/22/22  5:08 PM Phone: [phone] Fax: [phone]

## 2022-09-25 ENCOUNTER — Encounter: Payer: Commercial Managed Care - HMO | Admitting: Physical Therapy

## 2022-09-29 ENCOUNTER — Encounter: Payer: Self-pay | Admitting: Physical Therapy

## 2022-09-29 ENCOUNTER — Other Ambulatory Visit: Payer: Self-pay

## 2022-09-29 ENCOUNTER — Ambulatory Visit: Payer: Commercial Managed Care - HMO | Admitting: Physical Therapy

## 2022-09-29 DIAGNOSIS — M542 Cervicalgia: Secondary | ICD-10-CM | POA: Diagnosis not present

## 2022-09-29 DIAGNOSIS — M6281 Muscle weakness (generalized): Secondary | ICD-10-CM

## 2022-09-29 NOTE — Therapy (Signed)
OUTPATIENT PHYSICAL THERAPY TREATMENT NOTE   Patient Name: Rebecca Marquez MRN: [id number] DOB:Oct 29, 1960, 62 y.o., female Today's Date: 09/29/2022  PCP: Charlott Rakes, MD REFERRING PROVIDER: Charlott Rakes, MD   END OF SESSION:   PT End of Session - 09/29/22 0937     Visit Number 4    Number of Visits 7    Date for PT Re-Evaluation 10/09/22    Authorization Type [employer]    PT Start Time 0930    PT Stop Time 1015    PT Time Calculation (min) 45 min    Activity Tolerance Patient tolerated treatment well    Behavior During Therapy [facility] for tasks assessed/performed               Past Medical History:  Diagnosis Date   Hypertension Dx March 2015   Kidney disease    Renal disorder    R/T HTN   Seasonal allergies    Past Surgical History:  Procedure Laterality Date   TUBAL LIGATION     Patient Active Problem List   Diagnosis Date Noted   Lumbar degenerative disc disease 10/07/2019   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   Transient vision disturbance of both eyes 09/25/2016   Poor dentition 02/06/2016   Glossitis 04/29/2015   HTN (hypertension) 12/11/2013   HLD (hyperlipidemia) 11/16/2013   CKD (chronic kidney disease) stage 4, GFR 15-29 ml/min ([place]) 11/16/2013    REFERRING DIAG: Neck pain   THERAPY DIAG:  Cervicalgia  Muscle weakness (generalized)  Rationale for Evaluation and Treatment Rehabilitation  PERTINENT HISTORY: None   PRECAUTIONS: None    SUBJECTIVE:       SUBJECTIVE STATEMENT:  Patient reports she is doing good. She does feel better at work with less pain.  PAIN:  Are you having pain? Yes:  NPRS scale: 2/10 (6-7/10 when pain occurs) Pain location: Neck, right Pain description: "sore muscle", burning Aggravating factors: Lifting, sleeping, can come on randomly Relieving factors: Nothing   OBJECTIVE: (objective measures completed at initial evaluation unless otherwise dated) PATIENT SURVEYS:  FOTO 55% functional status  09/29/2022: 65%    POSTURE:             Rounded shoulder, forward head   PALPATION: Tender to palpation right upper trap with increased muscle tension      CERVICAL ROM:    Active ROM A/PROM (deg) eval   09/11/2022  Flexion 65   Extension 40   Right lateral flexion 35   Left lateral flexion 25   Right rotation 60 65  Left rotation 60 60                        Patient reports right neck tightness with all cervical motion   UPPER EXTREMITY ROM:                       UE AROM grossly WFL and non-painful   UPPER EXTREMITY MMT:   MMT Right eval Left eval  Shoulder flexion 4 5  Shoulder extension 5 5  Shoulder abduction 5 5  Shoulder internal rotation 5 5  Shoulder external rotation 4 5  Middle trapezius 4- 4  Lower trapezius 4- 4  Elbow flexion 5 5  Elbow extension 5 5    FUNCTIONAL TESTS:  DNF endurance: 4 seconds     TODAY'S TREATMENT:         [facility] Adult PT Treatment:  DATE: 09/29/2022 Therapeutic Exercise: NuStep L6 x 5 min with UE/LE while taking subjective Corner pec stretch 3 x 20 sec Sidelying thoracic rotation x 10 each Upper trap stretch 2 x 15 sec each Step back stretch at counter x 5 Row machine 20# 3 x 10 Supine chin tuck with small dowel roll 2 x 10 Supine horizontal abduction with green 2 x 10 Supine serratus punch with 4# x 15 Double ER and scap retraction with green 2 x 10 Seated overhead press with 3# x 10, 2# x 10 each   [facility] Adult PT Treatment:                                                DATE: 09/22/2022 Therapeutic Exercise: UBE L3 x 4 min (2 fwd/bwd) while taking subjective Sidelying thoracic rotation x 10 each Supine thoracic extension over FR at various ranges Corner pec stretch 3 x 20 sec Upper trap stretch 2 x 15 sec each Supine horizontal abduction with green 2 x 10 Seated chin tuck 2 x 10 Double ER and scap retraction with green 2 x 10 Row machine 25# x 10, 20# x 10, 15# x 10  [facility] Adult PT  Treatment:                                                DATE: 09/11/2022 Therapeutic Exercise: UBE L1 x 4 min (2 fwd/bwd) while taking subjective Sidelying thoracic rotation x 10 each Supine horizontal abduction with red 2 x 10 Supine chin tuck 2 x 10 Upper trap stretch 2 x 15 sec each Double ER and scap retraction with red 2 x 10 Row with green 2 x 20 Extension with red 2 x 10 Manual: Suboccipital release with gentle manual traction Passive upper trap and levator scap stretching   PATIENT EDUCATION:  Education details: HEP, FOTO Person educated: Patient Education method: Explanation, Demonstration, Tactile cues, Verbal cues Education comprehension: verbalized understanding, returned demonstration, verbal cues required, tactile cues required, and needs further education   HOME EXERCISE PROGRAM: Access Code: [email or web address]      ASSESSMENT: CLINICAL IMPRESSION: Patient tolerated therapy well with no adverse effects. She reports improvement in her functional ability on FOTO this visit. Therapy focused primarily on shoulder and postural strengthening with good tolerance, but she does report she can feel it more on the right side. Incorporated some overhead lifting this visit with good tolerance but she does require initial cueing for for proper technique and scapular control. No changes to HEP this visit. Patient would benefit from continued skilled PT to progress her mobility and strength in order to reduce pain and maximize functional ability.    OBJECTIVE IMPAIRMENTS: decreased activity tolerance, decreased ROM, decreased strength, increased muscle spasms, impaired flexibility, postural dysfunction, and pain.    ACTIVITY LIMITATIONS: carrying, lifting, and sleeping   PARTICIPATION LIMITATIONS: meal prep, cleaning, driving, and occupation   PERSONAL FACTORS: Past/current experiences and Time since onset of injury/illness/exacerbation are also affecting patient's functional outcome.       GOALS: Goals reviewed with patient? Yes   SHORT TERM GOALS: Target date: 09/18/2022   Patient will be I with initial HEP in order to progress with therapy. Baseline: HEP provided at eval 09/22/2022:  progressing Goal status: ONGOING   2.  PT will review FOTO with patient by 3rd visit in order to understand expected progress and outcome with therapy. Baseline: FOTO assessed at eval 09/22/2022: reviewed Goal status: MET   LONG TERM GOALS: Target date: 10/09/2022   Patient will be I with final HEP to maintain progress from PT. Baseline: HEP provided at eval Goal status: INITIAL   2.  Patient will report >/= 72% status on FOTO to indicate improved functional ability. Baseline: 55% 09/29/2022: 65% Goal status: ONGOING   3.  Patient will report pain level with activity </= 2/10 in order to reduce functional limitations. Baseline: 6-7/10 Goal status: INITIAL   4.  Patient will demonstrate cervical rotation >/= 70 deg in order to improve turning head while driving Baseline: 60 deg Goal status: INITIAL   5. Patient will demonstrate DNF endurance >/= 20 sec and parascapular musculature strength >/= 4-/5 MMT in order to improve postural control and reduce pain with lifting. Baseline: DNF endurance 4 sec, parascapular musculature strength 4/5 MMT            Goal status: INITIAL     PLAN: PT FREQUENCY: 1x/week   PT DURATION: 6 weeks   PLANNED INTERVENTIONS: Therapeutic exercises, Therapeutic activity, Neuromuscular re-education, Balance training, Gait training, Patient/Family education, Self Care, Joint mobilization, Joint manipulation, Aquatic Therapy, Dry Needling, Electrical stimulation, Spinal manipulation, Spinal mobilization, Cryotherapy, Moist heat, Taping, Manual therapy, and Re-evaluation   PLAN FOR NEXT SESSION: Review HEP and progress PRN, manual/STM/stretching for right upper trap region (dry needling as last resort as patient scared of needles), postural control and  strengthening   Hilda Blades, PT, DPT, LAT, ATC 09/29/22  10:15 AM Phone: [phone] Fax: [phone]

## 2022-10-02 ENCOUNTER — Encounter: Payer: Commercial Managed Care - HMO | Admitting: Physical Therapy

## 2022-10-05 NOTE — Therapy (Signed)
OUTPATIENT PHYSICAL THERAPY TREATMENT NOTE   Patient Name: Rebecca Marquez MRN: [id number] DOB:1961-07-25, 62 y.o., female Today's Date: 10/06/2022  PCP: Charlott Rakes, MD REFERRING PROVIDER: Charlott Rakes, MD   END OF SESSION:   PT End of Session - 10/06/22 1012     Visit Number 5    Number of Visits 11    Date for PT Re-Evaluation 11/17/22    Authorization Type [employer]    PT Start Time 1015    PT Stop Time 1055    PT Time Calculation (min) 40 min    Activity Tolerance Patient tolerated treatment well    Behavior During Therapy [facility] for tasks assessed/performed                Past Medical History:  Diagnosis Date   Hypertension Dx March 2015   Kidney disease    Renal disorder    R/T HTN   Seasonal allergies    Past Surgical History:  Procedure Laterality Date   TUBAL LIGATION     Patient Active Problem List   Diagnosis Date Noted   Lumbar degenerative disc disease 10/07/2019   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   Transient vision disturbance of both eyes 09/25/2016   Poor dentition 02/06/2016   Glossitis 04/29/2015   HTN (hypertension) 12/11/2013   HLD (hyperlipidemia) 11/16/2013   CKD (chronic kidney disease) stage 4, GFR 15-29 ml/min ([place]) 11/16/2013    REFERRING DIAG: Neck pain   THERAPY DIAG:  Cervicalgia  Muscle weakness (generalized)  Rationale for Evaluation and Treatment Rehabilitation  PERTINENT HISTORY: None   PRECAUTIONS: None    SUBJECTIVE:       SUBJECTIVE STATEMENT:  Patient reports she is doing ok this morning. She was sore after last visit.   PAIN:  Are you having pain? Yes:  NPRS scale: 2/10 (6-7/10 when pain occurs) Pain location: Neck, right Pain description: "sore muscle", burning Aggravating factors: Lifting, sleeping, can come on randomly Relieving factors: Nothing   OBJECTIVE: (objective measures completed at initial evaluation unless otherwise dated) PATIENT SURVEYS:  FOTO 55% functional status  09/29/2022: 65%    POSTURE:             Rounded shoulder, forward head   PALPATION: Tender to palpation right upper trap with increased muscle tension      CERVICAL ROM:    Active ROM A/PROM (deg) eval   09/11/2022   10/06/2022  Flexion 65  65  Extension 40  45  Right lateral flexion 35    Left lateral flexion 25    Right rotation 60 65 70  Left rotation 60 60 65                        Patient reports right neck tightness with all cervical motion   UPPER EXTREMITY ROM:                       UE AROM grossly WFL and non-painful   UPPER EXTREMITY MMT:   MMT Right eval Left eval Rt / Lt 10/06/2022  Shoulder flexion '4 5 4 '$ / 5  Shoulder extension 5 5   Shoulder abduction 5 5   Shoulder internal rotation 5 5   Shoulder external rotation '4 5 5 '$ / 5  Middle trapezius 4- 4 4- / 5  Lower trapezius 4- 4 4- / 5  Elbow flexion 5 5   Elbow extension 5 5     FUNCTIONAL TESTS:  DNF  endurance: 4 seconds  10/06/2022: 7 seconds     TODAY'S TREATMENT:         [facility] Adult PT Treatment:                                                DATE: 10/06/2022 Therapeutic Exercise: UBE L3 x 4 min (2 fwd/bwd) while taking subjective Supine dowel shoulder flexion with 3# 2 x 10 Supine horizontal abduction with green 2 x 10 Supine serratus punch with 4# 2 x 10 Sidelying thoracic rotation x 10 each Seated double ER and scap retraction with green 2 x 10 Seated upper trap and levator scap stretch 2 x 15 sec each Step back stretch at counter x 5 Doorway pec stretch 3 x 20 sec Extension with red 2 x 10 Row machine 20# 3 x 10 Row with blue x 10   [facility] Adult PT Treatment:                                                DATE: 09/29/2022 Therapeutic Exercise: NuStep L6 x 5 min with UE/LE while taking subjective Corner pec stretch 3 x 20 sec Sidelying thoracic rotation x 10 each Upper trap stretch 2 x 15 sec each Step back stretch at counter x 5 Row machine 20# 3 x 10 Supine chin tuck with small dowel roll 2 x 10 Supine  horizontal abduction with green 2 x 10 Supine serratus punch with 4# x 15 Double ER and scap retraction with green 2 x 10 Seated overhead press with 3# x 10, 2# x 10 each  [facility] Adult PT Treatment:                                                DATE: 09/22/2022 Therapeutic Exercise: UBE L3 x 4 min (2 fwd/bwd) while taking subjective Sidelying thoracic rotation x 10 each Supine thoracic extension over FR at various ranges Corner pec stretch 3 x 20 sec Upper trap stretch 2 x 15 sec each Supine horizontal abduction with green 2 x 10 Seated chin tuck 2 x 10 Double ER and scap retraction with green 2 x 10 Row machine 25# x 10, 20# x 10, 15# x 10   PATIENT EDUCATION:  Education details: POC extension, HEP update Person educated: Patient Education method: Explanation, Demonstration, Tactile cues, Verbal cues, Handout Education comprehension: verbalized understanding, returned demonstration, verbal cues required, tactile cues required, and needs further education   HOME EXERCISE PROGRAM: Access Code: [email or web address]      ASSESSMENT: CLINICAL IMPRESSION: Patient tolerated therapy well with no adverse effects. Therapy continues to focus primarily on progressing periscapular and postural strengthening exercises with good tolerance. She demonstrates improved shoulder strength and cervical motion this visit but does continue to report some right shoulder pain with activity. She requires cueing for proper exercise technique and updated HEP to progress strengthening at home. Patient would benefit from continued skilled PT to progress her mobility and strength in order to reduce pain and maximize functional ability, so will extend PT POC.    OBJECTIVE IMPAIRMENTS: decreased activity tolerance, decreased ROM,  decreased strength, increased muscle spasms, impaired flexibility, postural dysfunction, and pain.    ACTIVITY LIMITATIONS: carrying, lifting, and sleeping   PARTICIPATION LIMITATIONS: meal prep,  cleaning, driving, and occupation   PERSONAL FACTORS: Past/current experiences and Time since onset of injury/illness/exacerbation are also affecting patient's functional outcome.      GOALS: Goals reviewed with patient? Yes   SHORT TERM GOALS: Target date: 09/18/2022   Patient will be I with initial HEP in order to progress with therapy. Baseline: HEP provided at eval 09/22/2022: progressing 10/06/2022: independent with initial HEP Goal status: MET   2.  PT will review FOTO with patient by 3rd visit in order to understand expected progress and outcome with therapy. Baseline: FOTO assessed at eval 09/22/2022: reviewed Goal status: MET   LONG TERM GOALS: Target date: 11/17/2022   Patient will be I with final HEP to maintain progress from PT. Baseline: HEP provided at eval 10/06/2022: progressing Goal status: ONGOING   2.  Patient will report >/= 72% status on FOTO to indicate improved functional ability. Baseline: 55% 09/29/2022: 65% Goal status: PARTIALLY MET   3.  Patient will report pain level with activity </= 2/10 in order to reduce functional limitations. Baseline: 6-7/10 Goal status: PARTIALLY MET   4.  Patient will demonstrate cervical rotation >/= 70 deg in order to improve turning head while driving Baseline: 60 deg 10/06/2022: limitation with left rotation Goal status: PARTIALLY MET   5. Patient will demonstrate DNF endurance >/= 20 sec and parascapular musculature strength >/= 4/5 MMT in order to improve postural control and reduce pain with lifting. Baseline: DNF endurance 4 sec, parascapular musculature strength 4-/5 MMT 10/06/2022: DNF endurance 7 sec, parascapular musculature strength 4-/5 MMT            Goal status: PARTIALLY MET     PLAN: PT FREQUENCY: 1x/week   PT DURATION: 6 weeks   PLANNED INTERVENTIONS: Therapeutic exercises, Therapeutic activity, Neuromuscular re-education, Balance training, Gait training, Patient/Family education, Self Care, Joint  mobilization, Joint manipulation, Aquatic Therapy, Dry Needling, Electrical stimulation, Spinal manipulation, Spinal mobilization, Cryotherapy, Moist heat, Taping, Manual therapy, and Re-evaluation   PLAN FOR NEXT SESSION: Review HEP and progress PRN, manual/STM/stretching for right upper trap region (dry needling as last resort as patient scared of needles), postural control and strengthening   Hilda Blades, PT, DPT, LAT, ATC 10/06/22  11:02 AM Phone: [phone] Fax: [phone]

## 2022-10-06 ENCOUNTER — Encounter: Payer: Self-pay | Admitting: Physical Therapy

## 2022-10-06 ENCOUNTER — Other Ambulatory Visit: Payer: Self-pay

## 2022-10-06 ENCOUNTER — Ambulatory Visit: Payer: Commercial Managed Care - HMO | Attending: Family Medicine | Admitting: Physical Therapy

## 2022-10-06 DIAGNOSIS — M6281 Muscle weakness (generalized): Secondary | ICD-10-CM | POA: Diagnosis present

## 2022-10-06 DIAGNOSIS — M542 Cervicalgia: Secondary | ICD-10-CM | POA: Insufficient documentation

## 2022-10-06 NOTE — Patient Instructions (Signed)
Access Code: [email or web address] URL: [email or web address] Date: 10/06/2022 Prepared by: Hilda Blades  Exercises - Sidelying Thoracic Lumbar Rotation  - 1 x daily - 10 reps - 5 seconds hold - Thoracic Extension Mobilization with Noodle  - 1 x daily - 10 reps - Supine Deep Neck Flexor Training - Repetitions  - 1 x daily - 10 reps - 5 seconds hold - Supine Shoulder Horizontal Abduction with Resistance  - 1 x daily - 20 reps - Seated Cervical Sidebending Stretch  - 1 x daily - 3 reps - 15 seconds hold - Shoulder External Rotation and Scapular Retraction with Resistance  - 1 x daily - 20 reps - Standing Row with Anchored Resistance  - 1 x daily - 2 sets - 20 reps

## 2022-10-14 ENCOUNTER — Telehealth: Payer: Self-pay | Admitting: Family Medicine

## 2022-10-14 NOTE — Telephone Encounter (Signed)
Copied from [place] [phone]. Topic: General - Other >> Oct 14, 2022  3:00 PM Oley Balm E wrote: Reason for CRM: Pt called reporting that [employer] has been contacting the office to set up pharmacy services with PCP. She is upset because the office has not responded.

## 2022-10-14 NOTE — Telephone Encounter (Signed)
Copied from [place] [phone]. Topic: General - Other >> Oct 14, 2022  3:00 PM Oley Balm E wrote: Reason for CRM: Pt called reporting that [employer] has been contacting the office to set up pharmacy services with PCP. She is upset because the office has not responded.

## 2022-10-15 NOTE — Telephone Encounter (Signed)
Patient was called and given appointment with PCP, appointment for 2/202/2024 has been canceled due to her wanting to see her PCP. She was informed that if she runs out of medication before that date she can call the pharmacy and have them to transfer the remaining refill she has to [facility].

## 2022-10-16 ENCOUNTER — Ambulatory Visit: Payer: Commercial Managed Care - HMO | Admitting: Physical Therapy

## 2022-10-16 ENCOUNTER — Encounter: Payer: Self-pay | Admitting: Physical Therapy

## 2022-10-16 DIAGNOSIS — M6281 Muscle weakness (generalized): Secondary | ICD-10-CM

## 2022-10-16 DIAGNOSIS — M542 Cervicalgia: Secondary | ICD-10-CM

## 2022-10-16 NOTE — Therapy (Addendum)
OUTPATIENT PHYSICAL THERAPY TREATMENT NOTE  DISCHARGE   Patient Name: Rebecca Marquez MRN: [id number] DOB:1960/11/04, 62 y.o., female Today's Date: 10/16/2022  PCP: Charlott Rakes, MD REFERRING PROVIDER: Charlott Rakes, MD   END OF SESSION:   PT End of Session - 10/16/22 0804     Visit Number 6    Number of Visits 11    Date for PT Re-Evaluation 11/17/22    Authorization Type [employer]    PT Start Time 0803    PT Stop Time 0841    PT Time Calculation (min) 38 min                Past Medical History:  Diagnosis Date   Hypertension Dx March 2015   Kidney disease    Renal disorder    R/T HTN   Seasonal allergies    Past Surgical History:  Procedure Laterality Date   TUBAL LIGATION     Patient Active Problem List   Diagnosis Date Noted   Lumbar degenerative disc disease 10/07/2019   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   Transient vision disturbance of both eyes 09/25/2016   Poor dentition 02/06/2016   Glossitis 04/29/2015   HTN (hypertension) 12/11/2013   HLD (hyperlipidemia) 11/16/2013   CKD (chronic kidney disease) stage 4, GFR 15-29 ml/min ([place]) 11/16/2013    REFERRING DIAG: Neck pain   THERAPY DIAG:  Cervicalgia  Muscle weakness (generalized)  Rationale for Evaluation and Treatment Rehabilitation  PERTINENT HISTORY: None   PRECAUTIONS: None    SUBJECTIVE:       SUBJECTIVE STATEMENT: Patient reports the shoulder continues to bother her at work and at home, it can hurt at rest and hurts with lifting overhead at work.  Pain in the shoulder doesn't come as often as it did. I have lower back pain too.   PAIN:  Are you having pain? No NPRS scale: 0/10 (6-7/10 when pain occurs) Pain location: Neck, right Pain description: "sore muscle", burning Aggravating factors: Lifting, sleeping, can come on randomly Relieving factors: Nothing   OBJECTIVE: (objective measures completed at initial evaluation unless otherwise dated) PATIENT SURVEYS:  FOTO 55%  functional status  09/29/2022: 65%   POSTURE:             Rounded shoulder, forward head   PALPATION: Tender to palpation right upper trap with increased muscle tension      CERVICAL ROM:    Active ROM A/PROM (deg) eval   09/11/2022   10/06/2022  Flexion 65  65  Extension 40  45  Right lateral flexion 35    Left lateral flexion 25    Right rotation 60 65 70  Left rotation 60 60 65                        Patient reports right neck tightness with all cervical motion   UPPER EXTREMITY ROM:                       UE AROM grossly WFL and non-painful   UPPER EXTREMITY MMT:   MMT Right eval Left eval Rt / Lt 10/06/2022  Shoulder flexion 4 5 4  / 5  Shoulder extension 5 5   Shoulder abduction 5 5   Shoulder internal rotation 5 5   Shoulder external rotation 4 5 5  / 5  Middle trapezius 4- 4 4- / 5  Lower trapezius 4- 4 4- / 5  Elbow flexion 5 5   Elbow extension  5 5     FUNCTIONAL TESTS:  DNF endurance: 4 seconds  10/06/2022: 7 seconds     TODAY'S TREATMENT:         [facility] Adult PT Treatment:                                                DATE: 10/16/22 Therapeutic Exercise: UBE L3 2 min each way Row Green band x 20 Ext with Green  Pec stretch in doorway  Seated ER green x 20 Standing OH press 7# x 10, 8# x 10 Standing horiz abdct x 10 Green  Supine diagonals with green band x 10 alternating  S/L right shoulder abduction 2# 10 x 2  S/L right ER 2#  Manual Therapy: STW posterior shoulder , right upper trap Self Care Instruction in use of Thera cane for self TPR   [facility] Adult PT Treatment:                                                DATE: 10/06/2022 Therapeutic Exercise: UBE L3 x 4 min (2 fwd/bwd) while taking subjective Supine dowel shoulder flexion with 3# 2 x 10 Supine horizontal abduction with green 2 x 10 Supine serratus punch with 4# 2 x 10 Sidelying thoracic rotation x 10 each Seated double ER and scap retraction with green 2 x 10 Seated upper trap and  levator scap stretch 2 x 15 sec each Step back stretch at counter x 5 Doorway pec stretch 3 x 20 sec Extension with red 2 x 10 Row machine 20# 3 x 10 Row with blue x 10   [facility] Adult PT Treatment:                                                DATE: 09/29/2022 Therapeutic Exercise: NuStep L6 x 5 min with UE/LE while taking subjective Corner pec stretch 3 x 20 sec Sidelying thoracic rotation x 10 each Upper trap stretch 2 x 15 sec each Step back stretch at counter x 5 Row machine 20# 3 x 10 Supine chin tuck with small dowel roll 2 x 10 Supine horizontal abduction with green 2 x 10 Supine serratus punch with 4# x 15 Double ER and scap retraction with green 2 x 10 Seated overhead press with 3# x 10, 2# x 10 each  [facility] Adult PT Treatment:                                                DATE: 09/22/2022 Therapeutic Exercise: UBE L3 x 4 min (2 fwd/bwd) while taking subjective Sidelying thoracic rotation x 10 each Supine thoracic extension over FR at various ranges Corner pec stretch 3 x 20 sec Upper trap stretch 2 x 15 sec each Supine horizontal abduction with green 2 x 10 Seated chin tuck 2 x 10 Double ER and scap retraction with green 2 x 10 Row machine 25# x 10, 20# x 10, 15# x 10  PATIENT EDUCATION:  Education details: POC extension, HEP update Person educated: Patient Education method: Explanation, Demonstration, Tactile cues, Verbal cues, Handout Education comprehension: verbalized understanding, returned demonstration, verbal cues required, tactile cues required, and needs further education   HOME EXERCISE PROGRAM: Access Code: [email or web address]      ASSESSMENT: CLINICAL IMPRESSION: Patient tolerated therapy well with no adverse effects. Therapy continues to focus primarily on progressing periscapular and postural strengthening exercises with good tolerance. She reports continued pain with job duties esp reaching over head. Worked on OH strengthening and S/L scap strength. TPR  performed to right upper trap. Pt shown theracane which she showed interest in purchasing one for home. She was given info on where she can purchase.  Patient would benefit from continued skilled PT to progress her mobility and strength in order to reduce pain and maximize functional ability, so will extend PT POC.    OBJECTIVE IMPAIRMENTS: decreased activity tolerance, decreased ROM, decreased strength, increased muscle spasms, impaired flexibility, postural dysfunction, and pain.    ACTIVITY LIMITATIONS: carrying, lifting, and sleeping   PARTICIPATION LIMITATIONS: meal prep, cleaning, driving, and occupation   PERSONAL FACTORS: Past/current experiences and Time since onset of injury/illness/exacerbation are also affecting patient's functional outcome.      GOALS: Goals reviewed with patient? Yes   SHORT TERM GOALS: Target date: 09/18/2022   Patient will be I with initial HEP in order to progress with therapy. Baseline: HEP provided at eval 09/22/2022: progressing 10/06/2022: independent with initial HEP Goal status: MET   2.  PT will review FOTO with patient by 3rd visit in order to understand expected progress and outcome with therapy. Baseline: FOTO assessed at eval 09/22/2022: reviewed Goal status: MET   LONG TERM GOALS: Target date: 11/17/2022   Patient will be I with final HEP to maintain progress from PT. Baseline: HEP provided at eval 10/06/2022: progressing Goal status: ONGOING   2.  Patient will report >/= 72% status on FOTO to indicate improved functional ability. Baseline: 55% 09/29/2022: 65% Goal status: PARTIALLY MET   3.  Patient will report pain level with activity </= 2/10 in order to reduce functional limitations. Baseline: 6-7/10 Goal status: PARTIALLY MET   4.  Patient will demonstrate cervical rotation >/= 70 deg in order to improve turning head while driving Baseline: 60 deg 10/06/2022: limitation with left rotation Goal status: PARTIALLY MET   5. Patient  will demonstrate DNF endurance >/= 20 sec and parascapular musculature strength >/= 4/5 MMT in order to improve postural control and reduce pain with lifting. Baseline: DNF endurance 4 sec, parascapular musculature strength 4-/5 MMT 10/06/2022: DNF endurance 7 sec, parascapular musculature strength 4-/5 MMT            Goal status: PARTIALLY MET     PLAN: PT FREQUENCY: 1x/week   PT DURATION: 6 weeks   PLANNED INTERVENTIONS: Therapeutic exercises, Therapeutic activity, Neuromuscular re-education, Balance training, Gait training, Patient/Family education, Self Care, Joint mobilization, Joint manipulation, Aquatic Therapy, Dry Needling, Electrical stimulation, Spinal manipulation, Spinal mobilization, Cryotherapy, Moist heat, Taping, Manual therapy, and Re-evaluation   PLAN FOR NEXT SESSION: Review HEP and progress PRN, manual/STM/stretching for right upper trap region (dry needling as last resort as patient scared of needles), postural control and strengthening, did she order theracane?   Hessie Diener, PTA 10/16/22 11:03 AM Phone: [phone] Fax: [phone]    PHYSICAL THERAPY DISCHARGE SUMMARY  Visits from Start of Care: 6  Current functional level related to goals / functional outcomes: See above   Remaining  deficits: See above   Education / Equipment: HEP   Patient agrees to discharge. Patient goals were not met. Patient is being discharged due to not returning since the last visit.  Hilda Blades, PT, DPT, LAT, ATC 12/03/22  1:02 PM Phone: [phone] Fax: [phone]

## 2022-10-20 ENCOUNTER — Other Ambulatory Visit: Payer: Self-pay

## 2022-10-20 MED ORDER — OSELTAMIVIR PHOSPHATE 75 MG PO CAPS
75.0000 mg | ORAL_CAPSULE | Freq: Two times a day (BID) | ORAL | 0 refills | Status: DC
  Filled 2022-10-20: qty 10, 5d supply, fill #0

## 2022-10-20 MED ORDER — PROMETHAZINE-DM 6.25-15 MG/5ML PO SYRP
5.0000 mL | ORAL_SOLUTION | Freq: Four times a day (QID) | ORAL | 0 refills | Status: DC | PRN
  Filled 2022-10-20: qty 120, 6d supply, fill #0

## 2022-10-21 ENCOUNTER — Ambulatory Visit: Payer: Commercial Managed Care - HMO | Admitting: Physician Assistant

## 2022-10-23 ENCOUNTER — Ambulatory Visit: Payer: Commercial Managed Care - HMO | Admitting: Physical Therapy

## 2022-10-26 ENCOUNTER — Emergency Department (HOSPITAL_BASED_OUTPATIENT_CLINIC_OR_DEPARTMENT_OTHER): Payer: Commercial Managed Care - HMO

## 2022-10-26 ENCOUNTER — Emergency Department (HOSPITAL_BASED_OUTPATIENT_CLINIC_OR_DEPARTMENT_OTHER)
Admission: EM | Admit: 2022-10-26 | Discharge: 2022-10-26 | Disposition: A | Discharge status: Home / Self Care | Attending: Emergency Medicine | Admitting: Emergency Medicine

## 2022-10-26 ENCOUNTER — Encounter (HOSPITAL_BASED_OUTPATIENT_CLINIC_OR_DEPARTMENT_OTHER): Payer: Self-pay | Admitting: Urology

## 2022-10-26 DIAGNOSIS — S6991XA Unspecified injury of right wrist, hand and finger(s), initial encounter: Secondary | ICD-10-CM | POA: Diagnosis present

## 2022-10-26 DIAGNOSIS — S60151A Contusion of right little finger with damage to nail, initial encounter: Secondary | ICD-10-CM | POA: Diagnosis not present

## 2022-10-26 DIAGNOSIS — W230XXA Caught, crushed, jammed, or pinched between moving objects, initial encounter: Secondary | ICD-10-CM | POA: Diagnosis not present

## 2022-10-26 DIAGNOSIS — S62639A Displaced fracture of distal phalanx of unspecified finger, initial encounter for closed fracture: Secondary | ICD-10-CM

## 2022-10-26 DIAGNOSIS — Y990 Civilian activity done for income or pay: Secondary | ICD-10-CM | POA: Insufficient documentation

## 2022-10-26 DIAGNOSIS — S62666A Nondisplaced fracture of distal phalanx of right little finger, initial encounter for closed fracture: Secondary | ICD-10-CM | POA: Insufficient documentation

## 2022-10-26 NOTE — Discharge Instructions (Addendum)
You were seen in the emergency department today for evaluation of your finger pain and swelling.  It appears that the end of your pinky is broken.  You been placed in a splint.  Please leave this on until you are assessed by someone with hand surgery.  I know that she see Dr. [name], I have included his information into the discharge paperwork.  Called schedule an appointment.  Take Tylenol 1000 mg every 6 hours as needed for pain.  If you have any concerns, new or worsening symptoms, please return to the nearest emergency room for evaluation  Contact a doctor if: Your pain or swelling gets worse, even with treatment. You have trouble moving your finger. Get help right away if: Your finger gets numb or turns blue.

## 2022-10-26 NOTE — ED Provider Notes (Signed)
[facility] Provider Note   CSN: [id number] Arrival date & time: 10/26/22  1109     History Chief Complaint  Patient presents with   Finger Injury    Rebecca Marquez is Marquez 62 y.o. female presents to the ER today for evaluation of right little finger pain.  Earlier today, patient reports that she slammed it in [employer] piece of equipment.  She is right-hand dominant.  Denies any numbness or tingling.  HPI     Home Medications Prior to Admission medications   Medication Sig Start Date End Date Taking? Authorizing Provider  acetaminophen-codeine (TYLENOL #3) 300-30 MG tablet Take 1 tablet by mouth every 6 (six) hours. 07/29/22     amLODipine (NORVASC) 10 MG tablet Take 1 tablet (10 mg total) by mouth daily. 07/17/22   Charlott Rakes, MD  atorvastatin (LIPITOR) 20 MG tablet Take 1 tablet (20 mg total) by mouth daily. 04/29/22   Charlott Rakes, MD  cloNIDine (CATAPRES) 0.1 MG tablet Take 1 tablet (0.1 mg total) by mouth 3 (three) times daily. 07/27/22   Charlott Rakes, MD  cyclobenzaprine (FLEXERIL) 5 MG tablet Take 1 tablet (5 mg total) by mouth at bedtime. Patient not taking: Reported on 08/28/2022 07/27/22   Charlott Rakes, MD  methylPREDNISolone (MEDROL) 4 MG TBPK tablet Take 1 packet (oral) as directed Patient not taking: Reported on 08/28/2022 08/10/22     ondansetron (ZOFRAN-ODT) 4 MG disintegrating tablet Take 1 tablet (4 mg total) by mouth every 8 (eight) hours as needed for nausea or vomiting. 07/03/22   Francene Finders, PA-C  oseltamivir (TAMIFLU) 75 MG capsule Take 1 capsule (75 mg total) by mouth 2 (two) times daily. 10/20/22     promethazine-dextromethorphan (PROMETHAZINE-DM) 6.25-15 MG/5ML syrup Take 5 mLs by mouth 4 (four) times daily as needed for cough. 10/20/22     cetirizine (ZYRTEC) 5 MG tablet Take 1 tablet (5 mg total) by mouth daily. 06/27/18 02/27/19  Rebecca Halt A, NP  fluticasone (FLONASE) 50 MCG/ACT nasal spray Place 1  spray into both nostrils daily. 10/21/18 02/27/19  Rebecca Everts, MD      Allergies    Nsaids and Losartan    Review of Systems   Review of Systems  Musculoskeletal:  Positive for arthralgias.    Physical Exam Updated Vital Signs BP (!) 156/110 (BP Location: Left Arm)   Pulse 83   Temp 97.6 F (36.4 C) (Oral)   Resp 16   Ht '5\' 3"'$  (1.6 m)   Wt 49.9 kg   SpO2 96%   BMI 19.49 kg/m  Physical Exam Vitals and nursing note reviewed.  Constitutional:      General: She is not in acute distress.    Appearance: Normal appearance. She is not toxic-appearing.  Eyes:     General: No scleral icterus. Pulmonary:     Effort: Pulmonary effort is normal. No respiratory distress.  Musculoskeletal:        General: Tenderness and signs of injury present.     Comments: Mild bruising noted to the proximal nail bed. Nail is intact. No bleeding noted. No extreme nail laxity noted. Full ROM with some pain. Sensation intact. Cap refill brisk. Compartments are soft. Palpable pulses. No overlying laceration or abrasion  Skin:    General: Skin is dry.     Findings: No rash.  Neurological:     General: No focal deficit present.     Mental Status: She is alert. Mental status is  at baseline.  Psychiatric:        Mood and Affect: Mood normal.     ED Results / Procedures / Treatments   Labs (all labs ordered are listed, but only abnormal results are displayed) Labs Reviewed - No data to display  EKG None  Radiology DG Finger Little Right  Result Date: 10/26/2022 CLINICAL DATA:  62 year old female status post 5th finger injury, slammed in door this morning. Pain. EXAM: RIGHT LITTLE FINGER 2+V COMPARISON:  None Available. FINDINGS: Transverse nondisplaced fracture through the midshaft of the right 5th distal phalanx. Extra-articular fracture. Tuft appears intact. Some regional soft tissue swelling. Fifth ray joint spaces and alignment maintained. IMPRESSION: Transverse nondisplaced fracture  through the midshaft of the right 5th distal phalanx. Electronically Signed   By: [name] M.D.   On: 10/26/2022 11:37    Procedures Procedures   Medications Ordered in ED Medications - No data to display  ED Course/ Medical Decision Making/ Marquez&P                           Medical Decision Making Amount and/or Complexity of Data Reviewed Radiology: ordered.   62 year old female presents emerged from today for evaluation of right fifth finger pain after slamming it in Marquez piece of equipment.  Differential diagnosis includes was not limited to tendon or ligament injury, contusion, bruising, dislocation, fracture.  Vital signs show elevated blood pressure otherwise unremarkable.  Physical exam as noted above.  X-ray imaging ordered in triage.  X-ray imaging shows transverse nondisplaced fracture through the midshaft of the right fifth distal phalanx.  The patient is neurovascularly intact.  She does have some pain with movement.  Temperature and coloration appear symmetric to the surrounding hand.  Cap refills brisk.  For this, patient will need splinting.  She reports that she already has [facility] Copy, Dr. [name].  She would like to follow-up with him for this.  I think this is reasonable.  Recommended Tylenol as needed for pain given her CKD and cannot take any ibuprofen.  We discussed the RICE method.  Discussed return precautions red flag symptoms.  Patient verbalized understanding agrees the plan.  Patient is stable being discharged home in good condition.   Final Clinical Impression(s) / ED Diagnoses Final diagnoses:  Closed fracture of tuft of distal phalanx of finger    Rx / DC Orders ED Discharge Orders     None         [name], PA-C 10/26/22 1246    [name], MD 10/26/22 [phone]

## 2022-10-26 NOTE — ED Triage Notes (Signed)
Right pinky finger injury, slammed in door while at work this am Mild swelling noted

## 2022-10-27 ENCOUNTER — Ambulatory Visit: Payer: Self-pay | Admitting: Family Medicine

## 2022-10-30 ENCOUNTER — Ambulatory Visit: Payer: Commercial Managed Care - HMO | Admitting: Physical Therapy

## 2022-11-05 ENCOUNTER — Ambulatory Visit: Payer: Commercial Managed Care - HMO | Attending: Family Medicine | Admitting: Family Medicine

## 2022-11-05 ENCOUNTER — Other Ambulatory Visit: Payer: Self-pay

## 2022-11-05 ENCOUNTER — Encounter: Payer: Self-pay | Admitting: Family Medicine

## 2022-11-05 VITALS — BP 131/88 | HR 77 | Ht 63.0 in | Wt 110.4 lb

## 2022-11-05 DIAGNOSIS — I129 Hypertensive chronic kidney disease with stage 1 through stage 4 chronic kidney disease, or unspecified chronic kidney disease: Secondary | ICD-10-CM | POA: Diagnosis not present

## 2022-11-05 DIAGNOSIS — S62606D Fracture of unspecified phalanx of right little finger, subsequent encounter for fracture with routine healing: Secondary | ICD-10-CM

## 2022-11-05 DIAGNOSIS — Z131 Encounter for screening for diabetes mellitus: Secondary | ICD-10-CM | POA: Diagnosis not present

## 2022-11-05 DIAGNOSIS — E782 Mixed hyperlipidemia: Secondary | ICD-10-CM | POA: Diagnosis not present

## 2022-11-05 DIAGNOSIS — R202 Paresthesia of skin: Secondary | ICD-10-CM | POA: Diagnosis not present

## 2022-11-05 DIAGNOSIS — Z1211 Encounter for screening for malignant neoplasm of colon: Secondary | ICD-10-CM

## 2022-11-05 DIAGNOSIS — N184 Chronic kidney disease, stage 4 (severe): Secondary | ICD-10-CM

## 2022-11-05 LAB — POCT GLYCOSYLATED HEMOGLOBIN (HGB A1C): Hemoglobin A1C: 5.8 % — AB (ref 4.0–5.6)

## 2022-11-05 MED ORDER — CLONIDINE HCL 0.1 MG PO TABS: 0.1000 mg | ORAL_TABLET | Freq: Three times a day (TID) | ORAL | 1 refills | Status: DC

## 2022-11-05 MED ORDER — TRAMADOL HCL 50 MG PO TABS
50.0000 mg | ORAL_TABLET | Freq: Two times a day (BID) | ORAL | 0 refills | Status: AC | PRN
  Filled 2022-11-05: qty 30, 15d supply, fill #0

## 2022-11-05 MED ORDER — GABAPENTIN 100 MG PO CAPS: 100.0000 mg | ORAL_CAPSULE | Freq: Every day | ORAL | 1 refills | Status: DC

## 2022-11-05 MED ORDER — AMLODIPINE BESYLATE 10 MG PO TABS: 10.0000 mg | ORAL_TABLET | Freq: Every day | ORAL | 1 refills | Status: DC

## 2022-11-05 MED ORDER — ATORVASTATIN CALCIUM 20 MG PO TABS: 20.0000 mg | ORAL_TABLET | Freq: Every day | ORAL | 1 refills | Status: DC

## 2022-11-05 NOTE — Progress Notes (Signed)
Patient is needing all medications sent to [facility].

## 2022-11-05 NOTE — Patient Instructions (Addendum)

## 2022-11-05 NOTE — Progress Notes (Signed)
Subjective:  Patient ID: Rebecca Marquez, female    DOB: [birth year]  Age: 62 y.o. MRN: [id number]  CC: Hypertension   HPI Rebecca Marquez is a 62 y.o. year old female with a history of hypertension, hyperlipidemia stage IV chronic kidney disease (followed by Nephrology)     Interval History: She had a closed fracture of her right fifth finger and is currently under Ortho care. She is using Tylenol with no much relief. She uses THC but no cocaine.  Nephrology appointment comes up next month.  Endorses adherence with her antihypertensive and statin.  She complains of paresthesia in her feet and has no known history of diabetes.  She drinks alcohol socially.  Symptoms have been present for years. Past Medical History:  Diagnosis Date   Hypertension Dx March 2015   Kidney disease    Renal disorder    R/T HTN   Seasonal allergies     Past Surgical History:  Procedure Laterality Date   TUBAL LIGATION      Family History  Problem Relation Age of Onset   Diabetes Mother    Arthritis Mother    Multiple sclerosis Daughter 63   Colon cancer Neg Hx    Colon polyps Neg Hx    Esophageal cancer Neg Hx    Stomach cancer Neg Hx    Rectal cancer Neg Hx     Social History   Socioeconomic History   Marital status: Legally Separated    Spouse name: Not on file   Number of children: Not on file   Years of education: Not on file   Highest education level: Not on file  Occupational History   Not on file  Tobacco Use   Smoking status: Former    Packs/day: 0.50    Years: 20.00    Total pack years: 10.00    Types: Cigarettes    Quit date: 08/31/2001    Years since quitting: 21.1   Smokeless tobacco: Never  Vaping Use   Vaping Use: Never used  Substance and Sexual Activity   Alcohol use: Yes    Alcohol/week: 1.0 standard drink of alcohol    Types: 1 Cans of beer per week    Comment: occasional   Drug use: Yes    Types: Marijuana    Comment: once a day   Sexual activity:  Not on file  Other Topics Concern   Not on file  Social History Narrative   Not on file   Social Determinants of Health   Financial Resource Strain: Not on file  Food Insecurity: Not on file  Transportation Needs: Not on file  Physical Activity: Not on file  Stress: Not on file  Social Connections: Not on file    Allergies  Allergen Reactions   Nsaids Other (See Comments)    ckd   Losartan Tinitus    Outpatient Medications Prior to Visit  Medication Sig Dispense Refill   cyclobenzaprine (FLEXERIL) 5 MG tablet Take 1 tablet (5 mg total) by mouth at bedtime. 30 tablet 1   ondansetron (ZOFRAN-ODT) 4 MG disintegrating tablet Take 1 tablet (4 mg total) by mouth every 8 (eight) hours as needed for nausea or vomiting. 20 tablet 0   amLODipine (NORVASC) 10 MG tablet Take 1 tablet (10 mg total) by mouth daily. 90 tablet 1   atorvastatin (LIPITOR) 20 MG tablet Take 1 tablet (20 mg total) by mouth daily. 30 tablet 3   cloNIDine (CATAPRES) 0.1 MG tablet Take 1 tablet (  0.1 mg total) by mouth 3 (three) times daily. 270 tablet 1   acetaminophen-codeine (TYLENOL #3) 300-30 MG tablet Take 1 tablet by mouth every 6 (six) hours. 12 tablet 0   methylPREDNISolone (MEDROL) 4 MG TBPK tablet Take 1 packet (oral) as directed 21 each 0   oseltamivir (TAMIFLU) 75 MG capsule Take 1 capsule (75 mg total) by mouth 2 (two) times daily. (Patient not taking: Reported on 11/05/2022) 10 capsule 0   promethazine-dextromethorphan (PROMETHAZINE-DM) 6.25-15 MG/5ML syrup Take 5 mLs by mouth 4 (four) times daily as needed for cough. (Patient not taking: Reported on 11/05/2022) 120 mL 0   No facility-administered medications prior to visit.     ROS Review of Systems  Constitutional:  Negative for activity change and appetite change.  HENT:  Negative for sinus pressure and sore throat.   Respiratory:  Negative for chest tightness, shortness of breath and wheezing.   Cardiovascular:  Negative for chest pain and  palpitations.  Gastrointestinal:  Negative for abdominal distention, abdominal pain and constipation.  Genitourinary: Negative.   Musculoskeletal: Negative.   Neurological:  Positive for numbness.  Psychiatric/Behavioral:  Negative for behavioral problems and dysphoric mood.     Objective:  BP 131/88   Pulse 77   Ht '5\' 3"'$  (1.6 m)   Wt 110 lb 6.4 oz (50.1 kg)   SpO2 95%   BMI 19.56 kg/m      11/05/2022   10:04 AM 10/26/2022   11:16 AM 10/26/2022   11:14 AM  BP/Weight  Systolic BP A999333 A999333   Diastolic BP 88 A999333   Wt. (Lbs) 110.4  110  BMI 19.56 kg/m2  19.49 kg/m2      Physical Exam Constitutional:      Appearance: She is well-developed.  Cardiovascular:     Rate and Rhythm: Normal rate.     Heart sounds: Normal heart sounds. No murmur heard. Pulmonary:     Effort: Pulmonary effort is normal.     Breath sounds: Normal breath sounds. No wheezing or rales.  Chest:     Chest wall: No tenderness.  Abdominal:     General: Bowel sounds are normal. There is no distension.     Palpations: Abdomen is soft. There is no mass.     Tenderness: There is no abdominal tenderness.  Musculoskeletal:     Right lower leg: No edema.     Left lower leg: No edema.     Comments: Right little finger in a splint  Neurological:     Mental Status: She is alert and oriented to person, place, and time.  Psychiatric:        Mood and Affect: Mood normal.        Latest Ref Rng & Units 08/03/2022    8:41 AM 04/28/2022    9:21 AM 10/28/2021    9:40 AM  CMP  Glucose 70 - 99 mg/dL 91  87  72   BUN 6 - 24 mg/dL 28  37  36   Creatinine 0.57 - 1.00 mg/dL 2.23  2.46  2.06   Sodium 134 - 144 mmol/L 142  140  139   Potassium 3.5 - 5.2 mmol/L 4.3  4.5  4.2   Chloride 96 - 106 mmol/L 105  103  105   CO2 20 - 29 mmol/L '22  19  20   '$ Calcium 8.7 - 10.2 mg/dL 9.1  9.5  9.6   Total Protein 6.0 - 8.5 g/dL 6.9   6.9   Total  Bilirubin 0.0 - 1.2 mg/dL <0.2   0.3   Alkaline Phos 44 - 121 IU/L 112   128   AST  0 - 40 IU/L 26   26   ALT 0 - 32 IU/L 19   17     Lipid Panel     Component Value Date/Time   CHOL 180 08/03/2022 0841   TRIG 103 08/03/2022 0841   HDL 66 08/03/2022 0841   CHOLHDL 3.2 01/03/2020 1055   CHOLHDL 4.0 09/25/2016 0949   VLDL 17 09/25/2016 0949   LDLCALC 96 08/03/2022 0841    CBC    Component Value Date/Time   WBC 4.0 05/08/2020 1000   WBC 5.0 01/19/2017 2118   RBC 4.38 05/08/2020 1000   RBC 4.41 01/19/2017 2118   HGB 15.0 05/08/2020 1000   HCT 43.3 05/08/2020 1000   PLT 222 05/08/2020 1000   MCV 99 (H) 05/08/2020 1000   MCH 34.2 (H) 05/08/2020 1000   MCH 31.5 01/19/2017 2118   MCHC 34.6 05/08/2020 1000   MCHC 33.4 01/19/2017 2118   RDW 14.0 05/08/2020 1000   LYMPHSABS 2.0 01/19/2017 2118   MONOABS 0.3 01/19/2017 2118   EOSABS 0.3 01/19/2017 2118   BASOSABS 0.1 01/19/2017 2118    Lab Results  Component Value Date   HGBA1C 5.8 (A) 11/05/2022    Assessment & Plan:  1. Benign hypertension with chronic kidney disease, stage IV (HCC) Controlled Continue current regimen She does have hypertensive nephropathy Continue to follow-up with nephrology Counseled on blood pressure goal of less than 130/80, low-sodium, DASH diet, medication compliance, 150 minutes of moderate intensity exercise per week. Discussed medication compliance, adverse effects. - amLODipine (NORVASC) 10 MG tablet; Take 1 tablet (10 mg total) by mouth daily.  Dispense: 90 tablet; Refill: 1 - cloNIDine (CATAPRES) 0.1 MG tablet; Take 1 tablet (0.1 mg total) by mouth 3 (three) times daily.  Dispense: 270 tablet; Refill: 1  2. Paresthesia Screen for diabetes is negative but reveals presence of prediabetes with A1c of 5.8 Alcohol neuropathy less likely since she only drinks socially Will place on gabapentin-we have discussed adverse effects - gabapentin (NEURONTIN) 100 MG capsule; Take 1 capsule (100 mg total) by mouth at bedtime.  Dispense: 90 capsule; Refill: 1  3. Screening for colon  cancer - Ambulatory referral to Gastroenterology  4. Closed nondisplaced fracture of phalanx of right little finger with routine healing, unspecified phalanx, subsequent encounter Currently wearing a splint Follow-up with Ortho - traMADol (ULTRAM) 50 MG tablet; Take 1 tablet (50 mg total) by mouth every 12 (twelve) hours as needed.  Dispense: 30 tablet; Refill: 0  5. Screening for diabetes mellitus (DM) - POCT glycosylated hemoglobin (Hb A1C)  6. Mixed hyperlipidemia Controlled Low-cholesterol diet - atorvastatin (LIPITOR) 20 MG tablet; Take 1 tablet (20 mg total) by mouth daily.  Dispense: 90 tablet; Refill: 1    Meds ordered this encounter  Medications   traMADol (ULTRAM) 50 MG tablet    Sig: Take 1 tablet (50 mg total) by mouth every 12 (twelve) hours as needed.    Dispense:  30 tablet    Refill:  0   amLODipine (NORVASC) 10 MG tablet    Sig: Take 1 tablet (10 mg total) by mouth daily.    Dispense:  90 tablet    Refill:  1   atorvastatin (LIPITOR) 20 MG tablet    Sig: Take 1 tablet (20 mg total) by mouth daily.    Dispense:  90 tablet  Refill:  1   cloNIDine (CATAPRES) 0.1 MG tablet    Sig: Take 1 tablet (0.1 mg total) by mouth 3 (three) times daily.    Dispense:  270 tablet    Refill:  1   gabapentin (NEURONTIN) 100 MG capsule    Sig: Take 1 capsule (100 mg total) by mouth at bedtime.    Dispense:  90 capsule    Refill:  1    Follow-up: Return in about 6 months (around 05/08/2023) for Chronic medical conditions.       Charlott Rakes, MD, FAAFP. [facility] [address] [place], [place]   11/05/2022, 11:51 AM

## 2022-11-06 ENCOUNTER — Ambulatory Visit: Payer: Commercial Managed Care - HMO | Admitting: Physical Therapy

## 2022-12-05 LAB — LAB REPORT - SCANNED
Creatinine, POC: 71 mg/dL
EGFR: 27
Protein/Creatinine Ratio: 1044

## 2022-12-09 ENCOUNTER — Encounter: Payer: Self-pay | Admitting: Gastroenterology

## 2023-01-06 ENCOUNTER — Encounter: Payer: Self-pay | Admitting: Family Medicine

## 2023-01-22 ENCOUNTER — Encounter (HOSPITAL_BASED_OUTPATIENT_CLINIC_OR_DEPARTMENT_OTHER): Payer: Self-pay | Admitting: Orthopedic Surgery

## 2023-01-22 ENCOUNTER — Other Ambulatory Visit: Payer: Self-pay

## 2023-01-22 ENCOUNTER — Encounter (HOSPITAL_BASED_OUTPATIENT_CLINIC_OR_DEPARTMENT_OTHER)
Admission: RE | Admit: 2023-01-22 | Discharge: 2023-01-22 | Disposition: A | Discharge status: Home / Self Care | Payer: Commercial Managed Care - HMO | Source: Ambulatory Visit | Attending: Orthopedic Surgery | Admitting: Orthopedic Surgery

## 2023-01-22 DIAGNOSIS — Z01812 Encounter for preprocedural laboratory examination: Secondary | ICD-10-CM | POA: Diagnosis present

## 2023-01-22 DIAGNOSIS — I10 Essential (primary) hypertension: Secondary | ICD-10-CM | POA: Insufficient documentation

## 2023-01-22 LAB — BASIC METABOLIC PANEL
Anion gap: 11 (ref 5–15)
BUN: 31 mg/dL — ABNORMAL HIGH (ref 6–20)
CO2: 23 mmol/L (ref 22–32)
Calcium: 9 mg/dL (ref 8.9–10.3)
Chloride: 104 mmol/L (ref 98–111)
Creatinine, Ser: 2.31 mg/dL — ABNORMAL HIGH (ref 0.44–1.00)
GFR, Estimated: 24 mL/min — ABNORMAL LOW (ref >60)
Glucose, Bld: 85 mg/dL (ref 70–99)
Potassium: 4.9 mmol/L (ref 3.5–5.1)
Sodium: 138 mmol/L (ref 135–145)

## 2023-02-02 ENCOUNTER — Ambulatory Visit (HOSPITAL_BASED_OUTPATIENT_CLINIC_OR_DEPARTMENT_OTHER)
Admission: RE | Admit: 2023-02-02 | Payer: Commercial Managed Care - HMO | Source: Home / Self Care | Admitting: Orthopedic Surgery

## 2023-02-02 DIAGNOSIS — I10 Essential (primary) hypertension: Secondary | ICD-10-CM

## 2023-02-02 DIAGNOSIS — Z01818 Encounter for other preprocedural examination: Secondary | ICD-10-CM

## 2023-02-02 DIAGNOSIS — N184 Chronic kidney disease, stage 4 (severe): Secondary | ICD-10-CM

## 2023-02-02 SURGERY — MINOR NAILBED REPAIR
Anesthesia: Monitor Anesthesia Care | Laterality: Right

## 2023-03-18 ENCOUNTER — Other Ambulatory Visit: Payer: Self-pay

## 2023-03-19 ENCOUNTER — Other Ambulatory Visit: Payer: Self-pay

## 2023-03-19 MED ORDER — CLONIDINE 0.1 MG/24HR TD PTWK
0.1000 mg | MEDICATED_PATCH | TRANSDERMAL | 3 refills | Status: DC
  Filled 2023-03-19: qty 4, 28d supply, fill #0

## 2023-03-22 ENCOUNTER — Other Ambulatory Visit: Payer: Self-pay

## 2023-03-24 ENCOUNTER — Other Ambulatory Visit: Payer: Self-pay

## 2023-03-24 MED ORDER — CLONIDINE 0.2 MG/24HR TD PTWK
0.2000 mg | MEDICATED_PATCH | TRANSDERMAL | 3 refills | Status: DC
  Filled 2023-03-24: qty 4, 28d supply, fill #0

## 2023-03-26 ENCOUNTER — Other Ambulatory Visit: Payer: Self-pay

## 2023-05-05 ENCOUNTER — Other Ambulatory Visit: Payer: Self-pay | Admitting: Family Medicine

## 2023-05-05 DIAGNOSIS — I129 Hypertensive chronic kidney disease with stage 1 through stage 4 chronic kidney disease, or unspecified chronic kidney disease: Secondary | ICD-10-CM

## 2023-05-05 NOTE — Telephone Encounter (Signed)
Medication Refill - Medication: amLODipine (NORVASC) 10 MG tablet and cloNIDine (CATAPRES - DOSED IN MG/24 HR) 0.2 mg/24hr patch   Has the patient contacted their pharmacy? No.  Preferred Pharmacy (with phone number or street name):  [facility] - [place], [place] - [address] Phone: [phone]  Fax: [phone]     Has the patient been seen for an appointment in the last year OR does the patient have an upcoming appointment? Yes.    Agent: Please be advised that RX refills may take up to 3 business days. We ask that you follow-up with your pharmacy.

## 2023-05-06 MED ORDER — AMLODIPINE BESYLATE 10 MG PO TABS
10.0000 mg | ORAL_TABLET | Freq: Every day | ORAL | 0 refills | Status: DC
Start: 2023-05-06 — End: 2023-07-13

## 2023-05-06 MED ORDER — CLONIDINE 0.2 MG/24HR TD PTWK: 0.2000 mg | MEDICATED_PATCH | TRANSDERMAL | 2 refills | Status: DC

## 2023-05-06 NOTE — Telephone Encounter (Signed)
Change of pharmacy Requested Prescriptions  Pending Prescriptions Disp Refills   amLODipine (NORVASC) 10 MG tablet 90 tablet 0    Sig: Take 1 tablet (10 mg total) by mouth daily.     Cardiovascular: Calcium Channel Blockers 2 Failed - 05/05/2023  4:20 PM      Failed - Valid encounter within last 6 months    Recent Outpatient Visits           6 months ago Paresthesia   [place] [facility] Hoy Register, MD   9 months ago Neck pain   [place] [facility] Hoy Register, MD   1 year ago Hallux valgus of left foot   [place] Hoy Register, MD   1 year ago Benign hypertension with chronic kidney disease, stage IV [facility])   [place] [facility] Hoy Register, MD   2 years ago Annual physical exam   [place] [facility] Hoy Register, MD       Future Appointments             In 4 days Hoy Register, MD [facility]            Passed - Last BP in normal range    BP Readings from Last 1 Encounters:  11/05/22 131/88         Passed - Last Heart Rate in normal range    Pulse Readings from Last 1 Encounters:  11/05/22 77          cloNIDine (CATAPRES - DOSED IN MG/24 HR) 0.2 mg/24hr patch 16 patch 2    Sig: Place 1 patch (0.2 mg total) onto the skin once a week. Take clonidine 0.1mg  3 times a day on the first day that you apply the patch. (First application only)     Cardiovascular:  Alpha-2 Agonists Failed - 05/05/2023  4:20 PM      Failed - Valid encounter within last 6 months    Recent Outpatient Visits           6 months ago Paresthesia   [place] [facility] Hoy Register, MD   9 months ago Neck pain   [place] [facility] Hoy Register, MD   1 year ago Hallux valgus of left foot   [facility]  Hoy Register, MD   1 year ago Benign hypertension with chronic kidney disease, stage IV [facility])   [place] [facility] Hoy Register, MD   2 years ago Annual physical exam    [facility] Hoy Register, MD       Future Appointments             In 4 days Hoy Register, MD [facility]            Passed - Last BP in normal range    BP Readings from Last 1 Encounters:  11/05/22 131/88         Passed - Last Heart Rate in normal range    Pulse Readings from Last 1 Encounters:  11/05/22 77

## 2023-05-10 ENCOUNTER — Ambulatory Visit: Payer: Commercial Managed Care - HMO | Admitting: Family Medicine

## 2023-05-10 DIAGNOSIS — E782 Mixed hyperlipidemia: Secondary | ICD-10-CM

## 2023-05-10 DIAGNOSIS — I129 Hypertensive chronic kidney disease with stage 1 through stage 4 chronic kidney disease, or unspecified chronic kidney disease: Secondary | ICD-10-CM

## 2023-05-17 ENCOUNTER — Ambulatory Visit: Payer: Self-pay

## 2023-05-17 ENCOUNTER — Other Ambulatory Visit: Payer: Self-pay

## 2023-05-17 DIAGNOSIS — I129 Hypertensive chronic kidney disease with stage 1 through stage 4 chronic kidney disease, or unspecified chronic kidney disease: Secondary | ICD-10-CM

## 2023-05-17 MED ORDER — CLONIDINE HCL 0.1 MG PO TABS
0.1000 mg | ORAL_TABLET | Freq: Three times a day (TID) | ORAL | 0 refills | Status: DC
  Filled 2023-05-17: qty 90, 30d supply, fill #0

## 2023-05-17 NOTE — Telephone Encounter (Addendum)
  Chief Complaint: BP elevated and out of Clonidine Symptoms: fatigue, sighing more Frequency: was sent patches of Clonidine and pt stated patches do not work- pt sated that she took her last 2 po clonidine yesterday Pertinent Negatives: Patient denies blurred vision, headache Disposition: [] ED /[] Urgent Care (no appt availability in office) / [] Appointment(In office/virtual)/ []  [facility]/ [] Home Care/ [] Refused Recommended Disposition /[] [facility]/ [x]  Follow-up with PCP Additional Notes: next opening tomorrow- send message to Cassandra if pt can be worked in today. Advised pt to bring BP machine with her. Reordered clonidine per pt request. Advised that med dose might be changed pt stated she had "Medicaid and its free anyway" Reason for Disposition  [1] Systolic BP  >= 200 OR Diastolic >= 120 AND [2] having NO cardiac or neurologic symptoms  Answer Assessment - Initial Assessment Questions 1. BLOOD PRESSURE: "What is the blood pressure?" "Did you take at least two measurements 5 minutes apart?"     1554/102  and 167/100 2. ONSET: "When did you take your blood pressure?"     This am  3. HOW: "How did you take your blood pressure?" (e.g., automatic home BP monitor, visiting nurse)     Automatic BP 4. HISTORY: "Do you have a history of high blood pressure?"     yes 5. MEDICINES: "Are you taking any medicines for blood pressure?" "Have you missed any doses recently?"     Yes missed clonidine 6. OTHER SYMPTOMS: "Do you have any symptoms?" (e.g., blurred vision, chest pain, difficulty breathing, headache, weakness)     Sighing a lot, fatigue  Protocols used: Blood Pressure - High-A-AH

## 2023-05-18 ENCOUNTER — Other Ambulatory Visit: Payer: Self-pay | Admitting: Family Medicine

## 2023-05-18 DIAGNOSIS — I129 Hypertensive chronic kidney disease with stage 1 through stage 4 chronic kidney disease, or unspecified chronic kidney disease: Secondary | ICD-10-CM

## 2023-05-18 NOTE — Telephone Encounter (Signed)
Medication Refill - Medication: cloNIDine (CATAPRES) 0.1 MG tablet  Pt received the patches instead of the ppills  Has the patient contacted their pharmacy? yes ((Agent: If yes, when and what did the pharmacy advise?)contact pcp  Preferred Pharmacy (with phone number or street name): [facility] - [place], [place] - [address] Phone: [phone]  Fax: [phone]   Has the patient been seen for an appointment in the last year OR does the patient have an upcoming appointment? yes  Agent: Please be advised that RX refills may take up to 3 business days. We ask that you follow-up with your pharmacy.

## 2023-05-20 MED ORDER — CLONIDINE HCL 0.1 MG PO TABS
0.1000 mg | ORAL_TABLET | Freq: Three times a day (TID) | ORAL | 0 refills | Status: DC
Start: 2023-05-20 — End: 2023-07-13

## 2023-05-20 NOTE — Telephone Encounter (Signed)
Requested medication (s) are due for refill today: No  Requested medication (s) are on the active medication list: Yes  Last refill:  05/17/23  Future visit scheduled: Yes  Notes to clinic:  Refilled 05/17/23 # 270.    Requested Prescriptions  Pending Prescriptions Disp Refills   cloNIDine (CATAPRES) 0.1 MG tablet 270 tablet 0    Sig: Take 1 tablet (0.1 mg total) by mouth 3 (three) times daily.     Cardiovascular:  Alpha-2 Agonists Failed - 05/18/2023  3:29 PM      Failed - Valid encounter within last 6 months    Recent Outpatient Visits           6 months ago Paresthesia   [place] Hoy Register, MD   9 months ago Neck pain   [facility] Hoy Register, MD   1 year ago Hallux valgus of left foot   [place] [facility] Hoy Register, MD   1 year ago Benign hypertension with chronic kidney disease, stage IV [facility])   [place] Hoy Register, MD   2 years ago Annual physical exam    [facility] Hoy Register, MD       Future Appointments             In 1 month Hoy Register, MD [facility]            Passed - Last BP in normal range    BP Readings from Last 1 Encounters:  11/05/22 131/88         Passed - Last Heart Rate in normal range    Pulse Readings from Last 1 Encounters:  11/05/22 77

## 2023-06-14 ENCOUNTER — Telehealth: Payer: Self-pay | Admitting: Family Medicine

## 2023-06-14 NOTE — Telephone Encounter (Signed)
Pt is calling back returning call to schedule with the correct provider. Please advise CB- [phone]

## 2023-07-05 ENCOUNTER — Ambulatory Visit: Payer: Commercial Managed Care - HMO | Admitting: Family Medicine

## 2023-07-07 ENCOUNTER — Ambulatory Visit: Payer: Commercial Managed Care - HMO | Admitting: Physician Assistant

## 2023-07-07 ENCOUNTER — Other Ambulatory Visit: Payer: Self-pay | Admitting: Family Medicine

## 2023-07-07 DIAGNOSIS — E782 Mixed hyperlipidemia: Secondary | ICD-10-CM

## 2023-07-13 ENCOUNTER — Other Ambulatory Visit: Payer: Self-pay

## 2023-07-13 ENCOUNTER — Ambulatory Visit: Payer: Managed Care, Other (non HMO) | Attending: Family Medicine | Admitting: Family Medicine

## 2023-07-13 ENCOUNTER — Encounter: Payer: Self-pay | Admitting: Family Medicine

## 2023-07-13 ENCOUNTER — Ambulatory Visit
Admission: RE | Admit: 2023-07-13 | Discharge: 2023-07-13 | Disposition: A | Discharge status: Home / Self Care | Payer: Managed Care, Other (non HMO) | Source: Ambulatory Visit | Attending: Family Medicine | Admitting: Family Medicine

## 2023-07-13 ENCOUNTER — Ambulatory Visit: Payer: Commercial Managed Care - HMO | Admitting: Internal Medicine

## 2023-07-13 VITALS — BP 124/78 | HR 65 | Ht 63.5 in | Wt 115.6 lb

## 2023-07-13 DIAGNOSIS — Z1211 Encounter for screening for malignant neoplasm of colon: Secondary | ICD-10-CM | POA: Diagnosis not present

## 2023-07-13 DIAGNOSIS — E782 Mixed hyperlipidemia: Secondary | ICD-10-CM | POA: Diagnosis not present

## 2023-07-13 DIAGNOSIS — I129 Hypertensive chronic kidney disease with stage 1 through stage 4 chronic kidney disease, or unspecified chronic kidney disease: Secondary | ICD-10-CM

## 2023-07-13 DIAGNOSIS — M542 Cervicalgia: Secondary | ICD-10-CM

## 2023-07-13 DIAGNOSIS — G8929 Other chronic pain: Secondary | ICD-10-CM

## 2023-07-13 DIAGNOSIS — N184 Chronic kidney disease, stage 4 (severe): Secondary | ICD-10-CM

## 2023-07-13 DIAGNOSIS — Z1231 Encounter for screening mammogram for malignant neoplasm of breast: Secondary | ICD-10-CM

## 2023-07-13 DIAGNOSIS — M25511 Pain in right shoulder: Secondary | ICD-10-CM

## 2023-07-13 MED ORDER — DICLOFENAC SODIUM 1 % EX GEL: 4.0000 g | Freq: Four times a day (QID) | CUTANEOUS | 1 refills | Status: AC

## 2023-07-13 MED ORDER — AMLODIPINE BESYLATE 10 MG PO TABS
10.0000 mg | ORAL_TABLET | Freq: Every day | ORAL | 1 refills | Status: DC
Start: 2023-07-13 — End: 2024-01-10

## 2023-07-13 MED ORDER — LIDOCAINE 5 % EX PTCH
1.0000 | MEDICATED_PATCH | CUTANEOUS | 2 refills | Status: DC
  Filled 2023-07-13: qty 30, 30d supply, fill #0

## 2023-07-13 MED ORDER — AMLODIPINE BESYLATE 10 MG PO TABS
10.0000 mg | ORAL_TABLET | Freq: Every day | ORAL | 1 refills | Status: DC
Start: 2023-07-13 — End: 2023-07-13
  Filled 2023-07-13: qty 90, 90d supply, fill #0

## 2023-07-13 MED ORDER — LIDOCAINE 5 % EX PTCH: 1.0000 | MEDICATED_PATCH | CUTANEOUS | 2 refills | Status: DC

## 2023-07-13 MED ORDER — CLONIDINE HCL 0.1 MG PO TABS
0.1000 mg | ORAL_TABLET | Freq: Three times a day (TID) | ORAL | 1 refills | Status: DC
Start: 2023-07-13 — End: 2023-11-09

## 2023-07-13 MED ORDER — DICLOFENAC SODIUM 1 % EX GEL
4.0000 g | Freq: Four times a day (QID) | CUTANEOUS | 1 refills | Status: DC
  Filled 2023-07-13: qty 100, fill #0

## 2023-07-13 MED ORDER — CLONIDINE HCL 0.1 MG PO TABS
0.1000 mg | ORAL_TABLET | Freq: Three times a day (TID) | ORAL | 1 refills | Status: DC
Start: 2023-07-13 — End: 2023-07-13
  Filled 2023-07-13: qty 270, 90d supply, fill #0

## 2023-07-13 NOTE — Patient Instructions (Signed)
VISIT SUMMARY:  During today's visit, we discussed your right shoulder pain, which has been bothering you for a couple of months. We also reviewed your ongoing management for chronic kidney disease, hypertension, and hyperlipidemia. Additionally, we addressed some routine health maintenance screenings.  YOUR PLAN:  -SHOULDER PAIN: Your shoulder pain seems to be related to possible arthritis, which is a condition that causes inflammation and pain in the joints. We will order a shoulder X-ray to get a better look at what might be causing the pain. In the meantime, I am prescribing a topical anti-inflammatory cream to help manage the pain, as oral anti-inflammatories are not suitable for you due to your chronic kidney disease.  -CHRONIC KIDNEY DISEASE: Chronic kidney disease is a long-term condition where the kidneys do not work as well as they should. You will continue to follow up with your nephrologist for this condition, and no changes to your current treatment plan were made today.  -HYPERTENSION AND HYPERLIPIDEMIA: Hypertension (high blood pressure) and hyperlipidemia (high cholesterol) are being managed with your current medications: amlodipine, clonidine, and atorvastatin. We clarified your medication refills today, and I have refilled your prescriptions for amlodipine and clonidine. Your atorvastatin was recently refilled.  -GENERAL HEALTH MAINTENANCE: For your routine health maintenance, we have ordered a mammogram for breast cancer screening and a Cologuard test for colorectal cancer screening, as you preferred this over a colonoscopy.  INSTRUCTIONS:  Please follow up with the shoulder X-ray as soon as possible. Continue using the prescribed topical anti-inflammatory cream as directed. Ensure you attend your routine screenings for the mammogram and Cologuard test. If you have any questions or concerns, do not hesitate to contact our office.

## 2023-07-13 NOTE — Progress Notes (Signed)
Subjective:  Patient ID: Rebecca Marquez, female    DOB: 08-08-1961  Age: 62 y.o. MRN: [id number]  CC: Medical Management of Chronic Issues (Right shoulder pain)   HPI Aleisha Longwell is a 62 y.o. year old female with a history of hypertension, hyperlipidemia stage IV chronic kidney disease (followed by Nephrology)    Interval History: Discussed the use of AI scribe software for clinical note transcription with the patient, who gave verbal consent to proceed.  She presents with right shoulder pain of a couple months duration. The pain is not constant, but is exacerbated by work, despite not performing strenuous activities. The pain is localized to the shoulder and sometimes radiates to the back. She denies any associated numbness, tingling, or neck pain. She has been managing the pain with Tylenol, which provides inconsistent relief.  The patient is currently taking amlodipine and atorvastatin for hypertension and hyperlipidemia, respectively. She also takes clonidine tabs for hypertension, but has discontinued the patch form of the medication. She denies taking gabapentin. She is also under the care of a nephrologist for chronic kidney disease.        Past Medical History:  Diagnosis Date   Hypertension Dx March 2015   Kidney disease    Renal disorder    R/T HTN   Seasonal allergies     Past Surgical History:  Procedure Laterality Date   TUBAL LIGATION      Family History  Problem Relation Age of Onset   Diabetes Mother    Arthritis Mother    Multiple sclerosis Daughter 8   Colon cancer Neg Hx    Colon polyps Neg Hx    Esophageal cancer Neg Hx    Stomach cancer Neg Hx    Rectal cancer Neg Hx     Social History   Socioeconomic History   Marital status: Legally Separated    Spouse name: Not on file   Number of children: Not on file   Years of education: Not on file   Highest education level: Not on file  Occupational History   Not on file  Tobacco Use    Smoking status: Former    Current packs/day: 0.00    Average packs/day: 0.5 packs/day for 20.0 years (10.0 ttl pk-yrs)    Types: Cigarettes    Start date: 08/31/1981    Quit date: 08/31/2001    Years since quitting: 21.8   Smokeless tobacco: Never  Vaping Use   Vaping status: Never Used  Substance and Sexual Activity   Alcohol use: Yes    Alcohol/week: 1.0 standard drink of alcohol    Types: 1 Cans of beer per week    Comment: occasional   Drug use: Yes    Types: Marijuana    Comment: once a day   Sexual activity: Not on file  Other Topics Concern   Not on file  Social History Narrative   Not on file   Social Determinants of Health   Financial Resource Strain: Not on file  Food Insecurity: Not on file  Transportation Needs: Not on file  Physical Activity: Not on file  Stress: Not on file  Social Connections: Not on file    Allergies  Allergen Reactions   Nsaids Other (See Comments)    ckd   Losartan Tinitus    Outpatient Medications Prior to Visit  Medication Sig Dispense Refill   atorvastatin (LIPITOR) 20 MG tablet TAKE 1 TABLET DAILY 90 tablet 0   ondansetron (ZOFRAN-ODT) 4 MG  disintegrating tablet Take 1 tablet (4 mg total) by mouth every 8 (eight) hours as needed for nausea or vomiting. 20 tablet 0   amLODipine (NORVASC) 10 MG tablet Take 1 tablet (10 mg total) by mouth daily. 90 tablet 0   cloNIDine (CATAPRES) 0.1 MG tablet Take 1 tablet (0.1 mg total) by mouth 3 (three) times daily. 270 tablet 0   cyclobenzaprine (FLEXERIL) 5 MG tablet Take 1 tablet (5 mg total) by mouth at bedtime. (Patient not taking: Reported on 07/13/2023) 30 tablet 1   traMADol (ULTRAM) 50 MG tablet Take 1 tablet (50 mg total) by mouth every 12 (twelve) hours as needed. (Patient not taking: Reported on 07/13/2023) 30 tablet 0   cloNIDine (CATAPRES - DOSED IN MG/24 HR) 0.1 mg/24hr patch Place 1 patch (0.1 mg total) onto the skin once a week. Take clonidine 0.1 mg tablets 3 times a day only on  the first day that you apply the patch. (Patient not taking: Reported on 07/13/2023) 15 patch 3   cloNIDine (CATAPRES - DOSED IN MG/24 HR) 0.2 mg/24hr patch Place 1 patch (0.2 mg total) onto the skin once a week. Take clonidine 0.1mg  3 times a day on the first day that you apply the patch. (First application only) (Patient not taking: Reported on 07/13/2023) 16 patch 2   gabapentin (NEURONTIN) 100 MG capsule Take 1 capsule (100 mg total) by mouth at bedtime. (Patient not taking: Reported on 07/13/2023) 90 capsule 1   No facility-administered medications prior to visit.     ROS Review of Systems  Constitutional:  Negative for activity change and appetite change.  HENT:  Negative for sinus pressure and sore throat.   Respiratory:  Negative for chest tightness, shortness of breath and wheezing.   Cardiovascular:  Negative for chest pain and palpitations.  Gastrointestinal:  Negative for abdominal distention, abdominal pain and constipation.  Genitourinary: Negative.   Musculoskeletal:        See HPI  Psychiatric/Behavioral:  Negative for behavioral problems and dysphoric mood.     Objective:  BP 124/78   Pulse 65   Ht 5' 3.5" (1.613 m)   Wt 115 lb 9.6 oz (52.4 kg)   SpO2 100%   BMI 20.16 kg/m      07/13/2023    2:47 PM 01/22/2023   10:04 AM 11/05/2022   10:04 AM  BP/Weight  Systolic BP 124  409  Diastolic BP 78  88  Wt. (Lbs) 115.6 114 110.4  BMI 20.16 kg/m2 19.88 kg/m2 19.56 kg/m2      Physical Exam Constitutional:      Appearance: She is well-developed.  Cardiovascular:     Rate and Rhythm: Normal rate.     Heart sounds: Normal heart sounds. No murmur heard. Pulmonary:     Effort: Pulmonary effort is normal.     Breath sounds: Normal breath sounds. No wheezing or rales.  Chest:     Chest wall: No tenderness.  Abdominal:     General: Bowel sounds are normal. There is no distension.     Palpations: Abdomen is soft. There is no mass.     Tenderness: There is no  abdominal tenderness.  Musculoskeletal:        General: Normal range of motion.     Right shoulder: Tenderness (anterior shoulder joint) present. Normal strength.     Left shoulder: Normal strength.     Right lower leg: No edema.     Left lower leg: No edema.  Neurological:  Mental Status: She is alert and oriented to person, place, and time.  Psychiatric:        Mood and Affect: Mood normal.        Latest Ref Rng & Units 01/22/2023    1:00 PM 08/03/2022    8:41 AM 04/28/2022    9:21 AM  CMP  Glucose 70 - 99 mg/dL 85  91  87   BUN 6 - 20 mg/dL 31  28  37   Creatinine 0.44 - 1.00 mg/dL 4.09  8.11  9.14   Sodium 135 - 145 mmol/L 138  142  140   Potassium 3.5 - 5.1 mmol/L 4.9  4.3  4.5   Chloride 98 - 111 mmol/L 104  105  103   CO2 22 - 32 mmol/L 23  22  19    Calcium 8.9 - 10.3 mg/dL 9.0  9.1  9.5   Total Protein 6.0 - 8.5 g/dL  6.9    Total Bilirubin 0.0 - 1.2 mg/dL  <7.8    Alkaline Phos 44 - 121 IU/L  112    AST 0 - 40 IU/L  26    ALT 0 - 32 IU/L  19      Lipid Panel     Component Value Date/Time   CHOL 180 08/03/2022 0841   TRIG 103 08/03/2022 0841   HDL 66 08/03/2022 0841   CHOLHDL 3.2 01/03/2020 1055   CHOLHDL 4.0 09/25/2016 0949   VLDL 17 09/25/2016 0949   LDLCALC 96 08/03/2022 0841    CBC    Component Value Date/Time   WBC 4.0 05/08/2020 1000   WBC 5.0 01/19/2017 2118   RBC 4.38 05/08/2020 1000   RBC 4.41 01/19/2017 2118   HGB 15.0 05/08/2020 1000   HCT 43.3 05/08/2020 1000   PLT 222 05/08/2020 1000   MCV 99 (H) 05/08/2020 1000   MCH 34.2 (H) 05/08/2020 1000   MCH 31.5 01/19/2017 2118   MCHC 34.6 05/08/2020 1000   MCHC 33.4 01/19/2017 2118   RDW 14.0 05/08/2020 1000   LYMPHSABS 2.0 01/19/2017 2118   MONOABS 0.3 01/19/2017 2118   EOSABS 0.3 01/19/2017 2118   BASOSABS 0.1 01/19/2017 2118    Lab Results  Component Value Date   HGBA1C 5.8 (A) 11/05/2022    Assessment & Plan:      Shoulder Pain Pain localized to the right shoulder,  exacerbated by work activities. No associated numbness, tingling, or neck pain. Physical examination suggests possible arthritis. -Order shoulder X-ray to further evaluate. -Prescribe topical anti-inflammatory cream due to contraindication for oral anti-inflammatories given chronic kidney disease.  Chronic Kidney Disease stage IV Patient is under the care of a nephrologist. No changes in medication regimen discussed. -Avoid nephrotoxins -Continue current management under nephrologist.  Hypertension and Hyperlipidemia Patient is on amlodipine, clonidine, and atorvastatin.  -Refill amlodipine and clonidine prescriptions. Atorvastatin was recently refilled.  General Health Maintenance -Order mammogram for routine breast cancer screening. -Order Cologuard test for colorectal cancer screening as an alternative to colonoscopy per patient's preference.      Labs ordered but she prefers to have this done at her nephrologist.    Meds ordered this encounter  Medications   DISCONTD: amLODipine (NORVASC) 10 MG tablet    Sig: Take 1 tablet (10 mg total) by mouth daily.    Dispense:  90 tablet    Refill:  1   DISCONTD: cloNIDine (CATAPRES) 0.1 MG tablet    Sig: Take 1 tablet (0.1 mg total) by mouth  3 (three) times daily.    Dispense:  270 tablet    Refill:  1   DISCONTD: diclofenac Sodium (VOLTAREN) 1 % GEL    Sig: Apply 4 g topically 4 (four) times daily.    Dispense:  100 g    Refill:  1   DISCONTD: lidocaine (LIDODERM) 5 %    Sig: Place 1 patch onto the skin daily. Remove & Discard patch within 12 hours or as directed by MD    Dispense:  30 patch    Refill:  2   amLODipine (NORVASC) 10 MG tablet    Sig: Take 1 tablet (10 mg total) by mouth daily.    Dispense:  90 tablet    Refill:  1   cloNIDine (CATAPRES) 0.1 MG tablet    Sig: Take 1 tablet (0.1 mg total) by mouth 3 (three) times daily.    Dispense:  270 tablet    Refill:  1   diclofenac Sodium (VOLTAREN) 1 % GEL    Sig: Apply  4 g topically 4 (four) times daily.    Dispense:  100 g    Refill:  1   lidocaine (LIDODERM) 5 %    Sig: Place 1 patch onto the skin daily. Remove & Discard patch within 12 hours or as directed by MD    Dispense:  30 patch    Refill:  2    Follow-up: Return in about 6 months (around 01/10/2024) for Chronic medical conditions.       Hoy Register, MD, FAAFP. [facility] [place], [place] [phone]   07/13/2023, 3:24 PM

## 2023-07-14 ENCOUNTER — Other Ambulatory Visit: Payer: Self-pay | Admitting: Family Medicine

## 2023-07-14 NOTE — Telephone Encounter (Signed)
Medication Refill -  Most Recent Primary Care Visit:  Provider: Hoy Register  Department: [facility]  Visit Type: OFFICE VISIT  Date: 07/13/2023  Medication:  lidocaine (LIDODERM) 5 %  Delorise Shiner with [facility] is requesting prescription to be sent over for more 30 days because [employer] can not fill less than 30 day prescriptions   Has the patient contacted their pharmacy? Delorise Shiner with [facility] is calling in regards to this  Is this the correct pharmacy for this prescription? Yes, the one listed below If no, delete pharmacy and type the correct one.  This is the patient's preferred pharmacy:  [facility] - [place], [place] - [address] [place] [address] Phone: [phone] Fax: [phone]   Has the prescription been filled recently? I am not sure, it was sent for a 30 day refill and Delorise Shiner states it needs to be more than 30 days  Is the patient out of the medication? I am not sure, Delorise Shiner with [facility] is calling in regards for the patient  Has the patient been seen for an appointment in the last year OR does the patient have an upcoming appointment?  Yes, upcoming appointment on 5.13.2025

## 2023-07-15 MED ORDER — LIDOCAINE 5 % EX PTCH: 1.0000 | MEDICATED_PATCH | CUTANEOUS | 0 refills | Status: AC

## 2023-07-15 NOTE — Telephone Encounter (Signed)
Refill for 90 days.  Requested Prescriptions  Pending Prescriptions Disp Refills   lidocaine (LIDODERM) 5 % 90 patch 0    Sig: Place 1 patch onto the skin daily. Remove & Discard patch within 12 hours or as directed by MD     Analgesics:  Topicals Failed - 07/14/2023 11:19 AM      Failed - Manual Review: Labs are only required if the patient has taken medication for more than 8 weeks.      Failed - PLT in normal range and within 360 days    Platelets  Date Value Ref Range Status  05/08/2020 222 150 - 450 x10E3/uL Final         Failed - HGB in normal range and within 360 days    Hemoglobin  Date Value Ref Range Status  05/08/2020 15.0 11.1 - 15.9 g/dL Final         Failed - HCT in normal range and within 360 days    Hematocrit  Date Value Ref Range Status  05/08/2020 43.3 34.0 - 46.6 % Final         Failed - eGFR is 30 or above and within 360 days    GFR, Est African American  Date Value Ref Range Status  09/25/2016 29 (L) >=60 mL/min Final   GFR calc Af Amer  Date Value Ref Range Status  05/08/2020 30 (L) >59 mL/min/1.73 Final    Comment:    **[facility] currently reports eGFR in compliance with the current**   recommendations of the [employer]. [facility] will   update reporting as new guidelines are published from the NKF-ASN   Task force.    GFR, Est Non African American  Date Value Ref Range Status  09/25/2016 25 (L) >=60 mL/min Final   GFR, Estimated  Date Value Ref Range Status  01/22/2023 24 (L) >60 mL/min Final    Comment:    (NOTE) Calculated using the CKD-EPI Creatinine Equation (2021)    eGFR  Date Value Ref Range Status  08/03/2022 25 (L) >59 mL/min/1.73 Final   EGFR  Date Value Ref Range Status  03/19/2023 18.0  Final    Comment:    Abstracted by HIM         Passed - Cr in normal range and within 360 days    Creat  Date Value Ref Range Status  09/25/2016 2.18 (H) 0.50 - 1.05 mg/dL Final    Comment:      For patients > or  = 60 years of age: The upper reference limit for Creatinine is approximately 13% higher for people identified as African-American.      Creatinine, Ser  Date Value Ref Range Status  01/22/2023 2.31 (H) 0.44 - 1.00 mg/dL Final   Creatinine, POC  Date Value Ref Range Status  03/19/2023 139.8 mg/dL Final    Comment:    Abstracted by HIM   Creatinine, Urine  Date Value Ref Range Status  09/28/2014 242.0 mg/dL Final    Comment:    No reference range established.         Passed - Patient is not pregnant      Passed - Valid encounter within last 12 months    Recent Outpatient Visits           2 days ago Benign hypertension with chronic kidney disease, stage IV (HCC)   [facility] Of [place]. [facility] Hoy Register, MD   8  months ago Paresthesia   [facility] - A Dept Of [place]. [facility] Hoy Register, MD   11 months ago Neck pain   [facility] - [facility] Dept Of [place]. [facility] Hoy Register, MD   1 year ago Hallux valgus of left foot   [facility] - A Dept Of [place]. [facility] Hoy Register, MD   1 year ago Benign hypertension with chronic kidney disease, stage IV [facility])   [facility] [facility] - [facility] Dept Of [place]. [facility] Hoy Register, MD       Future Appointments             In 5 months Hoy Register, MD [facility] - A Dept Of [place]. [facility]

## 2023-07-22 ENCOUNTER — Telehealth: Payer: Self-pay

## 2023-07-22 NOTE — Telephone Encounter (Signed)
Copied from CRM [id number]. Topic: General - Call Back - No Documentation >> Jul 22, 2023 11:04 AM Payton Doughty wrote: Reason for CRM: pt would like results  of shoulder xray done 11/12.  She has not heard anything back.

## 2023-07-27 ENCOUNTER — Encounter: Payer: Self-pay | Admitting: Family Medicine

## 2023-07-27 NOTE — Telephone Encounter (Signed)
Radiology has been called and they informed me that they will put the order on a STAT list so that we can get the results.

## 2023-07-28 ENCOUNTER — Other Ambulatory Visit: Payer: Self-pay | Admitting: Family Medicine

## 2023-07-28 DIAGNOSIS — G8929 Other chronic pain: Secondary | ICD-10-CM

## 2023-07-28 NOTE — Telephone Encounter (Signed)
Addressed.

## 2023-08-09 NOTE — Progress Notes (Unsigned)
Office Visit Note   Patient: Rebecca Marquez           Date of Birth: [birth year]           MRN: [id number] Visit Date: 08/10/2023              Requested by: Hoy Register, MD [address] [place],  [place] [address] PCP: Hoy Register, MD   Assessment & Plan: Visit Diagnoses: No diagnosis found.  Plan: ***  Follow-Up Instructions: No follow-ups on file.   Orders:  No orders of the defined types were placed in this encounter.  No orders of the defined types were placed in this encounter.     Procedures: No procedures performed   Clinical Data: No additional findings.   Subjective: No chief complaint on file.   HPI  Review of Systems  Constitutional: Negative.   HENT: Negative.    Eyes: Negative.   Respiratory: Negative.    Cardiovascular: Negative.   Endocrine: Negative.   Musculoskeletal: Negative.   Neurological: Negative.   Hematological: Negative.   Psychiatric/Behavioral: Negative.    All other systems reviewed and are negative.   Objective: Vital Signs: There were no vitals taken for this visit.  Physical Exam Vitals and nursing note reviewed.  Constitutional:      Appearance: She is well-developed.  HENT:     Head: Atraumatic.     Nose: Nose normal.  Eyes:     Extraocular Movements: Extraocular movements intact.  Cardiovascular:     Pulses: Normal pulses.  Pulmonary:     Effort: Pulmonary effort is normal.  Abdominal:     Palpations: Abdomen is soft.  Musculoskeletal:     Cervical back: Neck supple.  Skin:    General: Skin is warm.     Capillary Refill: Capillary refill takes less than 2 seconds.  Neurological:     Mental Status: She is alert. Mental status is at baseline.  Psychiatric:        Behavior: Behavior normal.        Thought Content: Thought content normal.        Judgment: Judgment normal.   Ortho Exam  Specialty Comments:  No specialty comments available.  Imaging: No results  found.   PMFS History: Patient Active Problem List   Diagnosis Date Noted  . Lumbar degenerative disc disease 10/07/2019  . 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 Transient vision disturbance of both eyes 09/25/2016  . Poor dentition 02/06/2016  . Glossitis 04/29/2015  . HTN (hypertension) 12/11/2013  . HLD (hyperlipidemia) 11/16/2013  . CKD (chronic kidney disease) stage 4, GFR 15-29 ml/min (HCC) 11/16/2013   Past Medical History:  Diagnosis Date  . Hypertension Dx March 2015  . Kidney disease   . Renal disorder    R/T HTN  . Seasonal allergies     Family History  Problem Relation Age of Onset  . Diabetes Mother   . Arthritis Mother   . Multiple sclerosis Daughter 24  . Colon cancer Neg Hx   . Colon polyps Neg Hx   . Esophageal cancer Neg Hx   . Stomach cancer Neg Hx   . Rectal cancer Neg Hx     Past Surgical History:  Procedure Laterality Date  . TUBAL LIGATION     Social History   Occupational History  . Not on file  Tobacco Use  . Smoking status: Former    Current packs/day: 0.00    Average packs/day: 0.5 packs/day for 20.0  years (10.0 ttl pk-yrs)    Types: Cigarettes    Start date: 08/31/1981    Quit date: 08/31/2001    Years since quitting: 21.9  . Smokeless tobacco: Never  Vaping Use  . Vaping status: Never Used  Substance and Sexual Activity  . Alcohol use: Yes    Alcohol/week: 1.0 standard drink of alcohol    Types: 1 Cans of beer per week    Comment: occasional  . Drug use: Yes    Types: Marijuana    Comment: once a day  . Sexual activity: Not on file

## 2023-08-10 ENCOUNTER — Other Ambulatory Visit: Payer: Self-pay

## 2023-08-10 ENCOUNTER — Ambulatory Visit: Payer: Commercial Managed Care - HMO | Admitting: Orthopaedic Surgery

## 2023-08-10 DIAGNOSIS — G8929 Other chronic pain: Secondary | ICD-10-CM

## 2023-08-10 DIAGNOSIS — M25511 Pain in right shoulder: Secondary | ICD-10-CM

## 2023-08-10 MED ORDER — PREDNISONE 10 MG (21) PO TBPK
ORAL_TABLET | ORAL | 3 refills | Status: DC
  Filled 2023-08-10: qty 21, 6d supply, fill #0
  Filled 2023-10-26: qty 21, 6d supply, fill #1
  Filled 2024-01-08: qty 21, 6d supply, fill #2
  Filled 2024-01-14: qty 21, 6d supply, fill #3

## 2023-08-11 ENCOUNTER — Other Ambulatory Visit: Payer: Self-pay

## 2023-08-13 ENCOUNTER — Ambulatory Visit
Admission: RE | Admit: 2023-08-13 | Discharge: 2023-08-13 | Disposition: A | Discharge status: Home / Self Care | Payer: Commercial Managed Care - HMO | Source: Ambulatory Visit | Attending: Family Medicine | Admitting: Family Medicine

## 2023-08-13 ENCOUNTER — Ambulatory Visit: Payer: Managed Care, Other (non HMO)

## 2023-08-13 DIAGNOSIS — Z1231 Encounter for screening mammogram for malignant neoplasm of breast: Secondary | ICD-10-CM

## 2023-10-21 ENCOUNTER — Other Ambulatory Visit: Payer: Self-pay | Admitting: Family Medicine

## 2023-10-21 DIAGNOSIS — E782 Mixed hyperlipidemia: Secondary | ICD-10-CM

## 2023-10-26 ENCOUNTER — Other Ambulatory Visit: Payer: Self-pay

## 2023-10-28 ENCOUNTER — Other Ambulatory Visit: Payer: Self-pay | Admitting: Family Medicine

## 2023-10-28 DIAGNOSIS — N184 Chronic kidney disease, stage 4 (severe): Secondary | ICD-10-CM

## 2023-10-28 NOTE — Telephone Encounter (Signed)
 Last Fill: 07/13/23  Last OV: 07/13/23 Next OV: 01/10/24  Routing to provider for review/authorization.

## 2023-10-28 NOTE — Telephone Encounter (Signed)
 Copied from CRM [id number]. Topic: Clinical - Medication Refill >> Oct 28, 2023  2:52 PM Priscille Loveless wrote: Most Recent Primary Care Visit:  Provider: Hoy Register  Department: [facility]  Visit Type: OFFICE VISIT  Date: 07/13/2023 amLODipine (NORVASC) 10 MG tablet Medication:   Has the patient contacted their pharmacy? Yes Is this the correct pharmacy for this prescription? Yes  This is the patient's preferred pharmacy:  [facility] - [place], [place] - [address] [place] [address] Phone: [phone] Fax: [phone]    Has the prescription been filled recently? Yes  Is the patient out of the medication? Yes  Has the patient been seen for an appointment in the last year OR does the patient have an upcoming appointment? Yes  Can we respond through MyChart? Yes  Agent: Please be advised that Rx refills may take up to 3 business days. We ask that you follow-up with your pharmacy.   Pt states it was delivered to wrong address and does not have it. The refill has not been sent back. Please advise.

## 2023-11-09 ENCOUNTER — Other Ambulatory Visit: Payer: Self-pay

## 2023-11-09 ENCOUNTER — Other Ambulatory Visit: Payer: Self-pay | Admitting: Family Medicine

## 2023-11-09 DIAGNOSIS — I129 Hypertensive chronic kidney disease with stage 1 through stage 4 chronic kidney disease, or unspecified chronic kidney disease: Secondary | ICD-10-CM

## 2023-11-09 MED ORDER — CLONIDINE HCL 0.1 MG PO TABS
0.1000 mg | ORAL_TABLET | Freq: Three times a day (TID) | ORAL | 0 refills | Status: DC
Start: 2023-11-09 — End: 2024-01-10
  Filled 2023-11-09: qty 270, 90d supply, fill #0

## 2023-11-09 NOTE — Telephone Encounter (Signed)
 Last Fill: 07/13/23  Last OV: 07/13/23 Next OV: 01/10/24  Routing to provider for review/authorization.    Copied from CRM [id number]. Topic: Clinical - Medication Refill >> Nov 09, 2023  9:53 AM Priscille Loveless wrote: Most Recent Primary Care Visit:  Provider: Hoy Register  Department: [facility]  Visit Type: OFFICE VISIT  Date: 07/13/2023  Medication: cloNIDine (CATAPRES) 0.1 MG tablet  Has the patient contacted their pharmacy? No  Is this the correct pharmacy for this prescription? Yes  This is the patient's preferred pharmacy:   [facility] [address] [place] [address] Phone: [phone] Fax: [phone]  Has the prescription been filled recently? Yes  Is the patient out of the medication? Yes  Has the patient been seen for an appointment in the last year OR does the patient have an upcoming appointment? Yes  Can we respond through MyChart? Yes  Agent: Please be advised that Rx refills may take up to 3 business days. We ask that you follow-up with your pharmacy.

## 2023-11-11 ENCOUNTER — Other Ambulatory Visit: Payer: Self-pay

## 2023-12-30 ENCOUNTER — Other Ambulatory Visit: Payer: Self-pay | Admitting: Nephrology

## 2023-12-30 DIAGNOSIS — N184 Chronic kidney disease, stage 4 (severe): Secondary | ICD-10-CM

## 2023-12-30 DIAGNOSIS — R809 Proteinuria, unspecified: Secondary | ICD-10-CM

## 2023-12-30 DIAGNOSIS — D1771 Benign lipomatous neoplasm of kidney: Secondary | ICD-10-CM

## 2023-12-31 LAB — LAB REPORT - SCANNED
Creatinine, POC: 190.6 mg/dL
EGFR: 23

## 2024-01-06 ENCOUNTER — Other Ambulatory Visit

## 2024-01-10 ENCOUNTER — Encounter: Payer: Self-pay | Admitting: Family Medicine

## 2024-01-10 ENCOUNTER — Ambulatory Visit: Payer: Managed Care, Other (non HMO) | Attending: Family Medicine | Admitting: Family Medicine

## 2024-01-10 ENCOUNTER — Other Ambulatory Visit: Payer: Self-pay

## 2024-01-10 DIAGNOSIS — E782 Mixed hyperlipidemia: Secondary | ICD-10-CM

## 2024-01-10 DIAGNOSIS — N184 Chronic kidney disease, stage 4 (severe): Secondary | ICD-10-CM

## 2024-01-10 DIAGNOSIS — I129 Hypertensive chronic kidney disease with stage 1 through stage 4 chronic kidney disease, or unspecified chronic kidney disease: Secondary | ICD-10-CM

## 2024-01-10 DIAGNOSIS — M19011 Primary osteoarthritis, right shoulder: Secondary | ICD-10-CM

## 2024-01-10 MED ORDER — ATORVASTATIN CALCIUM 20 MG PO TABS
20.0000 mg | ORAL_TABLET | Freq: Every day | ORAL | 1 refills | Status: DC
  Filled 2024-01-10: qty 90, 90d supply, fill #0
  Filled 2024-02-28 – 2024-04-09 (×2): qty 90, 90d supply, fill #1

## 2024-01-10 MED ORDER — CLONIDINE HCL 0.1 MG PO TABS
0.1000 mg | ORAL_TABLET | Freq: Three times a day (TID) | ORAL | 1 refills | Status: DC
  Filled 2024-01-10 – 2024-02-28 (×2): qty 270, 90d supply, fill #0
  Filled 2024-05-29: qty 270, 90d supply, fill #1

## 2024-01-10 MED ORDER — AMLODIPINE BESYLATE 10 MG PO TABS
10.0000 mg | ORAL_TABLET | Freq: Every day | ORAL | 1 refills | Status: DC
  Filled 2024-01-10: qty 30, 30d supply, fill #0
  Filled 2024-02-28: qty 30, 30d supply, fill #1
  Filled 2024-04-09: qty 30, 30d supply, fill #2
  Filled 2024-05-09: qty 30, 30d supply, fill #3
  Filled 2024-06-12 (×2): qty 30, 30d supply, fill #4
  Filled 2024-06-28 – 2024-07-10 (×2): qty 30, 30d supply, fill #5

## 2024-01-10 NOTE — Progress Notes (Signed)
 Subjective:  Patient ID: Rebecca Marquez, female    DOB: Apr 26, 1961  Age: 63 y.o. MRN: [id number]  CC: Medical Management of Chronic Issues (No concerns)     Discussed the use of AI scribe software for clinical note transcription with the patient, who gave verbal consent to proceed.  History of Present Illness Anaika Wiggers is a 63 year old female with  a history of hypertension, hyperlipidemia stage IV chronic kidney disease (followed by Nephrology)  who presents for medication management and follow-up on shoulder pain.  She experiences significant pain in her right shoulder due to arthritis, confirmed by an X-ray. A steroid dose pack recommended by an orthopedic specialist provides relief for a couple of months. Her left shoulder remains asymptomatic, and there is no exacerbation of pain with cold or rain. She rarely uses her muscle relaxant due to its sedative effects.  Her blood pressure is 147/85 mmHg, which she attributes to the back that it is time to take her afternoon clonidine  dose. She takes clonidine  every eight hours and no longer experiences drowsiness. She also takes amlodipine  and atorvastatin  regularly.   Endorses adherence with her nephrology follow labs.  She takes kidney cleanse pills intermittently. Recent labs from her nephrologist showed a creatinine level of 2.4 mg/dL, with normal vitamin D and magnesium levels.  She is due for a colonoscopy and has a kit at home but has not yet completed it. She receives notifications reminding her to complete the test.    Past Medical History:  Diagnosis Date   Hypertension Dx March 2015   Kidney disease    Renal disorder    R/T HTN   Seasonal allergies     Past Surgical History:  Procedure Laterality Date   TUBAL LIGATION      Family History  Problem Relation Age of Onset   Diabetes Mother    Arthritis Mother    Multiple sclerosis Daughter 73   Colon cancer Neg Hx    Colon polyps Neg Hx    Esophageal cancer  Neg Hx    Stomach cancer Neg Hx    Rectal cancer Neg Hx     Social History   Socioeconomic History   Marital status: Legally Separated    Spouse name: Not on file   Number of children: Not on file   Years of education: Not on file   Highest education level: Some college, no degree  Occupational History   Not on file  Tobacco Use   Smoking status: Former    Current packs/day: 0.00    Average packs/day: 0.5 packs/day for 20.0 years (10.0 ttl pk-yrs)    Types: Cigarettes    Start date: 08/31/1981    Quit date: 08/31/2001    Years since quitting: 22.3   Smokeless tobacco: Never  Vaping Use   Vaping status: Never Used  Substance and Sexual Activity   Alcohol use: Yes    Alcohol/week: 1.0 standard drink of alcohol    Types: 1 Cans of beer per week    Comment: occasional   Drug use: Yes    Types: Marijuana    Comment: once a day   Sexual activity: Not on file  Other Topics Concern   Not on file  Social History Narrative   Not on file   Social Drivers of Health   Financial Resource Strain: Low Risk  (01/09/2024)   Overall Financial Resource Strain (CARDIA)    Difficulty of Paying Living Expenses: Not hard at all  Food Insecurity: No Food Insecurity (01/09/2024)   Hunger Vital Sign    Worried About Running Out of Food in the Last Year: Never true    Ran Out of Food in the Last Year: Never true  Transportation Needs: No Transportation Needs (01/09/2024)   PRAPARE - Administrator, Civil Service (Medical): No    Lack of Transportation (Non-Medical): No  Physical Activity: Unknown (01/09/2024)   Exercise Vital Sign    Days of Exercise per Week: 0 days    Minutes of Exercise per Session: Not on file  Stress: No Stress Concern Present (01/09/2024)   [employer] of Occupational Health - Occupational Stress Questionnaire    Feeling of Stress : Not at all  Social Connections: Moderately Isolated (01/09/2024)   Social Connection and Isolation Panel [NHANES]     Frequency of Communication with Friends and Family: More than three times a week    Frequency of Social Gatherings with Friends and Family: More than three times a week    Attends Religious Services: Never    Database administrator or Organizations: No    Attends Engineer, structural: Not on file    Marital Status: Living with partner    Allergies  Allergen Reactions   Nsaids Other (See Comments)    ckd   Losartan  Tinitus    Outpatient Medications Prior to Visit  Medication Sig Dispense Refill   predniSONE  (STERAPRED UNI-PAK 21 TAB) 10 MG (21) TBPK tablet Take as directed 21 tablet 3   amLODipine  (NORVASC ) 10 MG tablet Take 1 tablet (10 mg total) by mouth daily. 90 tablet 1   atorvastatin  (LIPITOR) 20 MG tablet TAKE 1 TABLET DAILY 90 tablet 3   cloNIDine  (CATAPRES ) 0.1 MG tablet Take 1 tablet (0.1 mg total) by mouth 3 (three) times daily. 270 tablet 0   cyclobenzaprine  (FLEXERIL ) 5 MG tablet Take 1 tablet (5 mg total) by mouth at bedtime. (Patient not taking: Reported on 01/10/2024) 30 tablet 1   diclofenac  Sodium (VOLTAREN ) 1 % GEL Apply 4 g topically 4 (four) times daily. (Patient not taking: Reported on 01/10/2024) 100 g 1   lidocaine  (LIDODERM ) 5 % Place 1 patch onto the skin daily. Remove & Discard patch within 12 hours or as directed by MD (Patient not taking: Reported on 01/10/2024) 90 patch 0   ondansetron  (ZOFRAN -ODT) 4 MG disintegrating tablet Take 1 tablet (4 mg total) by mouth every 8 (eight) hours as needed for nausea or vomiting. (Patient not taking: Reported on 01/10/2024) 20 tablet 0   traMADol  (ULTRAM ) 50 MG tablet Take 1 tablet (50 mg total) by mouth every 12 (twelve) hours as needed. (Patient not taking: Reported on 01/10/2024) 30 tablet 0   No facility-administered medications prior to visit.     ROS Review of Systems  Constitutional:  Negative for activity change and appetite change.  HENT:  Negative for sinus pressure and sore throat.   Respiratory:   Negative for chest tightness, shortness of breath and wheezing.   Cardiovascular:  Negative for chest pain and palpitations.  Gastrointestinal:  Negative for abdominal distention, abdominal pain and constipation.  Genitourinary: Negative.   Musculoskeletal: Negative.   Psychiatric/Behavioral:  Negative for behavioral problems and dysphoric mood.     Objective:  BP (!) 147/85   Pulse 62   Ht 5\' 3"  (1.6 m)   Wt 105 lb 12.8 oz (48 kg)   SpO2 99%   BMI 18.74 kg/m      01/10/2024  3:57 PM 07/13/2023    2:47 PM 01/22/2023   10:04 AM  BP/Weight  Systolic BP 147 124   Diastolic BP 85 78   Wt. (Lbs) 105.8 115.6 114  BMI 18.74 kg/m2 20.16 kg/m2 19.88 kg/m2      Physical Exam Constitutional:      Appearance: She is well-developed.  Cardiovascular:     Rate and Rhythm: Normal rate.     Heart sounds: Normal heart sounds. No murmur heard. Pulmonary:     Effort: Pulmonary effort is normal.     Breath sounds: Normal breath sounds. No wheezing or rales.  Chest:     Chest wall: No tenderness.  Abdominal:     General: Bowel sounds are normal. There is no distension.     Palpations: Abdomen is soft. There is no mass.     Tenderness: There is no abdominal tenderness.  Musculoskeletal:        General: Normal range of motion.     Right lower leg: No edema.     Left lower leg: No edema.  Neurological:     Mental Status: She is alert and oriented to person, place, and time.  Psychiatric:        Mood and Affect: Mood normal.        Latest Ref Rng & Units 01/22/2023    1:00 PM 08/03/2022    8:41 AM 04/28/2022    9:21 AM  CMP  Glucose 70 - 99 mg/dL 85  91  87   BUN 6 - 20 mg/dL 31  28  37   Creatinine 0.44 - 1.00 mg/dL 0.98  1.19  1.47   Sodium 135 - 145 mmol/L 138  142  140   Potassium 3.5 - 5.1 mmol/L 4.9  4.3  4.5   Chloride 98 - 111 mmol/L 104  105  103   CO2 22 - 32 mmol/L 23  22  19    Calcium  8.9 - 10.3 mg/dL 9.0  9.1  9.5   Total Protein 6.0 - 8.5 g/dL  6.9    Total  Bilirubin 0.0 - 1.2 mg/dL  <8.2    Alkaline Phos 44 - 121 IU/L  112    AST 0 - 40 IU/L  26    ALT 0 - 32 IU/L  19      Lipid Panel     Component Value Date/Time   CHOL 180 08/03/2022 0841   TRIG 103 08/03/2022 0841   HDL 66 08/03/2022 0841   CHOLHDL 3.2 01/03/2020 1055   CHOLHDL 4.0 09/25/2016 0949   VLDL 17 09/25/2016 0949   LDLCALC 96 08/03/2022 0841    CBC    Component Value Date/Time   WBC 4.0 05/08/2020 1000   WBC 5.0 01/19/2017 2118   RBC 4.38 05/08/2020 1000   RBC 4.41 01/19/2017 2118   HGB 15.0 05/08/2020 1000   HCT 43.3 05/08/2020 1000   PLT 222 05/08/2020 1000   MCV 99 (H) 05/08/2020 1000   MCH 34.2 (H) 05/08/2020 1000   MCH 31.5 01/19/2017 2118   MCHC 34.6 05/08/2020 1000   MCHC 33.4 01/19/2017 2118   RDW 14.0 05/08/2020 1000   LYMPHSABS 2.0 01/19/2017 2118   MONOABS 0.3 01/19/2017 2118   EOSABS 0.3 01/19/2017 2118   BASOSABS 0.1 01/19/2017 2118    Lab Results  Component Value Date   HGBA1C 5.8 (A) 11/05/2022    Labs reviewed from 01/03/24 from [facility].     Assessment & Plan Right shoulder arthritis  Persistent pain despite steroid pack and orthopedic consultation. X-ray confirmed arthritis. - Provide contact information for [facility] for follow-up. - Last visit with orthopedics was in 08/2023 -CKD stage IV precludes use of NSAIDs - Use Flexeril  as needed  Hypertension Blood pressure slightly elevated at 147/85 mmHg, likely due to the fact that her afternoon clonidine  doses do - Continue current antihypertensive regimen with amlodipine  and clonidine . - Counseled on blood pressure goal of less than 130/80, low-sodium, DASH diet, medication compliance, 150 minutes of moderate intensity exercise per week. Discussed medication compliance, adverse effects.   Chronic kidney disease, stage IV Chronic kidney disease with creatinine at 2.4 mg/dL.  - Encourage reduction of red meat intake and increase plant-based protein sources. -  Discuss any new supplements or treatments with nephrologist before starting. - Avoid nephrotoxins  Hyperlipidemia On atorvastatin . Last cholesterol check in 2023. - Schedule cholesterol lab test for Wednesday next week. - Low-cholesterol diet        Meds ordered this encounter  Medications   amLODipine  (NORVASC ) 10 MG tablet    Sig: Take 1 tablet (10 mg total) by mouth daily.    Dispense:  90 tablet    Refill:  1   atorvastatin  (LIPITOR) 20 MG tablet    Sig: Take 1 tablet (20 mg total) by mouth daily.    Dispense:  90 tablet    Refill:  1   cloNIDine  (CATAPRES ) 0.1 MG tablet    Sig: Take 1 tablet (0.1 mg total) by mouth 3 (three) times daily.    Dispense:  270 tablet    Refill:  1    Follow-up: No follow-ups on file.       Joaquin Mulberry, MD, FAAFP. [facility] [place], [place] [phone]   01/10/2024, 6:16 PM

## 2024-01-10 NOTE — Patient Instructions (Signed)
 Placed in [facility] Ph# [phone] [address] [place], [place] [address]

## 2024-01-12 ENCOUNTER — Ambulatory Visit: Payer: Self-pay | Attending: Family Medicine

## 2024-01-12 DIAGNOSIS — I129 Hypertensive chronic kidney disease with stage 1 through stage 4 chronic kidney disease, or unspecified chronic kidney disease: Secondary | ICD-10-CM

## 2024-01-13 ENCOUNTER — Ambulatory Visit: Payer: Self-pay | Admitting: Family Medicine

## 2024-01-13 LAB — LP+NON-HDL CHOLESTEROL
Cholesterol, Total: 172 mg/dL (ref 100–199)
HDL: 87 mg/dL (ref >39)
LDL Chol Calc (NIH): 69 mg/dL (ref 0–99)
Total Non-HDL-Chol (LDL+VLDL): 85 mg/dL (ref 0–129)
Triglycerides: 88 mg/dL (ref 0–149)
VLDL Cholesterol Cal: 16 mg/dL (ref 5–40)

## 2024-01-13 LAB — CMP14+EGFR
ALT: 14 IU/L (ref 0–32)
AST: 19 IU/L (ref 0–40)
Albumin: 4.5 g/dL (ref 3.8–4.9)
Alkaline Phosphatase: 115 IU/L (ref 44–121)
BUN/Creatinine Ratio: 13 (ref 12–28)
BUN: 30 mg/dL — ABNORMAL HIGH (ref 8–27)
Bilirubin Total: 0.4 mg/dL (ref 0.0–1.2)
CO2: 18 mmol/L — ABNORMAL LOW (ref 20–29)
Calcium: 9.7 mg/dL (ref 8.7–10.3)
Chloride: 103 mmol/L (ref 96–106)
Creatinine, Ser: 2.36 mg/dL — ABNORMAL HIGH (ref 0.57–1.00)
Globulin, Total: 2.6 g/dL (ref 1.5–4.5)
Glucose: 114 mg/dL — ABNORMAL HIGH (ref 70–99)
Potassium: 4.3 mmol/L (ref 3.5–5.2)
Sodium: 141 mmol/L (ref 134–144)
Total Protein: 7.1 g/dL (ref 6.0–8.5)
eGFR: 23 mL/min/{1.73_m2} — ABNORMAL LOW (ref >59)

## 2024-01-14 ENCOUNTER — Other Ambulatory Visit: Payer: Self-pay

## 2024-01-19 ENCOUNTER — Other Ambulatory Visit: Payer: Self-pay

## 2024-02-28 ENCOUNTER — Other Ambulatory Visit: Payer: Self-pay

## 2024-03-01 ENCOUNTER — Other Ambulatory Visit: Payer: Self-pay

## 2024-04-09 ENCOUNTER — Other Ambulatory Visit: Payer: Self-pay | Admitting: Orthopaedic Surgery

## 2024-04-10 ENCOUNTER — Other Ambulatory Visit: Payer: Self-pay

## 2024-04-12 ENCOUNTER — Other Ambulatory Visit: Payer: Self-pay

## 2024-04-13 LAB — LAB REPORT - SCANNED: EGFR: 21

## 2024-05-09 ENCOUNTER — Other Ambulatory Visit: Payer: Self-pay

## 2024-05-10 ENCOUNTER — Other Ambulatory Visit: Payer: Self-pay

## 2024-05-30 ENCOUNTER — Other Ambulatory Visit: Payer: Self-pay

## 2024-05-31 ENCOUNTER — Other Ambulatory Visit: Payer: Self-pay

## 2024-06-12 ENCOUNTER — Other Ambulatory Visit: Payer: Self-pay

## 2024-06-28 ENCOUNTER — Other Ambulatory Visit: Payer: Self-pay

## 2024-06-28 ENCOUNTER — Other Ambulatory Visit: Payer: Self-pay | Admitting: Family Medicine

## 2024-06-28 DIAGNOSIS — E782 Mixed hyperlipidemia: Secondary | ICD-10-CM

## 2024-06-28 MED ORDER — ATORVASTATIN CALCIUM 20 MG PO TABS
20.0000 mg | ORAL_TABLET | Freq: Every day | ORAL | 0 refills | Status: DC
  Filled 2024-06-28 – 2024-07-31 (×2): qty 30, 30d supply, fill #0

## 2024-07-03 ENCOUNTER — Encounter: Payer: Self-pay | Admitting: Radiology

## 2024-07-10 ENCOUNTER — Other Ambulatory Visit: Payer: Self-pay

## 2024-07-31 ENCOUNTER — Other Ambulatory Visit: Payer: Self-pay

## 2024-08-01 ENCOUNTER — Other Ambulatory Visit: Payer: Self-pay

## 2024-08-15 ENCOUNTER — Other Ambulatory Visit: Payer: Self-pay | Admitting: Family Medicine

## 2024-08-15 ENCOUNTER — Other Ambulatory Visit: Payer: Self-pay

## 2024-08-15 DIAGNOSIS — I129 Hypertensive chronic kidney disease with stage 1 through stage 4 chronic kidney disease, or unspecified chronic kidney disease: Secondary | ICD-10-CM

## 2024-08-15 MED ORDER — AMLODIPINE BESYLATE 10 MG PO TABS
10.0000 mg | ORAL_TABLET | Freq: Every day | ORAL | 1 refills | Status: AC
  Filled 2024-08-15: qty 90, 90d supply, fill #0

## 2024-08-17 ENCOUNTER — Other Ambulatory Visit: Payer: Self-pay

## 2024-08-25 ENCOUNTER — Other Ambulatory Visit: Payer: Self-pay

## 2024-08-25 ENCOUNTER — Other Ambulatory Visit: Payer: Self-pay | Admitting: Family Medicine

## 2024-08-25 DIAGNOSIS — I129 Hypertensive chronic kidney disease with stage 1 through stage 4 chronic kidney disease, or unspecified chronic kidney disease: Secondary | ICD-10-CM

## 2024-08-25 DIAGNOSIS — E782 Mixed hyperlipidemia: Secondary | ICD-10-CM

## 2024-08-25 MED ORDER — ATORVASTATIN CALCIUM 20 MG PO TABS
20.0000 mg | ORAL_TABLET | Freq: Every day | ORAL | 1 refills | Status: AC
  Filled 2024-08-25: qty 90, 90d supply, fill #0

## 2024-08-25 MED ORDER — CLONIDINE HCL 0.1 MG PO TABS
0.1000 mg | ORAL_TABLET | Freq: Three times a day (TID) | ORAL | 0 refills | Status: DC
  Filled 2024-08-25: qty 90, 30d supply, fill #0

## 2024-09-29 ENCOUNTER — Encounter: Payer: Self-pay | Admitting: Nurse Practitioner

## 2024-09-29 ENCOUNTER — Other Ambulatory Visit: Payer: Self-pay

## 2024-09-29 ENCOUNTER — Ambulatory Visit: Payer: Self-pay | Attending: Family Medicine | Admitting: Nurse Practitioner

## 2024-09-29 VITALS — BP 115/80 | HR 77 | Ht 63.0 in | Wt 99.4 lb

## 2024-09-29 DIAGNOSIS — R634 Abnormal weight loss: Secondary | ICD-10-CM

## 2024-09-29 DIAGNOSIS — M25511 Pain in right shoulder: Secondary | ICD-10-CM

## 2024-09-29 MED ORDER — PREDNISONE 10 MG PO TABS: ORAL_TABLET | ORAL | 0 refills | Status: AC

## 2024-09-29 MED ORDER — PREDNISONE 10 MG PO TABS
ORAL_TABLET | ORAL | 3 refills | Status: DC
  Filled 2024-09-29: qty 21, 6d supply, fill #0

## 2024-09-29 NOTE — Patient Instructions (Signed)
 [facility] Located in: [facility] [address] Address: [address], [place], [place] [address] Phone: [phone]

## 2024-09-29 NOTE — Progress Notes (Signed)
 "  Assessment & Plan:  Rebecca Marquez was seen today for weight loss.  Diagnoses and all orders for this visit:  Unintentional weight loss -     Amb ref to Medical Nutrition Therapy-MNT -     HIV antibody (with reflex) -     Hepatitis C antibody -     CBC with Differential -     Thyroid Panel With TSH -     C-reactive protein -     Sedimentation Rate Instructed to schedule mammogram and colonoscopy  Acute pain of right shoulder -     predniSONE  (DELTASONE ) 10 MG tablet; Take 6 tablets (60 mg total) by mouth daily for 1 day, THEN 5 tablets (50 mg total) daily for 1 day, THEN 4 tablets (40 mg total) daily for 1 day, THEN 3 tablets (30 mg total) daily for 1 day, THEN 2 tablets (20 mg total) daily for 1 day, THEN 1 tablet (10 mg total) daily for 1 day.    Patient has been counseled on age-appropriate routine health concerns for screening and prevention. These are reviewed and up-to-date. Referrals have been placed accordingly. Immunizations are up-to-date or declined.    Subjective:   Chief Complaint  Patient presents with   Weight Loss    Unintentional weight loss.    Rebecca Marquez 64 y.o. female presents to office today with concerns of unintentional weight loss.  She is a patient of Dr. Delbert  PMH: hypertension, hyperlipidemia stage IV chronic kidney disease (followed by Nephrology), chronic right shoulder pain   She was instructed to schedule mammogram and colonoscopy. Both of which she is overdue for.    Wt Readings from Last 3 Encounters:  09/29/24 99 lb 6.4 oz (45.1 kg)  01/10/24 105 lb 12.8 oz (48 kg)  07/13/23 115 lb 9.6 oz (52.4 kg)     She has experienced significant weight loss over the past year, dropping from 105 pounds 8 months ago to 99 pounds currently. Prior to 8 months ago she weighed 115lbs. Despite maintaining her usual eating habits, she continues to lose weight. She denies any decrease in appetite, stating she eats frequently, especially at work, consuming  meals such as salads, goulash, shrimp, and burgers. However, at home, she tends to eat less, often only a few bites before losing interest in food. She attributes some of her weight loss to stress and notes that her weight issues began after starting blood pressure medications, specifically clonidine  and amlodipine , around 2015. She occasionally drinks alcohol, but not daily. She has a history of colon polyps and recalls experiencing bleeding in the past but is currently asymptomatic in that regard. She has not had a colonoscopy recently and has a kit at home that she has not used.  She is requesting prednisone  today for her chronic right shoulder pain. She is followed by ortho for this.    Review of Systems  Constitutional:  Positive for weight loss. Negative for fever and malaise/fatigue.  Respiratory: Negative.  Negative for cough and shortness of breath.   Cardiovascular: Negative.  Negative for chest pain, palpitations and leg swelling.  Gastrointestinal: Negative.  Negative for abdominal pain, blood in stool, constipation, diarrhea, melena, nausea and vomiting.  Genitourinary: Negative.   Musculoskeletal:  Positive for joint pain.  Skin: Negative.   Neurological: Negative.  Negative for dizziness, focal weakness, seizures and headaches.  Psychiatric/Behavioral: Negative.  Negative for suicidal ideas.     Past Medical History:  Diagnosis Date   Hypertension Dx March  2015   Kidney disease    Renal disorder    R/T HTN   Seasonal allergies     Past Surgical History:  Procedure Laterality Date   TUBAL LIGATION      Family History  Problem Relation Age of Onset   Diabetes Mother    Arthritis Mother    Multiple sclerosis Daughter 59   Colon cancer Neg Hx    Colon polyps Neg Hx    Esophageal cancer Neg Hx    Stomach cancer Neg Hx    Rectal cancer Neg Hx     Social History Reviewed with no changes to be made today.   Outpatient Medications Prior to Visit  Medication Sig  Dispense Refill   amLODipine  (NORVASC ) 10 MG tablet Take 1 tablet (10 mg total) by mouth daily. 90 tablet 1   atorvastatin  (LIPITOR) 20 MG tablet Take 1 tablet (20 mg total) by mouth daily. Must have office visit for refills 90 tablet 1   cloNIDine  (CATAPRES ) 0.1 MG tablet Take 1 tablet (0.1 mg total) by mouth 3 (three) times daily. 90 tablet 0   diclofenac  Sodium (VOLTAREN ) 1 % GEL Apply 4 g topically 4 (four) times daily. (Patient not taking: Reported on 09/29/2024) 100 g 1   lidocaine  (LIDODERM ) 5 % Place 1 patch onto the skin daily. Remove & Discard patch within 12 hours or as directed by MD (Patient not taking: Reported on 09/29/2024) 90 patch 0   ondansetron  (ZOFRAN -ODT) 4 MG disintegrating tablet Take 1 tablet (4 mg total) by mouth every 8 (eight) hours as needed for nausea or vomiting. (Patient not taking: Reported on 09/29/2024) 20 tablet 0   traMADol  (ULTRAM ) 50 MG tablet Take 1 tablet (50 mg total) by mouth every 12 (twelve) hours as needed. (Patient not taking: Reported on 09/29/2024) 30 tablet 0   cyclobenzaprine  (FLEXERIL ) 5 MG tablet Take 1 tablet (5 mg total) by mouth at bedtime. (Patient not taking: Reported on 09/29/2024) 30 tablet 1   predniSONE  (STERAPRED UNI-PAK 21 TAB) 10 MG (21) TBPK tablet Take as directed (Patient not taking: Reported on 09/29/2024) 21 tablet 3   No facility-administered medications prior to visit.    Allergies[1]     Objective:    BP 115/80 (BP Location: Left Arm, Patient Position: Sitting, Cuff Size: Normal)   Pulse 77   Ht 5' 3 (1.6 m)   Wt 99 lb 6.4 oz (45.1 kg)   SpO2 98%   BMI 17.61 kg/m  Wt Readings from Last 3 Encounters:  09/29/24 99 lb 6.4 oz (45.1 kg)  01/10/24 105 lb 12.8 oz (48 kg)  07/13/23 115 lb 9.6 oz (52.4 kg)    Physical Exam Vitals and nursing note reviewed.  Constitutional:      Appearance: She is well-developed and underweight. She is not ill-appearing or toxic-appearing.  HENT:     Head: Normocephalic and atraumatic.   Cardiovascular:     Rate and Rhythm: Normal rate and regular rhythm.     Heart sounds: Normal heart sounds. No murmur heard.    No friction rub. No gallop.  Pulmonary:     Effort: Pulmonary effort is normal. No tachypnea or respiratory distress.     Breath sounds: Normal breath sounds. No decreased breath sounds, wheezing, rhonchi or rales.  Chest:     Chest wall: No tenderness.  Musculoskeletal:        General: Normal range of motion.     Cervical back: Normal range of motion.  Skin:  General: Skin is warm and dry.  Neurological:     Mental Status: She is alert and oriented to person, place, and time.     Coordination: Coordination normal.  Psychiatric:        Behavior: Behavior normal. Behavior is cooperative.        Thought Content: Thought content normal.        Judgment: Judgment normal.          Patient has been counseled extensively about nutrition and exercise as well as the importance of adherence with medications and regular follow-up. The patient was given clear instructions to go to ER or return to medical center if symptoms don't improve, worsen or new problems develop. The patient verbalized understanding.   Follow-up: Return if symptoms worsen or fail to improve.   Haze LELON Servant, FNP-BC [facility] [place], [place] [phone]   09/29/2024, 9:52 AM    [1]  Allergies Allergen Reactions   Nsaids Other (See Comments)    ckd   Losartan  Tinitus   "

## 2024-09-30 LAB — CBC WITH DIFFERENTIAL/PLATELET
Basophils Absolute: 0 10*3/uL (ref 0.0–0.2)
Basos: 1 %
EOS (ABSOLUTE): 0.2 10*3/uL (ref 0.0–0.4)
Eos: 4 %
Hematocrit: 41.2 % (ref 34.0–46.6)
Hemoglobin: 13.8 g/dL (ref 11.1–15.9)
Immature Grans (Abs): 0 10*3/uL (ref 0.0–0.1)
Immature Granulocytes: 0 %
Lymphocytes Absolute: 1.2 10*3/uL (ref 0.7–3.1)
Lymphs: 25 %
MCH: 33.3 pg — ABNORMAL HIGH (ref 26.6–33.0)
MCHC: 33.5 g/dL (ref 31.5–35.7)
MCV: 99 fL — ABNORMAL HIGH (ref 79–97)
Monocytes Absolute: 0.5 10*3/uL (ref 0.1–0.9)
Monocytes: 9 %
Neutrophils Absolute: 3 10*3/uL (ref 1.4–7.0)
Neutrophils: 61 %
Platelets: 222 10*3/uL (ref 150–450)
RBC: 4.15 x10E6/uL (ref 3.77–5.28)
RDW: 14.2 % (ref 11.7–15.4)
WBC: 4.9 10*3/uL (ref 3.4–10.8)

## 2024-09-30 LAB — C-REACTIVE PROTEIN: CRP: 3 mg/L (ref 0–10)

## 2024-09-30 LAB — THYROID PANEL WITH TSH
Free Thyroxine Index: 1.7 (ref 1.2–4.9)
T3 Uptake Ratio: 29 % (ref 24–39)
T4, Total: 5.8 ug/dL (ref 4.5–12.0)
TSH: 2.45 u[IU]/mL (ref 0.450–4.500)

## 2024-09-30 LAB — SEDIMENTATION RATE: Sed Rate: 10 mm/h (ref 0–40)

## 2024-09-30 LAB — HIV ANTIBODY (ROUTINE TESTING W REFLEX): HIV Screen 4th Generation wRfx: NONREACTIVE

## 2024-09-30 LAB — HEPATITIS C ANTIBODY: Hep C Virus Ab: NONREACTIVE

## 2024-10-03 ENCOUNTER — Other Ambulatory Visit: Payer: Self-pay

## 2024-10-03 ENCOUNTER — Other Ambulatory Visit: Payer: Self-pay | Admitting: Family Medicine

## 2024-10-03 DIAGNOSIS — I129 Hypertensive chronic kidney disease with stage 1 through stage 4 chronic kidney disease, or unspecified chronic kidney disease: Secondary | ICD-10-CM

## 2024-10-03 MED ORDER — CLONIDINE HCL 0.1 MG PO TABS
0.1000 mg | ORAL_TABLET | Freq: Three times a day (TID) | ORAL | 0 refills | Status: AC
  Filled 2024-10-03: qty 90, 30d supply, fill #0

## 2024-10-04 ENCOUNTER — Other Ambulatory Visit: Payer: Self-pay

## 2024-10-13 ENCOUNTER — Ambulatory Visit: Payer: Self-pay | Admitting: Nurse Practitioner

## 2024-10-17 ENCOUNTER — Encounter: Admitting: Dietician
